# Patient Record
Sex: Male | Born: 1940 | Race: White | Hispanic: No | Marital: Married | Smoking: Former smoker
Health system: Southern US, Community
[De-identification: ages and names within clinical notes are randomized; demographics above are authoritative.]

## PROBLEM LIST (undated history)

## (undated) DIAGNOSIS — R569 Unspecified convulsions: Secondary | ICD-10-CM

## (undated) DIAGNOSIS — M549 Dorsalgia, unspecified: Secondary | ICD-10-CM

## (undated) DIAGNOSIS — I2510 Atherosclerotic heart disease of native coronary artery without angina pectoris: Secondary | ICD-10-CM

## (undated) DIAGNOSIS — I6529 Occlusion and stenosis of unspecified carotid artery: Secondary | ICD-10-CM

## (undated) DIAGNOSIS — G4700 Insomnia, unspecified: Secondary | ICD-10-CM

## (undated) DIAGNOSIS — N529 Male erectile dysfunction, unspecified: Secondary | ICD-10-CM

## (undated) DIAGNOSIS — F418 Other specified anxiety disorders: Secondary | ICD-10-CM

## (undated) DIAGNOSIS — N400 Enlarged prostate without lower urinary tract symptoms: Secondary | ICD-10-CM

## (undated) DIAGNOSIS — Z87891 Personal history of nicotine dependence: Secondary | ICD-10-CM

## (undated) DIAGNOSIS — G8929 Other chronic pain: Secondary | ICD-10-CM

## (undated) DIAGNOSIS — R918 Other nonspecific abnormal finding of lung field: Secondary | ICD-10-CM

## (undated) DIAGNOSIS — E785 Hyperlipidemia, unspecified: Secondary | ICD-10-CM

## (undated) DIAGNOSIS — R413 Other amnesia: Principal | ICD-10-CM

## (undated) DIAGNOSIS — R233 Spontaneous ecchymoses: Secondary | ICD-10-CM

## (undated) DIAGNOSIS — R238 Other skin changes: Secondary | ICD-10-CM

## (undated) DIAGNOSIS — M2550 Pain in unspecified joint: Secondary | ICD-10-CM

## (undated) HISTORY — DX: Male erectile dysfunction, unspecified: N52.9

## (undated) HISTORY — DX: Other nonspecific abnormal finding of lung field: R91.8

## (undated) HISTORY — PX: ADENOIDECTOMY: SUR15

## (undated) HISTORY — DX: Other amnesia: R41.3

## (undated) HISTORY — PX: COLONOSCOPY: SHX174

## (undated) HISTORY — PX: CARDIAC CATHETERIZATION: SHX172

## (undated) HISTORY — PX: OTHER SURGICAL HISTORY: SHX169

## (undated) HISTORY — DX: Occlusion and stenosis of unspecified carotid artery: I65.29

## (undated) HISTORY — PX: CERVICAL DISC SURGERY: SHX588

## (undated) HISTORY — PX: ANKLE FRACTURE SURGERY: SHX122

---

## 1999-06-13 ENCOUNTER — Encounter: Admission: RE | Admit: 1999-06-13 | Discharge: 1999-06-13 | Payer: Self-pay | Admitting: Family Medicine

## 1999-06-13 ENCOUNTER — Encounter: Payer: Self-pay | Admitting: Family Medicine

## 1999-07-29 ENCOUNTER — Emergency Department (HOSPITAL_COMMUNITY): Admission: EM | Admit: 1999-07-29 | Discharge: 1999-07-29 | Payer: Self-pay | Admitting: *Deleted

## 1999-08-10 ENCOUNTER — Encounter: Payer: Self-pay | Admitting: Neurology

## 1999-08-10 ENCOUNTER — Ambulatory Visit (HOSPITAL_COMMUNITY): Admission: RE | Admit: 1999-08-10 | Discharge: 1999-08-10 | Payer: Self-pay | Admitting: Neurology

## 2001-04-09 ENCOUNTER — Encounter: Payer: Self-pay | Admitting: Family Medicine

## 2001-04-09 LAB — CONVERTED CEMR LAB

## 2005-01-02 ENCOUNTER — Ambulatory Visit: Payer: Self-pay | Admitting: Family Medicine

## 2005-01-02 ENCOUNTER — Ambulatory Visit (HOSPITAL_COMMUNITY): Admission: RE | Admit: 2005-01-02 | Discharge: 2005-01-02 | Payer: Self-pay | Admitting: Family Medicine

## 2005-05-31 ENCOUNTER — Ambulatory Visit: Payer: Self-pay | Admitting: Family Medicine

## 2007-01-17 ENCOUNTER — Encounter: Payer: Self-pay | Admitting: Family Medicine

## 2007-01-17 DIAGNOSIS — F329 Major depressive disorder, single episode, unspecified: Secondary | ICD-10-CM

## 2007-08-20 ENCOUNTER — Ambulatory Visit (HOSPITAL_COMMUNITY): Admission: RE | Admit: 2007-08-20 | Discharge: 2007-08-20 | Payer: Self-pay | Admitting: Neurological Surgery

## 2007-08-20 HISTORY — PX: OTHER SURGICAL HISTORY: SHX169

## 2007-09-23 ENCOUNTER — Encounter: Admission: RE | Admit: 2007-09-23 | Discharge: 2007-09-23 | Payer: Self-pay | Admitting: Neurological Surgery

## 2007-11-21 ENCOUNTER — Encounter: Admission: RE | Admit: 2007-11-21 | Discharge: 2007-11-21 | Payer: Self-pay | Admitting: Neurological Surgery

## 2007-12-26 ENCOUNTER — Ambulatory Visit: Payer: Self-pay | Admitting: Family Medicine

## 2007-12-26 LAB — CONVERTED CEMR LAB
ALT: 18 units/L (ref 0–53)
AST: 27 units/L (ref 0–37)
Albumin: 3.8 g/dL (ref 3.5–5.2)
Alkaline Phosphatase: 61 units/L (ref 39–117)
BUN: 10 mg/dL (ref 6–23)
Basophils Relative: 1.1 % (ref 0.0–3.0)
Chloride: 113 meq/L — ABNORMAL HIGH (ref 96–112)
Creatinine, Ser: 0.9 mg/dL (ref 0.4–1.5)
Eosinophils Relative: 5.6 % — ABNORMAL HIGH (ref 0.0–5.0)
Glucose, Bld: 74 mg/dL (ref 70–99)
HCT: 45 % (ref 39.0–52.0)
HDL: 32.8 mg/dL — ABNORMAL LOW (ref >39.0)
Monocytes Relative: 10 % (ref 3.0–12.0)
Neutrophils Relative %: 49.6 % (ref 43.0–77.0)
Nitrite: NEGATIVE
Platelets: 206 10*3/uL (ref 150–400)
Potassium: 3.3 meq/L — ABNORMAL LOW (ref 3.5–5.1)
RBC: 4.45 M/uL (ref 4.22–5.81)
Specific Gravity, Urine: 1.025
Total CHOL/HDL Ratio: 6.2
Total Protein: 7.2 g/dL (ref 6.0–8.3)
Triglycerides: 79 mg/dL (ref 0–149)
Urobilinogen, UA: 1
WBC Urine, dipstick: NEGATIVE
WBC: 6.6 10*3/uL (ref 4.5–10.5)

## 2008-01-05 ENCOUNTER — Ambulatory Visit: Payer: Self-pay | Admitting: Family Medicine

## 2008-01-05 DIAGNOSIS — F528 Other sexual dysfunction not due to a substance or known physiological condition: Secondary | ICD-10-CM

## 2008-01-06 ENCOUNTER — Telehealth: Payer: Self-pay | Admitting: *Deleted

## 2008-01-14 ENCOUNTER — Telehealth: Payer: Self-pay | Admitting: *Deleted

## 2008-01-20 ENCOUNTER — Ambulatory Visit: Payer: Self-pay | Admitting: Family Medicine

## 2008-01-30 ENCOUNTER — Telehealth (INDEPENDENT_AMBULATORY_CARE_PROVIDER_SITE_OTHER): Payer: Self-pay | Admitting: *Deleted

## 2008-02-10 ENCOUNTER — Ambulatory Visit: Payer: Self-pay | Admitting: Internal Medicine

## 2008-02-23 ENCOUNTER — Encounter: Admission: RE | Admit: 2008-02-23 | Discharge: 2008-02-23 | Payer: Self-pay | Admitting: Neurological Surgery

## 2008-02-26 ENCOUNTER — Telehealth: Payer: Self-pay | Admitting: Internal Medicine

## 2008-02-27 ENCOUNTER — Ambulatory Visit: Payer: Self-pay | Admitting: Internal Medicine

## 2008-04-15 ENCOUNTER — Telehealth: Payer: Self-pay | Admitting: *Deleted

## 2008-11-02 ENCOUNTER — Ambulatory Visit: Payer: Self-pay | Admitting: Family Medicine

## 2008-11-02 DIAGNOSIS — R5381 Other malaise: Secondary | ICD-10-CM

## 2008-11-02 DIAGNOSIS — R5383 Other fatigue: Secondary | ICD-10-CM

## 2008-11-02 DIAGNOSIS — K4090 Unilateral inguinal hernia, without obstruction or gangrene, not specified as recurrent: Secondary | ICD-10-CM | POA: Insufficient documentation

## 2008-11-03 LAB — CONVERTED CEMR LAB
Albumin: 4.2 g/dL (ref 3.5–5.2)
Alkaline Phosphatase: 63 units/L (ref 39–117)
BUN: 9 mg/dL (ref 6–23)
Basophils Absolute: 0 10*3/uL (ref 0.0–0.1)
CO2: 27 meq/L (ref 19–32)
Calcium: 9.3 mg/dL (ref 8.4–10.5)
Creatinine, Ser: 0.9 mg/dL (ref 0.4–1.5)
Eosinophils Absolute: 0.3 10*3/uL (ref 0.0–0.7)
Glucose, Bld: 83 mg/dL (ref 70–99)
HDL: 29.2 mg/dL — ABNORMAL LOW (ref >39.00)
Hemoglobin: 17 g/dL (ref 13.0–17.0)
Lymphocytes Relative: 47.3 % — ABNORMAL HIGH (ref 12.0–46.0)
MCHC: 34.9 g/dL (ref 30.0–36.0)
Neutro Abs: 2 10*3/uL (ref 1.4–7.7)
Neutrophils Relative %: 36.3 % — ABNORMAL LOW (ref 43.0–77.0)
Platelets: 183 10*3/uL (ref 150.0–400.0)
RDW: 12.5 % (ref 11.5–14.6)
Saturation Ratios: 50.6 % — ABNORMAL HIGH (ref 20.0–50.0)
T3, Free: 2.9 pg/mL (ref 2.3–4.2)
TSH: 4.26 microintl units/mL (ref 0.35–5.50)
Total CHOL/HDL Ratio: 6
Transferrin: 220.3 mg/dL (ref 212.0–360.0)
Triglycerides: 153 mg/dL — ABNORMAL HIGH (ref 0.0–149.0)

## 2008-12-24 ENCOUNTER — Ambulatory Visit (HOSPITAL_COMMUNITY): Admission: RE | Admit: 2008-12-24 | Discharge: 2008-12-24 | Payer: Self-pay | Admitting: General Surgery

## 2008-12-24 HISTORY — PX: OTHER SURGICAL HISTORY: SHX169

## 2009-04-13 ENCOUNTER — Ambulatory Visit: Payer: Self-pay | Admitting: Family Medicine

## 2009-04-13 LAB — CONVERTED CEMR LAB
Glucose, Urine, Semiquant: NEGATIVE
Specific Gravity, Urine: 1.025
WBC Urine, dipstick: NEGATIVE
pH: 5.5

## 2009-04-14 DIAGNOSIS — R4182 Altered mental status, unspecified: Secondary | ICD-10-CM | POA: Insufficient documentation

## 2009-04-14 LAB — CONVERTED CEMR LAB
ALT: 24 units/L (ref 0–53)
Alkaline Phosphatase: 59 units/L (ref 39–117)
Basophils Relative: 0.4 % (ref 0.0–3.0)
Bilirubin, Direct: 0.1 mg/dL (ref 0.0–0.3)
Calcium: 9.8 mg/dL (ref 8.4–10.5)
Creatinine, Ser: 1.1 mg/dL (ref 0.4–1.5)
Eosinophils Absolute: 0.1 10*3/uL (ref 0.0–0.7)
Eosinophils Relative: 2.4 % (ref 0.0–5.0)
Folate: 19.3 ng/mL
Lymphocytes Relative: 33.7 % (ref 12.0–46.0)
Neutrophils Relative %: 52.9 % (ref 43.0–77.0)
PSA: 0.63 ng/mL (ref 0.10–4.00)
RBC: 4.99 M/uL (ref 4.22–5.81)
Saturation Ratios: 51.9 % — ABNORMAL HIGH (ref 20.0–50.0)
T3, Free: 2.8 pg/mL (ref 2.3–4.2)
Total Protein: 8.6 g/dL — ABNORMAL HIGH (ref 6.0–8.3)
WBC: 5.3 10*3/uL (ref 4.5–10.5)

## 2009-04-15 ENCOUNTER — Ambulatory Visit: Payer: Self-pay | Admitting: Family Medicine

## 2009-04-15 DIAGNOSIS — E538 Deficiency of other specified B group vitamins: Secondary | ICD-10-CM | POA: Insufficient documentation

## 2009-04-22 ENCOUNTER — Encounter: Admission: RE | Admit: 2009-04-22 | Discharge: 2009-04-22 | Payer: Self-pay | Admitting: Family Medicine

## 2009-05-05 ENCOUNTER — Encounter: Payer: Self-pay | Admitting: Family Medicine

## 2009-05-19 ENCOUNTER — Ambulatory Visit: Payer: Self-pay | Admitting: Family Medicine

## 2009-05-23 ENCOUNTER — Ambulatory Visit: Payer: Self-pay | Admitting: Psychology

## 2009-05-24 ENCOUNTER — Ambulatory Visit: Payer: Self-pay | Admitting: Family Medicine

## 2009-10-18 ENCOUNTER — Encounter: Admission: RE | Admit: 2009-10-18 | Discharge: 2009-10-18 | Payer: Self-pay | Admitting: Neurological Surgery

## 2009-10-18 ENCOUNTER — Encounter: Payer: Self-pay | Admitting: Family Medicine

## 2010-03-15 ENCOUNTER — Encounter
Admission: RE | Admit: 2010-03-15 | Discharge: 2010-03-15 | Payer: Self-pay | Source: Home / Self Care | Admitting: Physical Medicine and Rehabilitation

## 2010-03-24 ENCOUNTER — Telehealth: Payer: Self-pay | Admitting: Family Medicine

## 2010-03-27 ENCOUNTER — Ambulatory Visit (HOSPITAL_COMMUNITY)
Admission: RE | Admit: 2010-03-27 | Discharge: 2010-03-27 | Payer: Self-pay | Source: Home / Self Care | Attending: Physical Medicine and Rehabilitation | Admitting: Physical Medicine and Rehabilitation

## 2010-03-27 ENCOUNTER — Ambulatory Visit: Payer: Self-pay | Admitting: Family Medicine

## 2010-03-27 ENCOUNTER — Encounter: Payer: Self-pay | Admitting: Family Medicine

## 2010-04-04 ENCOUNTER — Encounter
Admission: RE | Admit: 2010-04-04 | Discharge: 2010-04-04 | Payer: Self-pay | Source: Home / Self Care | Attending: Physical Medicine and Rehabilitation | Admitting: Physical Medicine and Rehabilitation

## 2010-04-14 ENCOUNTER — Telehealth: Payer: Self-pay | Admitting: Family Medicine

## 2010-04-19 ENCOUNTER — Ambulatory Visit: Payer: Self-pay | Admitting: Oncology

## 2010-05-02 ENCOUNTER — Ambulatory Visit: Admit: 2010-05-02 | Payer: Self-pay | Admitting: Family Medicine

## 2010-05-02 ENCOUNTER — Inpatient Hospital Stay (HOSPITAL_COMMUNITY)
Admission: EM | Admit: 2010-05-02 | Discharge: 2010-05-08 | Payer: Self-pay | Source: Home / Self Care | Attending: Internal Medicine | Admitting: Internal Medicine

## 2010-05-03 LAB — CBC
Hemoglobin: 13.6 g/dL (ref 13.0–17.0)
MCH: 32.8 pg (ref 26.0–34.0)
MCH: 32.7 pg (ref 26.0–34.0)
MCHC: 35 g/dL (ref 30.0–36.0)
MCV: 93.2 fL (ref 78.0–100.0)
MCV: 93.5 fL (ref 78.0–100.0)
Platelets: 226 10*3/uL (ref 150–400)
RBC: 4.55 MIL/uL (ref 4.22–5.81)
RBC: 4.27 MIL/uL (ref 4.22–5.81)
RBC: 4.16 MIL/uL — ABNORMAL LOW (ref 4.22–5.81)
RDW: 13.5 % (ref 11.5–15.5)
WBC: 13.5 10*3/uL — ABNORMAL HIGH (ref 4.0–10.5)
WBC: 14.1 10*3/uL — ABNORMAL HIGH (ref 4.0–10.5)

## 2010-05-03 LAB — COMPREHENSIVE METABOLIC PANEL
ALT: 15 U/L (ref 0–53)
AST: 22 U/L (ref 0–37)
AST: 26 U/L (ref 0–37)
Albumin: 3 g/dL — ABNORMAL LOW (ref 3.5–5.2)
BUN: 9 mg/dL (ref 6–23)
Calcium: 9.4 mg/dL (ref 8.4–10.5)
Calcium: 9 mg/dL (ref 8.4–10.5)
Chloride: 102 mEq/L (ref 96–112)
Creatinine, Ser: 1.09 mg/dL (ref 0.4–1.5)
Creatinine, Ser: 0.98 mg/dL (ref 0.4–1.5)
GFR calc Af Amer: >60 mL/min (ref >60)
GFR calc non Af Amer: >60 mL/min (ref >60)
Glucose, Bld: 111 mg/dL — ABNORMAL HIGH (ref 70–99)
Potassium: 3.7 mEq/L (ref 3.5–5.1)
Sodium: 135 mEq/L (ref 135–145)
Total Bilirubin: 1.7 mg/dL — ABNORMAL HIGH (ref 0.3–1.2)
Total Protein: 7.5 g/dL (ref 6.0–8.3)

## 2010-05-03 LAB — BLOOD GAS, VENOUS
Acid-base deficit: 1.9 mmol/L (ref 0.0–2.0)
Bicarbonate: 20.2 mEq/L (ref 20.0–24.0)
FIO2: 0.21 %
O2 Saturation: 90 %
Patient temperature: 98.6
TCO2: 17.6 mmol/L (ref 0–100)
pO2, Ven: 55 mmHg — ABNORMAL HIGH (ref 30.0–45.0)

## 2010-05-03 LAB — DIFFERENTIAL
Basophils Absolute: 0 10*3/uL (ref 0.0–0.1)
Basophils Absolute: 0 10*3/uL (ref 0.0–0.1)
Basophils Relative: 0 % (ref 0–1)
Eosinophils Absolute: 0 10*3/uL (ref 0.0–0.7)
Eosinophils Relative: 0 % (ref 0–5)
Monocytes Absolute: 1.1 10*3/uL — ABNORMAL HIGH (ref 0.1–1.0)
Monocytes Absolute: 1.1 10*3/uL — ABNORMAL HIGH (ref 0.1–1.0)
Monocytes Relative: 8 % (ref 3–12)
Monocytes Relative: 8 % (ref 3–12)
Neutro Abs: 11.8 10*3/uL — ABNORMAL HIGH (ref 1.7–7.7)
Neutrophils Relative %: 83 % — ABNORMAL HIGH (ref 43–77)
Neutrophils Relative %: 84 % — ABNORMAL HIGH (ref 43–77)

## 2010-05-03 LAB — URINALYSIS, ROUTINE W REFLEX MICROSCOPIC
Bilirubin Urine: NEGATIVE
Hgb urine dipstick: NEGATIVE
Ketones, ur: >80 mg/dL — AB
Specific Gravity, Urine: 1.019 (ref 1.005–1.030)
Urine Glucose, Fasting: NEGATIVE mg/dL
pH: 6 (ref 5.0–8.0)

## 2010-05-03 LAB — POCT I-STAT, CHEM 8
BUN: 7 mg/dL (ref 6–23)
Chloride: 108 mEq/L (ref 96–112)
Glucose, Bld: 116 mg/dL — ABNORMAL HIGH (ref 70–99)
HCT: 42 % (ref 39.0–52.0)
Hemoglobin: 14.3 g/dL (ref 13.0–17.0)

## 2010-05-04 HISTORY — PX: OTHER SURGICAL HISTORY: SHX169

## 2010-05-04 LAB — MAGNESIUM: Magnesium: 2 mg/dL (ref 1.5–2.5)

## 2010-05-04 LAB — COMPREHENSIVE METABOLIC PANEL
Alkaline Phosphatase: 65 U/L (ref 39–117)
BUN: 4 mg/dL — ABNORMAL LOW (ref 6–23)
CO2: 27 mEq/L (ref 19–32)
Calcium: 8.8 mg/dL (ref 8.4–10.5)
GFR calc non Af Amer: >60 mL/min (ref >60)
Glucose, Bld: 97 mg/dL (ref 70–99)
Total Protein: 6.9 g/dL (ref 6.0–8.3)

## 2010-05-04 LAB — DIFFERENTIAL
Basophils Absolute: 0 10*3/uL (ref 0.0–0.1)
Basophils Relative: 0 % (ref 0–1)
Eosinophils Relative: 1 % (ref 0–5)
Lymphocytes Relative: 15 % (ref 12–46)
Lymphs Abs: 1.9 10*3/uL (ref 0.7–4.0)
Monocytes Relative: 6 % (ref 3–12)
Neutro Abs: 9.4 10*3/uL — ABNORMAL HIGH (ref 1.7–7.7)
Neutrophils Relative %: 77 % (ref 43–77)

## 2010-05-04 LAB — CBC
HCT: 38.3 % — ABNORMAL LOW (ref 39.0–52.0)
Hemoglobin: 13.3 g/dL (ref 13.0–17.0)
MCHC: 34.7 g/dL (ref 30.0–36.0)
MCV: 93.2 fL (ref 78.0–100.0)
RDW: 13.8 % (ref 11.5–15.5)

## 2010-05-04 LAB — PHOSPHORUS: Phosphorus: 2.9 mg/dL (ref 2.3–4.6)

## 2010-05-05 LAB — BASIC METABOLIC PANEL
CO2: 27 mEq/L (ref 19–32)
Calcium: 8.6 mg/dL (ref 8.4–10.5)
Creatinine, Ser: 0.86 mg/dL (ref 0.4–1.5)
GFR calc Af Amer: >60 mL/min (ref >60)
GFR calc non Af Amer: >60 mL/min (ref >60)
Sodium: 143 mEq/L (ref 135–145)

## 2010-05-05 LAB — DIFFERENTIAL
Basophils Absolute: 0 10*3/uL (ref 0.0–0.1)
Basophils Relative: 0 % (ref 0–1)
Eosinophils Absolute: 0.4 10*3/uL (ref 0.0–0.7)
Monocytes Absolute: 0.6 10*3/uL (ref 0.1–1.0)
Monocytes Relative: 8 % (ref 3–12)
Neutro Abs: 5.8 10*3/uL (ref 1.7–7.7)

## 2010-05-05 LAB — CBC
Hemoglobin: 12.6 g/dL — ABNORMAL LOW (ref 13.0–17.0)
MCH: 32.5 pg (ref 26.0–34.0)
MCHC: 34.8 g/dL (ref 30.0–36.0)
Platelets: 216 10*3/uL (ref 150–400)
RDW: 14 % (ref 11.5–15.5)

## 2010-05-05 LAB — WOUND CULTURE

## 2010-05-05 LAB — VANCOMYCIN, TROUGH: Vancomycin Tr: 13.2 ug/mL (ref 10.0–20.0)

## 2010-05-05 LAB — MAGNESIUM: Magnesium: 2 mg/dL (ref 1.5–2.5)

## 2010-05-05 LAB — PHOSPHORUS: Phosphorus: 2.7 mg/dL (ref 2.3–4.6)

## 2010-05-06 LAB — CBC
Hemoglobin: 12.9 g/dL — ABNORMAL LOW (ref 13.0–17.0)
Platelets: 250 10*3/uL (ref 150–400)
RBC: 4.02 MIL/uL — ABNORMAL LOW (ref 4.22–5.81)
WBC: 6 10*3/uL (ref 4.0–10.5)

## 2010-05-06 LAB — DIFFERENTIAL
Basophils Absolute: 0 10*3/uL (ref 0.0–0.1)
Basophils Relative: 1 % (ref 0–1)
Eosinophils Absolute: 0.4 10*3/uL (ref 0.0–0.7)
Neutro Abs: 2.8 10*3/uL (ref 1.7–7.7)
Neutrophils Relative %: 47 % (ref 43–77)

## 2010-05-06 LAB — BASIC METABOLIC PANEL
CO2: 28 mEq/L (ref 19–32)
Chloride: 109 mEq/L (ref 96–112)
GFR calc Af Amer: >60 mL/min (ref >60)
Sodium: 144 mEq/L (ref 135–145)

## 2010-05-06 LAB — PHOSPHORUS: Phosphorus: 2.8 mg/dL (ref 2.3–4.6)

## 2010-05-06 LAB — MAGNESIUM: Magnesium: 2.1 mg/dL (ref 1.5–2.5)

## 2010-05-07 LAB — CBC
MCV: 91.8 fL (ref 78.0–100.0)
Platelets: 271 10*3/uL (ref 150–400)
RBC: 4.04 MIL/uL — ABNORMAL LOW (ref 4.22–5.81)
RDW: 13.8 % (ref 11.5–15.5)
WBC: 6.3 10*3/uL (ref 4.0–10.5)

## 2010-05-07 LAB — CONVERTED CEMR LAB
Beta Globulin: 5.7 % (ref 4.7–7.2)
Bilirubin, Direct: 0.1 mg/dL (ref 0.0–0.3)
Folate: 13.3 ng/mL
Gamma Globulin: 22.5 % — ABNORMAL HIGH (ref 11.1–18.8)
Iron: 73 ug/dL (ref 42–165)
Total Protein, Serum Electrophoresis: 8.1 g/dL (ref 6.0–8.3)
Total Protein: 7.8 g/dL (ref 6.0–8.3)
Vitamin B-12: 669 pg/mL (ref 211–911)

## 2010-05-07 LAB — DIFFERENTIAL
Basophils Absolute: 0.1 10*3/uL (ref 0.0–0.1)
Eosinophils Absolute: 0.3 10*3/uL (ref 0.0–0.7)
Lymphocytes Relative: 34 % (ref 12–46)
Monocytes Absolute: 0.9 10*3/uL (ref 0.1–1.0)
Neutrophils Relative %: 46 % (ref 43–77)

## 2010-05-07 LAB — WOUND CULTURE: Culture: NO GROWTH

## 2010-05-07 LAB — BASIC METABOLIC PANEL
BUN: 1 mg/dL — ABNORMAL LOW (ref 6–23)
Chloride: 106 mEq/L (ref 96–112)
GFR calc Af Amer: >60 mL/min (ref >60)
GFR calc non Af Amer: >60 mL/min (ref >60)
Potassium: 2.8 mEq/L — ABNORMAL LOW (ref 3.5–5.1)

## 2010-05-07 LAB — MAGNESIUM: Magnesium: 2 mg/dL (ref 1.5–2.5)

## 2010-05-07 LAB — PHOSPHORUS: Phosphorus: 3.1 mg/dL (ref 2.3–4.6)

## 2010-05-08 LAB — BASIC METABOLIC PANEL
BUN: 2 mg/dL — ABNORMAL LOW (ref 6–23)
Calcium: 8.9 mg/dL (ref 8.4–10.5)
GFR calc non Af Amer: >60 mL/min (ref >60)
Glucose, Bld: 103 mg/dL — ABNORMAL HIGH (ref 70–99)
Potassium: 3.4 mEq/L — ABNORMAL LOW (ref 3.5–5.1)
Sodium: 141 mEq/L (ref 135–145)

## 2010-05-08 NOTE — Discharge Summary (Signed)
NAME:  Tyler Mayo, Tyler Mayo NO.:  [id number]  MEDICAL RECORD NO.:  [id number]          PATIENT TYPE:  INP  LOCATION:  1412                         FACILITY:  [facility] ([place])  PHYSICIAN:  Rock Nephew, MD       DATE OF BIRTH:  1940-12-05  DATE OF ADMISSION:  05/02/2010 DATE OF DISCHARGE:  05/08/2010                        DISCHARGE SUMMARY - REFERRING   PRIMARY CARE PHYSICIAN:  Tinnie Gens A. Tawanna Cooler, MD.  DISCHARGE DIAGNOSES:  Right thigh abscess status post incision and drainage, resolved systemic inflammatory response syndrome with secondary infection, Vertebral fracture, anxiety and depression, delirium with metabolic encephalopathy resolved secondary to SIRS, history of TIA discharged on a baby aspirin.  DISCHARGE MEDICATIONS:  For the patient are as follows: 1. Aspirin 81 mg p.o. q. daily. 2. Bactrim double-strength 1 tablet p.o. twice daily for 7 days. 3. Vicodin 5/500 mg 1 tablet by mouth every 8 hours as needed pain. 4. Klonopin 0.5 mg 1 tablet by mouth 3 times a day. 5. Multivitamins 1 tablet p.o. q. daily. 6. Nuedexta 1 tablet by mouth daily. 7. Sleep aid. 8. Diphenhydramine 50 mg 1 tablet by mouth daily at bedtime. 9. Visine lubricant eye drop, 1 drop in both eyes as needed.  DISPOSITION:  The patient is discharged home.  DIET:  Patient's diet is regular.  FOLLOWUP:  The patient should follow up with Dr. Kelle Darting within 1-2 weeks.  The patient should also follow up with Dr. Emelia Loron within 1 week.  The patient has home health nurse set up for dressing changes.  CONSULTATIONS:  Consultations on this case were Dr. Emelia Loron of [facility].  PROCEDURES PERFORMED:  He had an incision and drainage of the right thigh abscess performed by Dr. Kelle Darting on May 04, 2010. Patient has also had a CT scan of the lower extremity which showed edematous changes of subcutaneous fat in the anterior medial right upper thigh, a few air  bubbles in the region, no apparent drainable fluid collection or abscess; the appearance is more that of phlegmonous inflammation.  Patient also did CT scan of the pelvis which showed no acute finding in the pelvis.  INITIAL HISTORY AND PHYSICAL:  Chief complaint of right thigh swelling, pain, and drainage.  This is a 70 year old male brought in by his wife on May 02, 2010, with a temperature of 102.3 and his draining abscess in the right upper thigh.  The patient seemed confused, unable to give adequate history as to when it started, but the wife states that she noted it this morning.  He apparently tried to do an I and D for himself at home and he has a big wound draining foul-smelling purulent material.  HOSPITAL COURSE: 1. Right thigh abscess status post I and D.  The patient was placed     initially on vancomycin, Zosyn.  The patient had consultation by     [facility].  Dr. Dwain Sarna took the patient to the     operating room on May 04, 2010.  He performed incision and     drainage.  There was also some cystic material in there.  The cyst     was apparently sent to Pathology, also, per Dr. Doreen Salvage     dictation.  Later, the vancomycin was tapered off, as all the     cultures came back negative.  The patient was on IV Zosyn during     the hospitalization.  The patient was on Bactrim for an additional     7 days. 2. SIRS.  The patient initially came in, was meeting criteria for     systemic inflammatory response syndrome.  This was most likely     secondary to the thigh infection that the patient has been having.     The SIRS has since resolved and the patient has been off IV fluids     since May 06, 2010. 3. History of vertebral fracture.  Chronic old infiltration. 4. Anxiety, depression.  Patient is experiencing anxiety, depression.     He takes Nuedexta post Klonopin at home. 5. Delirium, metabolic encephalopathy.  Patient was a little bit      confused initially and this was most likely related to the SIRS and     the infection, causing a metabolic encephalopathy for the patient.     The delirium and the metabolic encephalopathy have since resolved. 6. History of TIA.  The patient has a remote history of having a TIA.     Patient should be placed on a baby aspirin daily. 7. DVT prophylaxis.  The patient received heparin drip for DVT     prophylaxis.  Please note that this is not an official document until electronically signed.     Rock Nephew, MD     NH/MEDQ  D:  05/08/2010  T:  05/08/2010  Job:  [id number]  cc:   Tinnie Gens A. Tawanna Cooler, MD [address] [place] [address]  Juanetta Gosling, MD [address] [place] [address]  Electronically Signed by Rock Nephew MD on 05/08/2010 04:54:59 PM

## 2010-05-08 NOTE — Consult Note (Signed)
NAME:  Tyler Mayo, Tyler Mayo NO.:  [id number]  MEDICAL RECORD NO.:  [id number]          PATIENT TYPE:  INP  LOCATION:  1412                         FACILITY:  [facility]  PHYSICIAN:  Juanetta Gosling, MDDATE OF BIRTH:  21-May-1940  DATE OF CONSULTATION:  05/03/2010 DATE OF DISCHARGE:                                CONSULTATION   HISTORY OF PRESENT ILLNESS:  This is a 69-year male who is well known to me for right inguinal hernia in the past.  It is difficult to obtain history from right now as he has had some depression that has occurred and he does seem confused. We obtained some of the history from his family who was in the room as well as from his chart.  Apparently, he has had a couple of days of right lower extremity pain and swelling in his thigh that he apparently tried to drain himself at home.  He had a temperature of 102.3 and had pain in this area and was brought to the Emergency Room.  This area had been draining on its own, he was admitted by the Hospitalist Service.  They did not see anything minimal to drainage at this point and wrote that there was no need for a surgical consult at this point since it was already drained.  He did undergo a CT scan of the Emergency Room of his pelvis that was negative as well as his right hip and thigh which showed mild edema to the subcutaneous fat and anteromedial right thigh with some air gas bubbles as well as some adjacent reactive nodes that were present, but there is no apparent drainable collection that is there.  We were asked to see him today. Apparently, it was thought that there might be a need to be drained and the cellulitis seems to be potentially extending little bit.  He was mildly hypertensive and tachycardic according to hospitalist in the emergency room but this is now improved.  He has no prior history of these.  PAST MEDICAL HISTORY: 1. Significant for TIA. 2. Anxiety/depression.  PAST SURGICAL  HISTORY:  Right inguinal hernia repair by me as well as an ACDF by the neurosurgeons.  ALLERGIES:  None known.  MEDICATIONS: 1. Klonopin. 2. Nuedexta. 3. Ultram. 4. Benadryl.  SOCIAL HISTORY:  He is married, lives in [place].  No history of tobacco or alcohol.  His review of systems otherwise negative.  PHYSICAL EXAMINATION:  VITAL SIGNS:  Today, his vital signs are T-max today is 100.0, it is 99.8 now, pulses 101, blood pressure 111/70, respirations 18, sats are 97 on room air. GENERAL:  He is a mildly confused well-appearing male in no distress. EXTREMITIES:  His right upper thigh shows a fairly large area of cellulitis.  There is no fluctuant area that I can identify.  He has got about 2 x 2 cm hole in this that has some necrotic tissue present, but nothing really is draining from this currently.  This does not extend into his perineum or involve his penis or his scrotum.  This is clearly just involving his upper thigh and his leg is neurovascularly intact.  His other laboratory evaluation today, his wound culture shows gram- negative rods, rare gram-positive cocci.  His CMP today is normal except for a glucose of 111 and bilirubin of 1.7 and albumin of 3.  His CBC shows a white count of 16.3, hematocrit 38.9, platelets of 225.  ASSESSMENT:  Right lower extremity cellulitis with possible abscess.  PLAN:  He just ate prior to coming here today.  I do not really identify possible an area to drain on this right now.  This correlates with the scan.  I told him that I would give him additional 12 hours overnight. I will make him n.p.o. after midnight.  If this area is not improved by tomorrow or worsens, then he is going to need to the operating room in an attempt to open this area wider, although I do not debride, but I think it is reasonable to give him overnight as he has not gotten better since he has been here.  I discussed this with he and his family.     Juanetta Gosling, MD     MCW/MEDQ  D:  05/03/2010  T:  05/03/2010  Job:  [id number]  cc:   Tinnie Gens A. Tawanna Cooler, MD [address] [place] [address]  Electronically Signed by Emelia Loron MD on 05/08/2010 11:31:10 AM

## 2010-05-08 NOTE — Op Note (Signed)
  NAME:  Tyler Mayo, Tyler Mayo NO.:  [id number]  MEDICAL RECORD NO.:  [id number]          PATIENT TYPE:  INP  LOCATION:  1412                         FACILITY:  [facility]  PHYSICIAN:  Juanetta Gosling, MDDATE OF BIRTH:  22-Apr-1940  DATE OF PROCEDURE:  05/04/2010 DATE OF DISCHARGE:                              OPERATIVE REPORT   PREOPERATIVE DIAGNOSIS:  Right thigh abscess.  POSTOPERATIVE DIAGNOSIS:  Right thigh abscess.  PROCEDURE:  Right thigh abscess incision and drainage.  SURGEON:  Emelia Loron, MD  ASSISTANT:  None.  ANESTHESIA:  General.  ANESTHESIOLOGIST:  Jenelle Mages. Fortune, M.D.  SPECIMENS:  Right thigh likely cyst wall.  ESTIMATED BLOOD LOSS:  Minimal.  COMPLICATIONS:  None.  DRAINS:  None.  DISPOSITION:  To recovery room in a stable condition.  INDICATIONS FOR PROCEDURE:  [name] is a 70 year old male known to me from an inguinal hernia repair who presented and was admitted by the hospitalist on May 02, 2010.  They called [place] the following evening after an area of cellulitis and a possible abscess did not improve.  I discussed with him incising and draining this area in the operating room.  He had just eaten so we waited until the following day to do this.  DESCRIPTION OF PROCEDURE:  After informed consent was obtained, the patient was taken to the operating room.  He was being administered vancomycin and Zosyn on the floor.  He had surgical compression devices placed on his lower extremity prior to the induction of anesthesia.  He was then placed under general endotracheal anesthesia without complication.  His groin was then prepped and draped in the standard sterile surgical fashion.  A surgical timeout was then performed.  I then opened this incision further in an elliptical fashion and then transversely.  There was still a fair amount of purulence laterally which I did cultures of and sent those off.  I opened this up.  I  felt that there was a little bit of a cyst wall present medially, and I did remove as much of this as I could locate and sent this to pathology. Irrigation was performed.  This was all clean.  I then packed this with wet normal saline soaked gauze.  A sterile dressing was placed over this.  He tolerated this well and was extubated in the operating room and transferred to the recovery room in a stable condition.     Juanetta Gosling, MD     MCW/MEDQ  D:  05/04/2010  T:  05/04/2010  Job:  [id number]  cc:   Tinnie Gens A. Tawanna Cooler, MD [address] [place] [address]  Electronically Signed by Emelia Loron MD on 05/08/2010 11:31:16 AM

## 2010-05-09 LAB — CULTURE, BLOOD (ROUTINE X 2)
Culture  Setup Time: 201201250014
Culture: NO GROWTH

## 2010-05-09 LAB — ANAEROBIC CULTURE

## 2010-05-11 NOTE — Letter (Signed)
Summary: [facility]   Imported By: Maryln Gottron 11/10/2009 12:39:14  _____________________________________________________________________  External Attachment:    Type:   Image     Comment:   External Document

## 2010-05-11 NOTE — Progress Notes (Signed)
Summary: bone density  Phone Note Call from Patient Call back at Home Phone [phone]   Caller: Patient--live call Reason for Call: Referral Summary of Call: was told to call for [facility] about getting a bone density test. Initial call taken by: Warnell Forester,  March 24, 2010 8:28 AM  Follow-up for Phone Call        scheduled Follow-up by: Kern Reap CMA Duncan Dull),  March 24, 2010 11:32 AM

## 2010-05-11 NOTE — Miscellaneous (Signed)
Summary: BONE DENSITY  Clinical Lists Changes  Orders: Added new Test order of T-Bone Densitometry (77080) - Signed Added new Test order of T-Lumbar Vertebral Assessment (77082) - Signed 

## 2010-05-11 NOTE — Assessment & Plan Note (Signed)
Summary: emp/pt coming in fasting/cjr   Vital Signs:  Patient profile:   70 year old male Height:      69.5 inches Weight:      212 pounds Temp:     97.3 degrees F oral BP sitting:   110 / 80  (left arm) Cuff size:   regular  Vitals Entered By: Kern Reap CMA Duncan Dull) (May 19, 2009 10:43 AM)  Reason for Visit cpx  History of Present Illness: Tyler Mayo is a 70 year old, married male, nonsmoker, who comes in today for a general physical examination.  We found 4 weeks ago that he had a B12 deficiency.  At that time his level was 197.  We started him on injectable B12 1 cc weekly.  His serum protein was slightly elevated 8.6.  Otherwise, labs were normal.  At that time.  He was having issues with mental confusion.  Hiis psyc.  stopped all his medication and today he says he feels so much better.  The neurologist was not able to make a diagnosis.  The neurologist referred him for psychiatric testing through a psychologist.  He's going to do this next week.  He gets routine eye care.  Dental care.  Colonoscopy normal and GI tetanus 2009 Pneumovax 2009 zoster vaccine 2009, forgot, seasonal flu   Allergies: No Known Drug Allergies  Past History:  Past medical, surgical, family and social histories (including risk factors) reviewed, and no changes noted (except as noted below).  Past Medical History: Reviewed history from 01/05/2008 and no changes required. Depression ETOH free X 15 yrs cervical disk surgery 3 level fusion with cadaver Maurine Minister June 2009 successful.  No complications erectile dysfunction  Past Surgical History: Reviewed history from 01/17/2007 and no changes required. Tonsillectomy, adenoidectomy ankle fx  Family History: Reviewed history from 01/05/2008 and no changes required. father died at 36, lung cancer smoker,alcoholic mother died 106, heart disease.  No brothers no sisters  Social History: Reviewed history from 01/05/2008 and no changes  required. Occupation: Married Former Smoker Alcohol use-no Drug use-no Regular exercise-yes  Review of Systems      See HPI  Physical Exam  General:  Well-developed,well-nourished,in no acute distress; alert,appropriate and cooperative throughout examination Head:  Normocephalic and atraumatic without obvious abnormalities. No apparent alopecia or balding. Eyes:  No corneal or conjunctival inflammation noted. EOMI. Perrla. Funduscopic exam benign, without hemorrhages, exudates or papilledema. Vision grossly normal. Ears:  External ear exam shows no significant lesions or deformities.  Otoscopic examination reveals clear canals, tympanic membranes are intact bilaterally without bulging, retraction, inflammation or discharge. Hearing is grossly normal bilaterally. Nose:  External nasal examination shows no deformity or inflammation. Nasal mucosa are pink and moist without lesions or exudates. Mouth:  Oral mucosa and oropharynx without lesions or exudates.  Teeth in good repair. Neck:  No deformities, masses, or tenderness noted. Chest Wall:  No deformities, masses, tenderness or gynecomastia noted. Breasts:  No masses or gynecomastia noted Lungs:  Normal respiratory effort, chest expands symmetrically. Lungs are clear to auscultation, no crackles or wheezes. Heart:  Normal rate and regular rhythm. S1 and S2 normal without gallop, murmur, click, rub or other extra sounds. Abdomen:  Bowel sounds positive,abdomen soft and non-tender without masses, organomegaly or hernias noted. Rectal:  No external abnormalities noted. Normal sphincter tone. No rectal masses or tenderness. Genitalia:  Testes bilaterally descended without nodularity, tenderness or masses. No scrotal masses or lesions. No penis lesions or urethral discharge. Prostate:  Prostate gland firm and smooth, no  enlargement, nodularity, tenderness, mass, asymmetry or induration. Msk:  No deformity or scoliosis noted of thoracic or  lumbar spine.   Pulses:  R and L carotid,radial,femoral,dorsalis pedis and posterior tibial pulses are full and equal bilaterally Extremities:  No clubbing, cyanosis, edema, or deformity noted with normal full range of motion of all joints.   Neurologic:  No cranial nerve deficits noted. Station and gait are normal. Plantar reflexes are down-going bilaterally. DTRs are symmetrical throughout. Sensory, motor and coordinative functions appear intact. Skin:  Intact without suspicious lesions or rashes Cervical Nodes:  No lymphadenopathy noted Axillary Nodes:  No palpable lymphadenopathy Inguinal Nodes:  No significant adenopathy Psych:  Cognition and judgment appear intact. Alert and cooperative with normal attention span and concentration. No apparent delusions, illusions, hallucinations   Impression & Recommendations:  Problem # 1:  B12 DEFICIENCY (ICD-266.2) Assessment Improved  Orders: Venipuncture (52841) TLB-Hepatic/Liver Function Pnl (80076-HEPATIC) TLB-B12 + Folate Pnl (32440_10272-Z36/UYQ) TLB-IBC Pnl (Iron/FE;Transferrin) (83550-IBC) T-Serum Protein Electrophoresis (03474-25956) Prescription Created Electronically [phone]) EKG w/ Interpretation (93000)  Problem # 2:  DEPRESSION (ICD-311) Assessment: Improved  The following medications were removed from the medication list:    Pristiq 100 Mg Xr24h-tab (Desvenlafaxine succinate) .Marland Kitchen... Take one tab once daily    Bupropion Hcl 300 Mg Xr24h-tab (Bupropion hcl) .Marland Kitchen... Take one tab in the morning His updated medication list for this problem includes:    Klonopin 0.5 Mg Tabs (Clonazepam) .Marland Kitchen... Take three tabs by mouth once daily as needed  Problem # 3:  ALTERED MENTAL STATUS (ICD-780.97) Assessment: Improved  Orders: T-2 View CXR (71020TC) Venipuncture (43329) TLB-Hepatic/Liver Function Pnl (80076-HEPATIC) TLB-B12 + Folate Pnl (51884_16606-T01/SWF) TLB-IBC Pnl (Iron/FE;Transferrin) (83550-IBC) T-Serum Protein Electrophoresis  (09323-55732) Prescription Created Electronically ([phone])  Problem # 4:  ERECTILE DYSFUNCTION (ICD-302.72) Assessment: Improved  His updated medication list for this problem includes:    Cialis 20 Mg Tabs (Tadalafil) ..... Uad  Orders: Prescription Created Electronically ([phone])  Complete Medication List: 1)  Aspirin Adult Low Strength 81 Mg Tbec (Aspirin) .... Once daily 2)  Cialis 20 Mg Tabs (Tadalafil) .... Uad 3)  Cyanocobalamin 1000 Mcg/ml Soln (Cyanocobalamin) .... Inject 1 cc once a week for 2 months and then 1cc once a month thereafter 4)  Bd Luer-lok Syringe 25g X 1" 3 Ml Misc (Syringe/needle (disp)) .... Use for b12 injections 5)  Klonopin 0.5 Mg Tabs (Clonazepam) .... Take three tabs by mouth once daily as needed  Patient Instructions: 1)  continued your current medications. 2)  I will call you when I get your lab work back.

## 2010-05-11 NOTE — Progress Notes (Signed)
Summary: bone density results  Phone Note Call from Patient Call back at Home Phone [phone]   Caller: Spouse Call For: Roderick Pee MD Summary of Call: Please call bone density results. Initial call taken by: Lynann Beaver CMA AAMA,  April 14, 2010 10:05 AM  Follow-up for Phone Call        Phone Call Completed Follow-up by: Kern Reap CMA Duncan Dull),  April 14, 2010 1:39 PM

## 2010-05-11 NOTE — Assessment & Plan Note (Signed)
Summary: B12 INJ // RS  Nurse Visit   Allergies: No Known Drug Allergies  Medication Administration  Injection # 1:    Medication: Vit B12 1000 mcg    Diagnosis: B12 DEFICIENCY (ICD-266.2)    Route: IM    Site: L deltoid    Exp Date: 11/08/2010    Lot #: [id number]    Mfr: [employer]    Patient tolerated injection without complications    Given by: Kern Reap CMA (AAMA) (April 15, 2009 3:24 PM)  Orders Added: 1)  Vit B12 1000 mcg [J3420] 2)  Admin of Therapeutic Inj  intramuscular or subcutaneous [96372] Prescriptions: BD LUER-LOK SYRINGE 25G X 1" 3 ML MISC (SYRINGE/NEEDLE (DISP)) use for b12 injections  #100 x 3   Entered by:   Kern Reap CMA (AAMA)   Authorized by:   Roderick Pee MD   Signed by:   Kern Reap CMA (AAMA) on 04/15/2009   Method used:   Electronically to        [facility] [address] (retail)       [address]       [place], [place]  [address]       Ph: [phone] or [phone]       Fax: [phone]   RxID:   [id number] CYANOCOBALAMIN 1000 MCG/ML SOLN (CYANOCOBALAMIN) inject 1 cc once a week for 2 months and then 1cc once a month thereafter  #57ml x 2   Entered by:   Kern Reap CMA (AAMA)   Authorized by:   Roderick Pee MD   Signed by:   Kern Reap CMA (AAMA) on 04/15/2009   Method used:   Electronically to        [facility] [address] (retail)       [address]       [place], [place]  [address]       Ph: [phone] or [phone]       Fax: [phone]   RxID:   [id number]

## 2010-05-11 NOTE — Assessment & Plan Note (Signed)
Summary: MULTIPLE SYMPTOMS // RS   Vital Signs:  Patient profile:   70 year old male Weight:      200 pounds Temp:     98.1 degrees F oral Pulse rate:   88 / minute BP sitting:   140 / 98  (left arm) Cuff size:   regular  Vitals Entered By: Kern Reap CMA Duncan Dull) (April 13, 2009 10:39 AM)  Reason for Visit depression  History of Present Illness: Tyler Mayo  is a 70 year old malecomes in today accompanied by his wife for evaluation of multiple symptoms.  Over the last year, plus  He seen Dr. Rolly Pancake, [place] and   Is on many medications for depression.  However, his wife tells me he's not been out of bed for the past 6 to 8 months.  His multiple somatic complaints, which he says have been only been going on for about 10 days.  However, his wife says they been going on for a long period of time.  He complains of feeling hot and cold shaky nervous voice loss, tremulous, no appetite 10-pound weight loss in the past 10 days with no from nausea, vomiting, diarrhea, et Karie Soda.  His wife states they do not have any weapons in the home and Tyler Mayo  has not talked about suicide  I also called his psychiatrist, Dr. Nolen Mu to discuss the clinical situation.  I'm wondering if what we are seeing is a medication side effect, or he's developing underlying organic dementia.  She has also discussed the situation with the family.  They are going to discontinue his medications.  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  Past Medical History: Reviewed history from 01/05/2008 and no changes required. Depression ETOH free X 15 yrs cervical disk surgery 3 level fusion with cadaver Maurine Minister June 2009 successful.  No  complications erectile dysfunction  Past Surgical History: Reviewed history from 01/17/2007 and no changes required. Tonsillectomy, adenoidectomy ankle fx  Family History: Reviewed history from 01/05/2008 and no changes required. father died at 2, lung cancer smoker,alcoholic mother died 59, heart disease.  No brothers no sisters  Social History: Reviewed history from 01/05/2008 and no changes required. Occupation: Married Former Smoker Alcohol use-no Drug use-no Regular exercise-yes  Review of Systems      See HPI  Physical Exam  General:  Well-developed,well-nourished,in no acute distress; alert,appropriate and cooperative throughout examination Head:  Normocephalic and atraumatic without obvious abnormalities. No apparent alopecia or balding. Eyes:  No corneal or conjunctival inflammation noted. EOMI. Perrla. Funduscopic exam benign, without hemorrhages, exudates or papilledema. Vision grossly normal. Ears:  External ear exam shows no significant lesions or deformities.  Otoscopic examination reveals clear canals, tympanic membranes are intact bilaterally without bulging, retraction, inflammation or discharge. Hearing is grossly normal bilaterally. Nose:  External nasal examination shows no deformity or inflammation. Nasal mucosa are pink and moist without lesions or exudates. Mouth:  Oral mucosa and oropharynx without lesions or exudates.  Teeth in good repair. Neck:  No deformities, masses, or tenderness noted. Chest Wall:  No deformities, masses, tenderness or gynecomastia noted. Lungs:  Normal respiratory effort, chest expands symmetrically. Lungs are clear to auscultation, no crackles or wheezes. Heart:  Normal rate and regular rhythm. S1 and  S2 normal without gallop, murmur, click, rub or other extra sounds. Abdomen:  Bowel sounds positive,abdomen soft and non-tender without masses, organomegaly or hernias noted. Psych:  Oriented X3, depressed affect, flat affect,  subdued, withdrawn, and poor eye contact.     Impression & Recommendations:  Problem # 1:  DEPRESSION (ICD-311) Assessment Deteriorated  The following medications were removed from the medication list:    Klonopin 0.5 Mg Tabs (Clonazepam) .Marland Kitchen... Take 1 tablet by mouth once a day His updated medication list for this problem includes:    Pristiq 100 Mg Xr24h-tab (Desvenlafaxine succinate) .Marland Kitchen... Take one tab once daily    Bupropion Hcl 300 Mg Xr24h-tab (Bupropion hcl) .Marland Kitchen... Take one tab in the morning  Orders: Prescription Created Electronically [id number]) Venipuncture (60454) TLB-BMP (Basic Metabolic Panel-BMET) (80048-METABOL) TLB-CBC Platelet - w/Differential (85025-CBCD) TLB-Hepatic/Liver Function Pnl (80076-HEPATIC) TLB-IBC Pnl (Iron/FE;Transferrin) (83550-IBC) TLB-B12 + Folate Pnl (09811_91478-G95/AOZ) TLB-PSA (Prostate Specific Antigen) (84153-PSA) TLB-T3, Free (Triiodothyronine) (84481-T3FREE) TLB-T4 (Thyrox), Free [phone]) UA Dipstick w/o Micro (automated)  (81003)  Complete Medication List: 1)  Aspirin Adult Low Strength 81 Mg Tbec (Aspirin) .... Once daily 2)  Pristiq 100 Mg Xr24h-tab (Desvenlafaxine succinate) .... Take one tab once daily 3)  Cialis 20 Mg Tabs (Tadalafil) .... Uad 4)  Bupropion Hcl 300 Mg Xr24h-tab (Bupropion hcl) .... Take one tab in the morning 5)  Seroquel 25 Mg Tabs (Quetiapine fumarate) .... Take one tab at bedtime  Patient Instructions: 1)  called Dr. Riley Kill today and make an appointment for him to see her ASAP.  Another option would be to have Dr. Nolen Mu enroll  Tyler Mayo in the outpatient therapy program at behavioral health. 2)  I will call you and I get her lab work.    Laboratory Results   Urine Tests    Routine Urinalysis   Color: yellow Appearance: Clear Glucose: negative   (Normal Range: Negative) Bilirubin: 1+   (Normal Range: Negative) Ketone: trace (5)   (Normal Range: Negative) Spec. Gravity: 1.025   (Normal Range:  1.003-1.035) Blood: negative   (Normal Range: Negative) pH: 5.5   (Normal Range: 5.0-8.0) Protein: 2+   (Normal Range: Negative) Urobilinogen: 1.0   (Normal Range: 0-1) Nitrite: negative   (Normal Range: Negative) Leukocyte Esterace: negative   (Normal Range: Negative)    Comments: Rita Ohara  April 13, 2009 1:21 PM

## 2010-05-12 NOTE — Consult Note (Signed)
Summary: [facility]  [facility]   Imported By: Maryln Gottron 05/10/2009 12:36:23  _____________________________________________________________________  External Attachment:    Type:   Image     Comment:   External Document

## 2010-05-26 ENCOUNTER — Telehealth: Payer: Self-pay | Admitting: Family Medicine

## 2010-05-26 NOTE — Telephone Encounter (Signed)
Triage vm------has been seeing  Dr Murray Hodgkins for pain management, regarding lower vertebrae fracture. He has been on Tramadol for pain. Saw psyche Dr yesterday and was rx'd  Vybrid , which this cannot be used with tramadol. She called Dr Chari Manning office and they told her to call Dr Nelida Meuse office to get a new pain med. For his back. Please advise.

## 2010-05-26 NOTE — Telephone Encounter (Signed)
Call on Monday......... I do not treat chronic pain, and need to go to the pain center

## 2010-06-02 ENCOUNTER — Other Ambulatory Visit: Payer: Self-pay | Admitting: Oncology

## 2010-06-02 ENCOUNTER — Encounter (HOSPITAL_BASED_OUTPATIENT_CLINIC_OR_DEPARTMENT_OTHER): Payer: MEDICARE | Admitting: Oncology

## 2010-06-02 DIAGNOSIS — D472 Monoclonal gammopathy: Secondary | ICD-10-CM

## 2010-06-06 LAB — SPEP & IFE WITH QIG
Alpha-1-Globulin: 3.4 % (ref 2.9–4.9)
Alpha-2-Globulin: 10.5 % (ref 7.1–11.8)
Beta Globulin: 5.6 % (ref 4.7–7.2)
Total Protein, Serum Electrophoresis: 8.2 g/dL (ref 6.0–8.3)

## 2010-06-06 LAB — KAPPA/LAMBDA LIGHT CHAINS
Kappa free light chain: 3.09 mg/dL — ABNORMAL HIGH (ref 0.33–1.94)
Kappa:Lambda Ratio: 0.76 (ref 0.26–1.65)

## 2010-06-15 NOTE — H&P (Signed)
NAME:  Tyler Mayo, Tyler Mayo NO.:  [id number]  MEDICAL RECORD NO.:  [id number]          PATIENT TYPE:  INP  LOCATION:  0101                         FACILITY:  [facility]  PHYSICIAN:  Lonia Blood, M.D.      DATE OF BIRTH:  [birth year]  DATE OF ADMISSION:  05/02/2010 DATE OF DISCHARGE:                             HISTORY & PHYSICAL   PRIMARY CARE PHYSICIAN:  Tinnie Gens A. Tawanna Cooler, MD.  He is unassigned to [place].  PRESENTING COMPLAINT:  Right thigh swelling, pain, and drainage.  HISTORY OF PRESENT ILLNESS:  The patient is a 70 year old male brought in by his wife today with a temperature of 102.3 orally and a draining abscess on his right upper thigh.  The patient seems confused, unable to give adequate history as to when it started, but the wife says she noted it this morning.  He apparently tried to do I and D for himself at home and he has big wound in the area, draining foul-smelling prudent material.  He has also complained of mild pain.  The patient was seen in the ED with slightly low blood pressure and tachycardia, but that has stabilized at this point.  He denied any chills.  The patient is confused as to how long it has been there, he claims 6 months which is less likely.  No prior history of abscess or infection.  PAST MEDICAL HISTORY:  Significant for TIA in 2001.  He has anxiety and recent L1 vertebral fracture secondary to falls.  He has previous surgery with titanium placed in his neck and also recent contact dermatitis.  ALLERGIES:  No known drug allergies.  CURRENT MEDICATIONS: 1. Klonopin 0.5 mg t.i.d. p.r.n. 2. Nuedexta 10 mg twice a day. 3. Ultram 50 mg p.r.n. 4. Benadryl 50 mg p.r.n.  SOCIAL HISTORY:  The patient is married, lives in [place] with his wife.  His wife is here with him today.  No history of tobacco, alcohol, or IV drug use.  He works from home and is normally able to do all his ADLs without a problem.  FAMILY HISTORY:  Significant for  cancer of the lung and thyroid problems in his father.  REVIEW OF SYSTEMS:  All systems reviewed are currently negative except per HPI.  The patient is actually confused.  PHYSICAL EXAMINATION:  VITAL SIGNS:  Temperature 102.3 orally, initial blood pressure 147/80 with a pulse of 131, respiratory rate is 12, saturations 96% on room air.  Currently blood pressure 115/77 with a pulse of 98, respiratory rate of 20, and saturations 100% on room air. GENERAL:  He is awake and alert, but disoriented.  He is in no acute distress.  He is slightly confused and have poor recall. HEENT:  PERRLA.  EOMI.  No pallor.  No jaundice.  No rhinorrhea. NECK:  Supple.  No JVD.  No lymphadenopathy. RESPIRATORY:  He has good air entry bilaterally.  No wheezes.  No rales. No crackles. CARDIOVASCULAR SYSTEM:  He has S1 and S2.  No audible murmur.  No rub. ABDOMEN:  Obese, soft, and nontender with positive bowel sounds. EXTREMITIES:  No edema, cyanosis, or clubbing.  The right upper thigh medial around his groin area is red, swollen with 1 x 1 cm opening, draining purulent material.  No other signs of inflammation is noted on the skin. MUSCULOSKELETAL:  No joint swelling, tenderness, or deformity observed. PSYCHIATRIC:  The patient is confused, otherwise seems pleasant. Responding to commands. NEUROLOGIC:  Nonfocal.  LABORATORY DATA:  White count is 14,100 with left shift, ANC of 11.8, hemoglobin 14.0, and platelet of 219.  Sodium is 135, potassium 3.7, chloride 102, CO2 of 19, glucose 111, BUN 9, creatinine 1.09.  Total bilirubin 1.3, total protein 8.5, albumin 3.7, calcium 9.4, the rest of the LFTs within normal.  Procalcitonin is less than 0.1.  His lactic acid is elevated at 3.1.  CT pelvis with contrast showed no acute findings.  CT lower extremity showed edematous change of the subcutaneous fat in the anterior medial right upper thigh.  There are a few air bubbles in that region, no apparent drainable  fluid collection or abscess, more phlegmon in the area.  Recent MRI of C-spine apparently showed L1 with hairline fracture.  PLAN: 1. Right thigh abscess.  This seems to have been drained due to the     patient's self I and D.  At this point, it is more cellulitis than     anything, but is still draining purulent material.  We will send     the material for culture and sensitivities.  In the meantime, I     will cover him with broad-spectrum antibiotics.  I will start with     vancomycin, and Zosyn.  Once we get Gram stain back or the full     culture and sensitivity, we will narrow down his antibiotic choice.     There is no need for surgical consult at this point since there is     no drainable abscess, it is already drained. 2. Vertebral fracture at L1.  Due to his age, the patient has been     scheduled to have a DEXA scan as an outpatient, but he is here now.     He will need to continue with that after discharge. 3. Anxiety disorder.  We will continue the Klonopin per home dose. 4. Altered mental status, more than likely the confusion is from his     infection and possible sepsis. 5. History of TIA.  This happened in 2001.  He will need to be on     aspirin at least low dose as necessary.  Other than that treatment     will depend on the patient's response to our initial measures.     Lonia Blood, M.D.     Verlin Grills  D:  05/02/2010  T:  05/02/2010  Job:  [id number]  Electronically Signed by Lonia Blood M.D. on 06/14/2010 04:12:31 PM

## 2010-07-10 ENCOUNTER — Ambulatory Visit
Admission: RE | Admit: 2010-07-10 | Discharge: 2010-07-10 | Disposition: A | Discharge status: Home / Self Care | Payer: MEDICARE | Source: Ambulatory Visit | Attending: Neurological Surgery | Admitting: Neurological Surgery

## 2010-07-10 ENCOUNTER — Other Ambulatory Visit: Payer: Self-pay | Admitting: Neurological Surgery

## 2010-07-10 DIAGNOSIS — M545 Low back pain: Secondary | ICD-10-CM

## 2010-07-14 LAB — DIFFERENTIAL
Basophils Relative: 0 % (ref 0–1)
Eosinophils Absolute: 0.3 10*3/uL (ref 0.0–0.7)
Eosinophils Relative: 5 % (ref 0–5)
Monocytes Absolute: 0.7 10*3/uL (ref 0.1–1.0)
Monocytes Relative: 13 % — ABNORMAL HIGH (ref 3–12)
Neutro Abs: 3 10*3/uL (ref 1.7–7.7)

## 2010-07-14 LAB — BASIC METABOLIC PANEL
CO2: 25 mEq/L (ref 19–32)
Chloride: 104 mEq/L (ref 96–112)
GFR calc Af Amer: >60 mL/min (ref >60)
Glucose, Bld: 92 mg/dL (ref 70–99)
Potassium: 4.3 mEq/L (ref 3.5–5.1)
Sodium: 137 mEq/L (ref 135–145)

## 2010-07-14 LAB — CBC
HCT: 45.6 % (ref 39.0–52.0)
Hemoglobin: 15.9 g/dL (ref 13.0–17.0)
MCHC: 34.9 g/dL (ref 30.0–36.0)
MCV: 98.3 fL (ref 78.0–100.0)
RBC: 4.64 MIL/uL (ref 4.22–5.81)
RDW: 13.5 % (ref 11.5–15.5)

## 2010-08-10 ENCOUNTER — Encounter (HOSPITAL_COMMUNITY)
Admission: RE | Admit: 2010-08-10 | Discharge: 2010-08-10 | Disposition: A | Discharge status: Home / Self Care | Payer: Medicare Other | Source: Ambulatory Visit | Attending: Neurological Surgery | Admitting: Neurological Surgery

## 2010-08-10 ENCOUNTER — Other Ambulatory Visit (HOSPITAL_COMMUNITY): Payer: Self-pay | Admitting: Neurological Surgery

## 2010-08-10 DIAGNOSIS — S32009A Unspecified fracture of unspecified lumbar vertebra, initial encounter for closed fracture: Secondary | ICD-10-CM

## 2010-08-10 LAB — CBC
HCT: 45.7 % (ref 39.0–52.0)
MCHC: 34.6 g/dL (ref 30.0–36.0)
Platelets: 263 10*3/uL (ref 150–400)
RDW: 14.1 % (ref 11.5–15.5)

## 2010-08-10 LAB — DIFFERENTIAL
Basophils Absolute: 0 10*3/uL (ref 0.0–0.1)
Eosinophils Absolute: 0.3 10*3/uL (ref 0.0–0.7)
Eosinophils Relative: 3 % (ref 0–5)
Lymphocytes Relative: 35 % (ref 12–46)
Monocytes Absolute: 0.7 10*3/uL (ref 0.1–1.0)

## 2010-08-10 LAB — BASIC METABOLIC PANEL
BUN: 11 mg/dL (ref 6–23)
Calcium: 10.2 mg/dL (ref 8.4–10.5)
GFR calc non Af Amer: >60 mL/min (ref >60)
Glucose, Bld: 96 mg/dL (ref 70–99)
Sodium: 137 mEq/L (ref 135–145)

## 2010-08-11 ENCOUNTER — Other Ambulatory Visit: Payer: Self-pay | Admitting: Neurological Surgery

## 2010-08-11 ENCOUNTER — Ambulatory Visit
Admission: RE | Admit: 2010-08-11 | Discharge: 2010-08-11 | Disposition: A | Discharge status: Home / Self Care | Payer: Medicare Other | Source: Ambulatory Visit | Attending: Neurological Surgery | Admitting: Neurological Surgery

## 2010-08-11 DIAGNOSIS — R911 Solitary pulmonary nodule: Secondary | ICD-10-CM

## 2010-08-14 ENCOUNTER — Other Ambulatory Visit: Payer: Medicare Other

## 2010-08-17 ENCOUNTER — Ambulatory Visit (HOSPITAL_COMMUNITY): Payer: Medicare Other

## 2010-08-17 ENCOUNTER — Observation Stay (HOSPITAL_COMMUNITY)
Admission: RE | Admit: 2010-08-17 | Discharge: 2010-08-17 | Disposition: A | Discharge status: Home / Self Care | Payer: Medicare Other | Source: Ambulatory Visit | Attending: Neurological Surgery | Admitting: Neurological Surgery

## 2010-08-17 ENCOUNTER — Other Ambulatory Visit: Payer: Self-pay | Admitting: Neurological Surgery

## 2010-08-17 DIAGNOSIS — W19XXXA Unspecified fall, initial encounter: Secondary | ICD-10-CM | POA: Insufficient documentation

## 2010-08-17 DIAGNOSIS — S32009A Unspecified fracture of unspecified lumbar vertebra, initial encounter for closed fracture: Principal | ICD-10-CM | POA: Insufficient documentation

## 2010-08-17 DIAGNOSIS — Z87891 Personal history of nicotine dependence: Secondary | ICD-10-CM | POA: Insufficient documentation

## 2010-08-17 DIAGNOSIS — R42 Dizziness and giddiness: Secondary | ICD-10-CM | POA: Insufficient documentation

## 2010-08-17 LAB — PROTIME-INR: Prothrombin Time: 14.4 seconds (ref 11.6–15.2)

## 2010-08-18 HISTORY — PX: OTHER SURGICAL HISTORY: SHX169

## 2010-08-22 NOTE — Op Note (Signed)
NAME:  Tyler Mayo, Tyler Mayo NO.:  [id number]   MEDICAL RECORD NO.:  [id number]          PATIENT TYPE:  OIB   LOCATION:  3534                         FACILITY:  [facility]   PHYSICIAN:  Tia Alert, MD     DATE OF BIRTH:  04-Oct-1940   DATE OF PROCEDURE:  08/20/2007  DATE OF DISCHARGE:  08/20/2007                               OPERATIVE REPORT   PREOPERATIVE DIAGNOSES:  Cervical spondylosis with cervical spinal  stenosis at C5-C6, with cervical disk herniation at C6-C7 on the right,  with neck and right arm pain.   POSTOPERATIVE DIAGNOSES:  Cervical spondylosis with cervical spinal  stenosis at C5-C6, with cervical disk herniation at C6-C7 on the right,  with neck and right arm pain.   PROCEDURES:  1. Decompressive anterior cervical diskectomy, C5-C6 and C6-C7.  2. Anterior cervical arthrodesis, C5-C6, C6-C7 utilizing a 7-mm      corticocancellous allografts.  3. Anterior cervical plating, C5-C7 utilizing a Venture plate.   SURGEON:  Tia Alert, MD   ASSISTANT:  Donalee Citrin, MD   ANESTHESIA:  General endotracheal.   COMPLICATIONS:  None apparent.   INDICATIONS FOR PROCEDURE:  Tyler Mayo is a 70 year old gentleman who is  referred with neck and severe right arm pain.  He had an MRI, which  showed severe spondylosis at C5-C6 and C6-C7 with degenerative disease  at C5-C6, with collapsed disk space.  He had a broad-based osteophytic  ridging disk bulge causing bilateral foraminal stenosis with some canal  stenosis at C5-C6.  She had a foraminal disk herniation at C6-C7 on the  right with compression of the right C7 nerve root.  I recommended a 2-  level anterior cervical diskectomy, fusion, and plating at C5-C6 at C6-  C7.  He understood the risks, benefits, and expected outcome and wished  to proceed.   DESCRIPTION OF PROCEDURE:  The patient was taken to the operating room.  After induction of adequate generalized endotracheal anesthesia, he was  placed in  supine position on the operating room table.  His right  anterior cervical region was prepped with DuraPrep and then draped in  usual sterile fashion.  A 5 mL of local anesthesia was injected and a  transverse incision was made to the right of midline and carried down to  the platysma, which was elevated, opened, and undermined with Metzenbaum  scissors.  I then dissected a plane medial to the sternocleidomastoid  muscle and internal carotid artery and lateral to the trachea and  esophagus to expose C5-C6 and C6-C7.  Intraoperative fluoroscopy  confirmed the level and the longus colli muscles were taken down and the  Shadow-Line retractors were placed under this.  He had large anterior  osteophytes at each level, which were removed with a Leksell rongeur.  I  then used the Kerrison punch to bite off the anterior-inferior overhang  of the vertebral body above the disk space at each level and then used  the curettes to scrape out the endplates and remove this with pituitary  rongeur and then used a high-speed drill to drill the endplates at  C5-C6  and C6-C7.  I drilled in a rectangular fashion to widen the disk spaces  to 7 mm in height.  We drilled down to the level of the posterior  osteophytes and posterior longitudinal ligament.  We then brought in the  operating microscope and utilizing microscopic dissection, opened the  posterior longitudinal ligament at C5-C6 and then removed this,  undercutting the bodies of C5 and C6 utilizing 1 and 2 mm Kerrison punch  to angle and undercut the bodies of C5 and C6.  We marched along the  superior endplate of C6 to identify the pedicles bilaterally.  We  marched along the pedicles to identify the C6 nerve root and then  marched until we were distal to the pedicle level.  There were large  osteophytes, which were removed with a Kerrison punch.  We then palpated  in a circumferential fashion with a black nerve hook to assure adequate  decompression.   The dura was full all the way across and no longer  pushed away from [place].  We felt like the foramina were wide open.  We then  packed this with Gelfoam and went to the C6-C7 level where we performed  the exact same decompression opening the posterior longitudinal ligament  with a nerve hook and then undercutting the bodies of C6 and C7.  We  marched along the superior endplate of C7 to identify the pedicles  bilaterally and then marched along the C7 nerve roots, decompressing  these distally into the foramen.  There was a significant foraminal disk  herniation at C6-C7 on the right.  We marched along the nerve root until  we were distal to the pedicle level, removing disk as we went with a  combination of nerve hook and the Kerrison punch.  Once the  decompression was complete, the dura was once again full.  We palpated  with a nerve hook to assure adequate decompression circumferentially.  We then irrigated with saline solution and removed Gelfoam at the C5-C6  level, measured interspaces to be 7 mm and used two 7-mm  corticocancellous allograft and tapped these into position at C5-C6 and  C6-C7 and then used a sophomore [place], Venture plate and placed two 13-  mm variable angle screws in the bodies of C5, C6, and C7 and these  locked in the plate by locking mechanism within the plate.  We then  spent considerable time drying the surgical bed.  We irrigated with  saline solution.  Once meticulous hemostasis was achieved, we closed the  platysma with 3-0 Vicryl, closed the subcuticular tissue with 3-0  Vicryl, and closed the skin with benzoin and Steri-Strips.  The drapes  were removed.  A sterile dressing was applied.  The patient was awakened  from general anesthesia and transferred tot he recovery room in stable  condition.  At the end of the procedure, all sponge, needle, and  instrument counts were correct.      Tia Alert, MD  Electronically Signed     DSJ/MEDQ  D:   08/20/2007  T:  08/21/2007  Job:  [id number]

## 2010-08-25 NOTE — Consult Note (Signed)
[facility]  Patient:    Tyler Mayo, Tyler Mayo                       MRN: [id number] Attending:  D. Catalina Antigua, M.D.                          Consultation Report  CHIEF COMPLAINT:  Episode with slurring of speech.  HISTORY OF PRESENT ILLNESS:  Patient is a 70 year old male who states that he was going to play golf this morning, arrived at [facility] about 8 a.m. for his match, but was there about an hour early, and states that he just decided to take a nap in the car, but he actually ended up sleeping for three hours or more, and actually missed his golf game.  Several people apparently came by to ask him if he was okay and he indicated to them each time that he was, and states that he does remember doing so, but he states now that he felt very tired and disoriented from about he time that he arrived at [facility].  He does not know that he lost consciousness, thinks that he just took a nap and went to sleep.  Since then, he has been having some mild difficulty with his speech and has just a general lack of energy.  He was able to drive back home without any difficulty.  Other than the  drowsiness and hesitancy of speech, he has had no other symptoms with this. Patient had one prior episode, which his wife thinks was basically the same that occurred at the first of the month and he saw Dr. Sandria Manly for evaluation after that episode.   He had an MRI before that visit, which did not show any abnormalities and Dr. Sandria Manly did not find any abnormalities on his exam at that time, either. Patient states that this morning, he felt normal when he first got up at the house, did not have any other problems prior to arriving there.  REVIEW OF SYSTEMS:  On review of systems, he denies any chest pain or shortness of breath.  Denies any feeling of tachycardia, palpitations.  Denies any double vision or loss of vision.  Denies any difficulty with  swallowing or chewing.  He has not had any difficulty with comprehension, but feels like he has a harder time getting his words to come out.  Review of systems is otherwise negative.  PAST MEDICAL HISTORY: 1. Significant for the one prior spell similar to this. 2. History of alcohol abuse in the past, but none in over 20 years.  CURRENT MEDICATIONS: 1. Luvox 1/2 tablet daily. 2. Clonopin daily. 3. One aspirin daily.  States that is possible that he took a double dose of Clonopin instead of his Luvox this morning.  ALLERGIES:  No known drug allergies.  SOCIAL HISTORY:  He is married.  No tobacco or alcohol use.  He works from home.  FAMILY HISTORY:  Positive for lung cancer and thyroid problems.  PHYSICAL EXAMINATION:  Vital signs:  Blood pressure 100/60, afebrile, pulse 90, respiratory rate 20.  GENERAL:  Pleasant 70 year old male in no apparent distress.  HEENT:  Pupils are equal, round and reactive to light and accommodation, extraocular movements are intact; oropharynx is benign.  NECK:  Supple with full range of motion.  No bruits on auscultation of the carotids.  HEART:  Regular rate and rhythm without murmur.  EXTREMITIES:  No clubbing, cyanosis, or edema.  NEUROLOGIC:  Mental status:  He is alert and oriented x 4.  He is able to name objects and follow commands without difficulty.  He is able to spell WORLD forwards and backwards correctly.  Recall is 3 out of 3 objects immediately, 0 of 3 at 5  minutes on the first attempt, 2 out 3 at 5 minutes at a second attempt. Cranial nerves reveal facial ______ and sensation to be intact.  Tongue protrudes to the midline.  Palate is ______ symmetric.  Funduscopic exam: Disks are sharp. Visual fields are full to confrontational testing.  Motor testing shows full strength n extremities with normal bulk and tone. Sensory exam is intact to touch and pinprick in all four extremities.  Reflexes are 2+ and symmetric.   Toes are downgoing bilaterally.  Coordination intact on finger-to-nose testing.  He had a CT scan done in the emergency room, which was unremarkable.  CBC and chemistries were all within normal limits.  EKG showed normal sinus rhythm.  IMPRESSION:  [age]-year-old male with lethargy and slurring speech x 2, with an otherwise normal exam at this time.  It is unclear what the etiology of these episodes are.  I think partial seizures would be a possibility, TIA is a possibility, or encephalopathy, or medication effect are possibilities.  PLAN:  The patient is to go home.  He does not wish to be admitted for 24-hour observation.  He is to have an EEG done next week and follow up with Dr. Sandria Manly in the office. DD:  07/29/99 TD:  07/29/99 Job: [id number]

## 2010-08-28 ENCOUNTER — Other Ambulatory Visit: Payer: Self-pay | Admitting: Neurological Surgery

## 2010-08-28 ENCOUNTER — Ambulatory Visit
Admission: RE | Admit: 2010-08-28 | Discharge: 2010-08-28 | Disposition: A | Discharge status: Home / Self Care | Payer: Medicare Other | Source: Ambulatory Visit | Attending: Neurological Surgery | Admitting: Neurological Surgery

## 2010-08-28 DIAGNOSIS — M545 Low back pain: Secondary | ICD-10-CM

## 2010-09-05 ENCOUNTER — Other Ambulatory Visit: Payer: Self-pay | Admitting: Thoracic Surgery

## 2010-09-05 ENCOUNTER — Encounter (INDEPENDENT_AMBULATORY_CARE_PROVIDER_SITE_OTHER): Payer: Medicare Other | Admitting: Thoracic Surgery

## 2010-09-05 DIAGNOSIS — R918 Other nonspecific abnormal finding of lung field: Secondary | ICD-10-CM

## 2010-09-05 DIAGNOSIS — D381 Neoplasm of uncertain behavior of trachea, bronchus and lung: Secondary | ICD-10-CM

## 2010-09-06 NOTE — Letter (Signed)
Sep 05, 2010  Tia Alert, MD [address] [place], [place] [address]  Re:  Tyler, Mayo              DOB:  Aug 09, 1940  Dear Tyler Mayo:  I appreciate the opportunity of seeing the patient.  This 70 year old Caucasian male has had multiple problems with his back and has had a recent kyphoplasty.  He has had severe lower back pain and degenerative disk disease and had a L1 vertebroplasty.  At the time of his workup, he was found to have two nodules in his left lung in which he underwent a CT scan that showed a 17-mm right middle lobe ground-glass periphery that were not present in February 2011 x-rays, and then there was a 12- mm anterior ground glass opacity in the left upper lobe.  He has had no hemoptysis, fever, chills, or excessive sputum.  He quit smoking 20 years ago.  MEDICATIONS:  He is in clonazepam, tramadol, aspirin, and antiinflammatory medicine.  ALLERGIES:  He has got no allergies.  FAMILY HISTORY:  Noncontributory.  SOCIAL HISTORY:  He uses occasional cigars.  He is retired.  He is married.  He does not drink alcohol on a regular basis.  PAST SURGICAL HISTORY:  He has had LASIK surgery in 1996 and eyelid surgery in 2005.  REVIEW OF SYSTEMS:  GENERAL:  He is 205 pounds.  He is 5 feet 10 inches. His weight has been stable. CARDIAC:  No angina or atrial fibrillation. PULMONARY:  No hemoptysis, bronchitis, asthma, or wheezing. GI:  No nausea, vomiting, constipation, diarrhea. GU:  No kidney disease but frequent urination. VASCULAR:  No claudication, DVT, TIAs. NEUROLOGIC:  He has had some dizziness.  No headaches or seizures. MUSCULOSKELETAL:  No arthritis. PSYCHIATRIC:  Depression. EYE/ENT:  No changes in eyesight or hearing. HEMATOLOGIC:  No problems with bleeding, clotting disorders, or anemia.  PHYSICAL EXAMINATION:  Vital Signs:  His blood pressure is 116/76, pulse 100, respiration 20, sats were 97%.  Head, Eyes, Ears, Nose, and  Throat: Unremarkable.  Neck:  Supple without thyromegaly.  There is no supraclavicular or axillary adenopathy.  Chest:  Clear to auscultation and percussion.  Heart:  Regular sinus rhythm.  No murmurs.  Abdomen: Soft.  Bowel sounds are normal.  Extremities:  Pulses are 2+.  There is no clubbing or edema.  ASSESSMENT AND PLAN:  I think unfortunately he has two of these ground glass opacities, could be inflammatory or could be adenocarcinoma in situ.  I think the next step is to get a PET scan, and depending on the PET scan we will either recommend short-term followup or possible biopsy.  I explained this in great detail.  I appreciate the opportunity of seeing the patient.  Sincerely,  Tyler Mayo, M.D. Electronically Signed  DPB/MEDQ  D:  09/05/2010  T:  09/06/2010  Job:  [id number]  cc:   Tyler Gens A. Tawanna Cooler, MD Tyler Mayo, M.D.

## 2010-09-11 ENCOUNTER — Encounter (HOSPITAL_COMMUNITY)
Admission: RE | Admit: 2010-09-11 | Discharge: 2010-09-11 | Disposition: A | Discharge status: Home / Self Care | Payer: Medicare Other | Source: Ambulatory Visit | Attending: Thoracic Surgery | Admitting: Thoracic Surgery

## 2010-09-11 DIAGNOSIS — M479 Spondylosis, unspecified: Secondary | ICD-10-CM | POA: Insufficient documentation

## 2010-09-11 DIAGNOSIS — R222 Localized swelling, mass and lump, trunk: Secondary | ICD-10-CM | POA: Insufficient documentation

## 2010-09-11 DIAGNOSIS — R918 Other nonspecific abnormal finding of lung field: Secondary | ICD-10-CM

## 2010-09-11 MED ORDER — FLUDEOXYGLUCOSE F - 18 (FDG) INJECTION
17.7000 | Freq: Once | INTRAVENOUS | Status: AC | PRN
  Administered 2010-09-11: 17.7 via INTRAVENOUS

## 2010-09-12 ENCOUNTER — Ambulatory Visit (INDEPENDENT_AMBULATORY_CARE_PROVIDER_SITE_OTHER): Payer: Medicare Other | Admitting: Thoracic Surgery

## 2010-09-12 ENCOUNTER — Other Ambulatory Visit: Payer: Self-pay | Admitting: Thoracic Surgery

## 2010-09-12 DIAGNOSIS — R918 Other nonspecific abnormal finding of lung field: Secondary | ICD-10-CM

## 2010-09-12 DIAGNOSIS — D381 Neoplasm of uncertain behavior of trachea, bronchus and lung: Secondary | ICD-10-CM

## 2010-09-13 NOTE — Assessment & Plan Note (Signed)
OFFICE VISIT  MARQUIS, DOWN DOB:  Jun 12, 1940                                        September 12, 2010 CHART #:  [id number]  The patient came back today after his PET scan and the standard uptake value on the left upper lobe lesions is 1.5, which was thought to be probably benign, but needed to be watched, however, the one on the right upper lobe lesion was up to 3.2, which makes me worry that he does have very slow-growing neoplasm.  I will go ahead and schedule him for a fiberoptic bronchoscopy with electromagnetic navigation on September 22, 2010, at [facility].  We will get pulmonary function tests on him this week.  [name], M.D. Electronically Signed  DPB/MEDQ  D:  09/12/2010  T:  09/13/2010  Job:  [id number]  cc:   Dr. [name]

## 2010-09-18 ENCOUNTER — Other Ambulatory Visit (HOSPITAL_COMMUNITY): Payer: Medicare Other

## 2010-09-18 ENCOUNTER — Ambulatory Visit (HOSPITAL_COMMUNITY)
Admission: RE | Admit: 2010-09-18 | Discharge: 2010-09-18 | Disposition: A | Discharge status: Home / Self Care | Payer: Medicare Other | Source: Ambulatory Visit | Attending: Thoracic Surgery | Admitting: Thoracic Surgery

## 2010-09-18 DIAGNOSIS — R222 Localized swelling, mass and lump, trunk: Secondary | ICD-10-CM | POA: Insufficient documentation

## 2010-09-18 DIAGNOSIS — J988 Other specified respiratory disorders: Secondary | ICD-10-CM | POA: Insufficient documentation

## 2010-09-19 ENCOUNTER — Ambulatory Visit (HOSPITAL_COMMUNITY)
Admission: RE | Admit: 2010-09-19 | Discharge: 2010-09-19 | Disposition: A | Discharge status: Home / Self Care | Payer: Medicare Other | Source: Ambulatory Visit | Attending: Thoracic Surgery | Admitting: Thoracic Surgery

## 2010-09-19 ENCOUNTER — Encounter (HOSPITAL_COMMUNITY)
Admission: RE | Admit: 2010-09-19 | Discharge: 2010-09-19 | Disposition: A | Discharge status: Home / Self Care | Payer: Medicare Other | Source: Ambulatory Visit | Attending: Thoracic Surgery | Admitting: Thoracic Surgery

## 2010-09-19 ENCOUNTER — Other Ambulatory Visit: Payer: Self-pay | Admitting: Thoracic Surgery

## 2010-09-19 DIAGNOSIS — R911 Solitary pulmonary nodule: Secondary | ICD-10-CM

## 2010-09-19 DIAGNOSIS — Z01812 Encounter for preprocedural laboratory examination: Secondary | ICD-10-CM | POA: Insufficient documentation

## 2010-09-19 DIAGNOSIS — Z01818 Encounter for other preprocedural examination: Secondary | ICD-10-CM | POA: Insufficient documentation

## 2010-09-19 DIAGNOSIS — J984 Other disorders of lung: Secondary | ICD-10-CM | POA: Insufficient documentation

## 2010-09-19 DIAGNOSIS — Z01810 Encounter for preprocedural cardiovascular examination: Secondary | ICD-10-CM | POA: Insufficient documentation

## 2010-09-19 LAB — CBC
HCT: 45.7 % (ref 39.0–52.0)
Hemoglobin: 16.1 g/dL (ref 13.0–17.0)
MCH: 33.5 pg (ref 26.0–34.0)
MCHC: 35.2 g/dL (ref 30.0–36.0)
MCV: 95 fL (ref 78.0–100.0)

## 2010-09-19 LAB — COMPREHENSIVE METABOLIC PANEL
ALT: 23 U/L (ref 0–53)
Alkaline Phosphatase: 98 U/L (ref 39–117)
BUN: 12 mg/dL (ref 6–23)
Chloride: 102 mEq/L (ref 96–112)
GFR calc Af Amer: >60 mL/min (ref >60)
Glucose, Bld: 89 mg/dL (ref 70–99)
Potassium: 4.3 mEq/L (ref 3.5–5.1)
Total Bilirubin: 0.8 mg/dL (ref 0.3–1.2)

## 2010-09-19 LAB — SURGICAL PCR SCREEN: MRSA, PCR: NEGATIVE

## 2010-09-19 NOTE — Op Note (Signed)
NAME:  Tyler Mayo, Tyler Mayo NO.:  [id number]  MEDICAL RECORD NO.:  [id number]           PATIENT TYPE:  O  LOCATION:  3528                         FACILITY:  [facility]  PHYSICIAN:  Tia Alert, MD     DATE OF BIRTH:  06/04/1940  DATE OF PROCEDURE:  08/17/2010 DATE OF DISCHARGE:  08/17/2010                              OPERATIVE REPORT   PREOPERATIVE DIAGNOSES: 1. L1 fracture. 2. History of potential lung cancer.  POSTOPERATIVE DIAGNOSES: 1. L1 fracture. 2. History of potential lung cancer.  PROCEDURES:  L1 vertebroplasty and L1 core biopsy.  SURGEON:  Tia Alert, MD  ASSISTANT:  Reinaldo Meeker, MD  ANESTHESIA:  General endotracheal.  COMPLICATIONS:  None apparent.  INDICATIONS FOR PROCEDURE:  Mr. Pinney is a 70 year old gentleman who has a recent diagnosis of lung masses potentially consistent with lung cancer who has a L1 vertebral body fracture.  This was noted after a fall 4-5 months ago.  It had failed to heal on  serial MRIs.  He had continued back pain related to this.  We recommended a L1 vertebroplasty in hopes of improving his pain syndrome.  We wanted to get a core biopsy while we were dealing this to make sure this was not a pathologic fracture, though we thought that the chance of this was fairly low.  He understood the risks, benefits, and excepted outcome and wished to proceed.  DESCRIPTION OF PROCEDURE:  The patient was taken to the operating room. After induction of adequate generalized endotracheal anesthesia, he was rolled in prone position on the chest rolls and all pressure points were padded.  The lumbar region was prepped with DuraPrep and then draped in the usual sterile fashion.  A biplanar fluoroscopy was used.  We localized our pedicle screw entry zones utilizing AP and lateral fluoroscopy.  We injected the skin, made a small stab incision, and passed our Jamshidi needle to our pedicle screw entry zones utilizing AP and  lateral fluoroscopy.  I then used biplanar fluoroscopy as I passed my Jamshidi needle through the pedicle into the vertebral body.  We made sure that we were at the medial edge of the pedicle on the AP film that we were in the vertebral body on the lateral film.  We then did a core biopsy.  We then mixed our methylmethacrylate and injected this into the vertebral body from the left-hand side to start.  I did interdigitate through the superior part of the fracture and some of it entered the disk space.  We had no breakage into the epidural space or through the inferior part of the vertebral body.  Because it only filled the left- hand side, from the left-sided approach, we decided to do a bilateral vertebroplasty and therefore the opposite side of vertebral body was cannulated through the right pedicle utilizing the same landmarks in the same trajectories.  We injected the methylmethacrylate into the patient's right side also and got good filling of the vertebral body on the right.  Once this was done, we felt like we had good filling of the vertebral body of L1, so the  Jamshidi needle was removed.  The holes were then cleaned and closed with Dermabond.  The drapes were removed. The patient was awakened from anesthesia and transferred to the recovery room in stable condition.  At the end of the procedure, all sponge, needle, and instrument counts were correct.     Tia Alert, MD     DSJ/MEDQ  D:  08/17/2010  T:  08/18/2010  Job:  [id number]  Electronically Signed by [name] MD on 09/19/2010 09:45:55 AM

## 2010-09-21 ENCOUNTER — Other Ambulatory Visit (HOSPITAL_COMMUNITY): Payer: Medicare Other

## 2010-09-21 ENCOUNTER — Ambulatory Visit (HOSPITAL_COMMUNITY)
Admission: RE | Admit: 2010-09-21 | Discharge: 2010-09-21 | Disposition: A | Discharge status: Home / Self Care | Payer: Medicare Other | Source: Ambulatory Visit | Attending: Thoracic Surgery | Admitting: Thoracic Surgery

## 2010-09-21 ENCOUNTER — Encounter (HOSPITAL_COMMUNITY): Payer: Self-pay

## 2010-09-21 DIAGNOSIS — Z981 Arthrodesis status: Secondary | ICD-10-CM | POA: Insufficient documentation

## 2010-09-21 DIAGNOSIS — C3490 Malignant neoplasm of unspecified part of unspecified bronchus or lung: Secondary | ICD-10-CM | POA: Insufficient documentation

## 2010-09-21 DIAGNOSIS — N269 Renal sclerosis, unspecified: Secondary | ICD-10-CM | POA: Insufficient documentation

## 2010-09-21 DIAGNOSIS — R918 Other nonspecific abnormal finding of lung field: Secondary | ICD-10-CM

## 2010-09-21 DIAGNOSIS — I2510 Atherosclerotic heart disease of native coronary artery without angina pectoris: Secondary | ICD-10-CM | POA: Insufficient documentation

## 2010-09-21 DIAGNOSIS — J984 Other disorders of lung: Secondary | ICD-10-CM | POA: Insufficient documentation

## 2010-09-21 DIAGNOSIS — K7689 Other specified diseases of liver: Secondary | ICD-10-CM | POA: Insufficient documentation

## 2010-09-22 ENCOUNTER — Other Ambulatory Visit: Payer: Self-pay | Admitting: Thoracic Surgery

## 2010-09-22 ENCOUNTER — Ambulatory Visit (HOSPITAL_COMMUNITY): Payer: Medicare Other

## 2010-09-22 ENCOUNTER — Ambulatory Visit (HOSPITAL_COMMUNITY)
Admission: RE | Admit: 2010-09-22 | Discharge: 2010-09-22 | Disposition: A | Discharge status: Home / Self Care | Payer: Medicare Other | Source: Ambulatory Visit | Attending: Thoracic Surgery | Admitting: Thoracic Surgery

## 2010-09-22 DIAGNOSIS — Z01818 Encounter for other preprocedural examination: Secondary | ICD-10-CM | POA: Insufficient documentation

## 2010-09-22 DIAGNOSIS — Z01812 Encounter for preprocedural laboratory examination: Secondary | ICD-10-CM | POA: Insufficient documentation

## 2010-09-22 DIAGNOSIS — R911 Solitary pulmonary nodule: Secondary | ICD-10-CM | POA: Insufficient documentation

## 2010-09-22 DIAGNOSIS — Z01810 Encounter for preprocedural cardiovascular examination: Secondary | ICD-10-CM | POA: Insufficient documentation

## 2010-09-22 DIAGNOSIS — D381 Neoplasm of uncertain behavior of trachea, bronchus and lung: Secondary | ICD-10-CM

## 2010-09-23 HISTORY — PX: OTHER SURGICAL HISTORY: SHX169

## 2010-09-23 NOTE — Op Note (Signed)
  NAME:  Tyler Mayo, Tyler Mayo NO.:  [id number]  MEDICAL RECORD NO.:  [id number]  LOCATION:  [facility]SC                         FACILITY:  [facility]  PHYSICIAN:  [name], M.D. DATE OF BIRTH:  August 05, 1940  DATE OF PROCEDURE: DATE OF DISCHARGE:  09/22/2010                              OPERATIVE REPORT   PREOPERATIVE DIAGNOSIS:  Right middle lobe and left upper lobe nodule positive on PET.  POSTOPERATIVE DIAGNOSIS:  Right middle lobe and left upper lobe nodule positive on PET.  OPERATION PERFORMED:  Fiberoptic bronchoscopy with endobronchial ultrasound.  After percutaneous insertion of all monitoring lines, the patient underwent general anesthesia and video bronchoscope was passed through the endotracheal tube.  The carina was in the midline.  The left mainstem, left upper lobe and left lower lobe orifices were normal.  The right mainstem, right upper lobe, right middle lobe orifices were normal.  The video bronchoscope was removed and the working channel with locatable guide was inserted and the video bronchoscope was reinserted. Navigational marking was then done for automatic registration and after that had been done then we started the navigation doing the right middle lobe lesion and we passed the video bronchoscope down to the right middle lobe and using a 90 degree locatable we extended working channel out to within about 0.5 to 1 cm of the lesion of the target.  We then did brushes through the working channel after removing the locatable guide and then we checked the area and then we navigated to be about 1 cm from the lesion and then did biopsies and then finally more brushings from the area.  Initial report on the first slide showed some atypical cells.  We then did a BAL and then replaced the working channel with locatable guide and restarted navigation for the left upper lobe lesion which was in the anterior segment.  It was very difficult to get there and  after about 30 minutes of navigation, we got within 1.7 cm of the lesion and did 2 sets of brushes from those areas passing the needle brush through the scope.  After that had been done, we came back and re- navigated again and after few minutes of navigation, we were able to get closer with a better angle of about 1.1 cm to 1.5 cm from the lesion. We did another set of brushes and then biopsies from the lesion using the needle brush, then the biopsy forceps.  Finally we did a BAL and finished the procedure.  The patient turned to the recovery room in stable condition.     [name], M.D.     DPB/MEDQ  D:  09/22/2010  T:  09/23/2010  Job:  [id number]  Electronically Signed by Jovita Gamma M.D. on 09/23/2010 08:15:01 PM

## 2010-09-24 LAB — CULTURE, RESPIRATORY W GRAM STAIN: Culture: NO GROWTH

## 2010-09-26 ENCOUNTER — Ambulatory Visit (INDEPENDENT_AMBULATORY_CARE_PROVIDER_SITE_OTHER): Payer: Medicare Other | Admitting: Thoracic Surgery

## 2010-09-26 ENCOUNTER — Ambulatory Visit: Payer: Medicare Other | Admitting: Thoracic Surgery

## 2010-09-26 DIAGNOSIS — J8410 Pulmonary fibrosis, unspecified: Secondary | ICD-10-CM

## 2010-09-27 NOTE — Letter (Signed)
September 26, 2010  Tia Alert, MD [address] [place], [place] [address]  Re:  Tyler Mayo, Tyler Mayo              DOB:  1940-05-18  Dear Onalee Hua;  I saw the patient back today after he had undergone electromagnetic navigation bronchoscopy for both his right middle lobe and his left upper lobe lesion.  His standard uptake on the right was 3.2 and it was 1.5 on the left.  We did multiple biopsies and brushings on this area and got areas of chronic inflammations and some multi-nucleated giant cells as well as on both the right and the left side as well as histocytes in both right and left side.  This would go along with both of these being an inflammatory process.  Given the uptake value of being maximal of 3.2, I think it would be safe to follow this and hopefully these will resolve in the next 2-3 months.  I will see him back again at that time and do a repeat CT scan.  I discussed with the patient and his wife and they agree with this plan.  I appreciate the opportunity of seeing the patient.  [name], M.D. Electronically Signed  DPB/MEDQ  D:  09/26/2010  T:  09/27/2010  Job:  [id number]  cc:   Tinnie Gens A. Tawanna Cooler, MD

## 2010-10-19 LAB — FUNGUS CULTURE W SMEAR

## 2010-11-05 LAB — AFB CULTURE WITH SMEAR (NOT AT ARMC): Acid Fast Smear: NONE SEEN

## 2010-11-22 ENCOUNTER — Emergency Department (HOSPITAL_COMMUNITY)
Admission: EM | Admit: 2010-11-22 | Discharge: 2010-11-22 | Disposition: A | Discharge status: Home / Self Care | Payer: Medicare Other | Attending: Emergency Medicine | Admitting: Emergency Medicine

## 2010-11-22 ENCOUNTER — Emergency Department (HOSPITAL_COMMUNITY): Payer: Medicare Other

## 2010-11-22 DIAGNOSIS — Z8673 Personal history of transient ischemic attack (TIA), and cerebral infarction without residual deficits: Secondary | ICD-10-CM | POA: Insufficient documentation

## 2010-11-22 DIAGNOSIS — R0989 Other specified symptoms and signs involving the circulatory and respiratory systems: Secondary | ICD-10-CM | POA: Insufficient documentation

## 2010-11-22 DIAGNOSIS — Z79899 Other long term (current) drug therapy: Secondary | ICD-10-CM | POA: Insufficient documentation

## 2010-11-22 DIAGNOSIS — R0602 Shortness of breath: Secondary | ICD-10-CM | POA: Insufficient documentation

## 2010-11-22 DIAGNOSIS — R0609 Other forms of dyspnea: Secondary | ICD-10-CM | POA: Insufficient documentation

## 2010-11-22 DIAGNOSIS — R072 Precordial pain: Secondary | ICD-10-CM | POA: Insufficient documentation

## 2010-11-22 LAB — CBC
HCT: 41 % (ref 39.0–52.0)
Hemoglobin: 14 g/dL (ref 13.0–17.0)
MCH: 32.6 pg (ref 26.0–34.0)
MCV: 95.3 fL (ref 78.0–100.0)
RBC: 4.3 MIL/uL (ref 4.22–5.81)
WBC: 12.8 10*3/uL — ABNORMAL HIGH (ref 4.0–10.5)

## 2010-11-23 ENCOUNTER — Emergency Department (HOSPITAL_COMMUNITY)
Admission: EM | Admit: 2010-11-23 | Discharge: 2010-11-23 | Disposition: A | Discharge status: Home / Self Care | Payer: Medicare Other | Attending: Emergency Medicine | Admitting: Emergency Medicine

## 2010-11-23 ENCOUNTER — Emergency Department (HOSPITAL_COMMUNITY): Payer: Medicare Other

## 2010-11-23 DIAGNOSIS — Z8673 Personal history of transient ischemic attack (TIA), and cerebral infarction without residual deficits: Secondary | ICD-10-CM | POA: Insufficient documentation

## 2010-11-23 DIAGNOSIS — R0789 Other chest pain: Secondary | ICD-10-CM

## 2010-11-23 DIAGNOSIS — G8929 Other chronic pain: Secondary | ICD-10-CM | POA: Insufficient documentation

## 2010-11-23 DIAGNOSIS — M549 Dorsalgia, unspecified: Secondary | ICD-10-CM | POA: Insufficient documentation

## 2010-11-23 DIAGNOSIS — Z79899 Other long term (current) drug therapy: Secondary | ICD-10-CM | POA: Insufficient documentation

## 2010-11-23 LAB — POCT I-STAT, CHEM 8
BUN: 12 mg/dL (ref 6–23)
Chloride: 104 mEq/L (ref 96–112)
Creatinine, Ser: 1.1 mg/dL (ref 0.50–1.35)
Glucose, Bld: 94 mg/dL (ref 70–99)
Hemoglobin: 15 g/dL (ref 13.0–17.0)
Potassium: 3.9 mEq/L (ref 3.5–5.1)
Sodium: 139 mEq/L (ref 135–145)

## 2010-11-23 LAB — POCT I-STAT TROPONIN I

## 2010-11-23 MED ORDER — IOHEXOL 350 MG/ML SOLN: 80.0000 mL | Freq: Once | INTRAVENOUS | Status: DC | PRN

## 2010-11-24 ENCOUNTER — Encounter: Payer: Self-pay | Admitting: Cardiovascular Disease

## 2010-11-27 ENCOUNTER — Ambulatory Visit (HOSPITAL_COMMUNITY): Payer: Medicare Other | Attending: Family Medicine | Admitting: Radiology

## 2010-11-27 ENCOUNTER — Encounter: Payer: Self-pay | Admitting: Cardiovascular Disease

## 2010-11-27 ENCOUNTER — Ambulatory Visit (INDEPENDENT_AMBULATORY_CARE_PROVIDER_SITE_OTHER): Payer: Medicare Other | Admitting: Cardiovascular Disease

## 2010-11-27 VITALS — Ht 70.0 in | Wt 215.0 lb

## 2010-11-27 DIAGNOSIS — R0602 Shortness of breath: Secondary | ICD-10-CM

## 2010-11-27 DIAGNOSIS — F419 Anxiety disorder, unspecified: Secondary | ICD-10-CM | POA: Insufficient documentation

## 2010-11-27 DIAGNOSIS — R9430 Abnormal result of cardiovascular function study, unspecified: Secondary | ICD-10-CM

## 2010-11-27 DIAGNOSIS — I2510 Atherosclerotic heart disease of native coronary artery without angina pectoris: Secondary | ICD-10-CM | POA: Insufficient documentation

## 2010-11-27 DIAGNOSIS — F411 Generalized anxiety disorder: Secondary | ICD-10-CM

## 2010-11-27 DIAGNOSIS — R079 Chest pain, unspecified: Secondary | ICD-10-CM | POA: Insufficient documentation

## 2010-11-27 MED ORDER — REGADENOSON 0.4 MG/5ML IV SOLN
0.4000 mg | Freq: Once | INTRAVENOUS | Status: AC
  Administered 2010-11-27: 0.4 mg via INTRAVENOUS

## 2010-11-27 MED ORDER — TECHNETIUM TC 99M TETROFOSMIN IV KIT
33.0000 | PACK | Freq: Once | INTRAVENOUS | Status: AC | PRN
  Administered 2010-11-27: 33 via INTRAVENOUS

## 2010-11-27 MED ORDER — TECHNETIUM TC 99M TETROFOSMIN IV KIT
11.0000 | PACK | Freq: Once | INTRAVENOUS | Status: AC | PRN
  Administered 2010-11-27: 11 via INTRAVENOUS

## 2010-11-27 NOTE — Progress Notes (Signed)
[facility] SITE 3 NUCLEAR MED [address] [place] [address] [phone]  Cardiology Nuclear Med Study  VERNEL LANGENDERFER is a 70 y.o. male [id number] 1940/08/31   Nuclear Med Background Indication for Stress Test:  Evaluation for Ischemia and 11/22/10 Post Hospital with CP, MI ruled out History:  11/21/10 Cardiac CT:(R) coronary and proximal LAD calcification, Ca score 83.6 Cardiac Risk Factors: History of Smoking, Overweight and TIA  Symptoms:  Chest Pain with and without Exertion (last episode of chest discomfort:none since discharge) and SOB   Nuclear Pre-Procedure Caffeine/Decaff Intake:  None NPO After: 10:30pm   Lungs:  Clear.  O2 sat 94% on RA. IV 0.9% NS with Angio Cath:  20g  IV Site: R Hand  IV Started by:  Cathlyn Parsons, RN  Chest Size (in):  41 Cup Size: n/a  Height: 5\' 10"  (1.778 m)  Weight:  215 lb (97.523 kg)  BMI:  Body mass index is 30.85 kg/(m^2). Tech Comments:  NA    Nuclear Med Study 1 or 2 day study: 1 day  Stress Test Type:  Treadmill/Lexiscan  Reading MD: Kristeen Miss, MD  Order Authorizing Provider:  Charlton Haws, MD  Resting Radionuclide: Technetium 44m Tetrofosmin  Resting Radionuclide Dose: 11.0 mCi   Stress Radionuclide:  Technetium 30m Tetrofosmin  Stress Radionuclide Dose: 33.0 mCi           Stress Protocol Rest HR: 79 Stress HR: 120  Rest BP: 109/76 Stress BP: 122/63  Exercise Time (min): 2:00 METS: n/a   Predicted Max HR: 153 bpm % Max HR: 78.43 bpm Rate Pressure Product: 21308   Dose of Adenosine (mg):  n/a Dose of Lexiscan: 0.4 mg  Dose of Atropine (mg): n/a Dose of Dobutamine: n/a mcg/kg/min (at max HR)  Stress Test Technologist: Smiley Houseman, MD  Nuclear Technologist:  Doyne Keel, CNMT     Rest Procedure:  Myocardial perfusion imaging was performed at rest 45 minutes following the intravenous administration of Technetium 85m Tetrofosmin.  Rest ECG: No acute changes.  Stress Procedure:  The  patient received IV Lexiscan 0.4 mg over 15-seconds with concurrent low level exercise and then Technetium 11m Tetrofosmin was injected at 30-seconds while the patient continued walking one more minute.  There were no significant changes with Lexiscan.  Quantitative spect images were obtained after a 45-minute delay.  Stress ECG: No significant change from baseline ECG  QPS Raw Data Images:  Normal; no motion artifact; normal heart/lung ratio. Stress Images:  Normal homogeneous uptake in all areas of the myocardium. Rest Images:  Normal homogeneous uptake in all areas of the myocardium. Subtraction (SDS):  Normal Transient Ischemic Dilatation (Normal <1.22):  1.09 Lung/Heart Ratio (Normal <0.45):  0.27  Quantitative Gated Spect Images QGS EDV:  64 ml QGS ESV:  17 ml QGS cine images:  NL LV Function; NL Wall Motion QGS EF: 73%  Impression Exercise Capacity:  Lexiscan with low level exercise. BP Response:  Normal blood pressure response. Clinical Symptoms:  No chest pain. ECG Impression:  No significant ST segment change suggestive of ischemia. Comparison with Prior Nuclear Study: No previous nuclear study performed  Overall Impression:  Normal stress nuclear study.  No evidence of ischemia.  Normal LV function.    Alvia Grove., MD, [facility]    ss

## 2010-11-27 NOTE — Progress Notes (Signed)
[name] is a 70 year old gentleman with a history of TIA, chronic back pain, and inflammatory lung nodules with  no prior cardiac history who was seen in ER 8/16  with chest pain.  He has been  doing physical therapy over the last week without any exertional symptoms, whatsoever.  On Wednesday, while lying in bed around 1 o'clock   in the afternoon, he developed chest discomfort, which he described as a  chest cold/pressure sensation, slightly reminiscent of indigestion.  He  called his wife who became concerned who called the EMS.  He had no  associated symptoms and no radiation of pain.  He was given sublingual  nitroglycerin and several minutes later his chest discomfort moved from  the left side to the center of his chest.  He had constant chest pain  from approximately 1 p.m. until 12 hours later and has had none since.  It resolved spontaneously after going to bed.  Was to have cardiac CT but BP too low to get nitro and only had calcium score which was 83 primarily in LAD CT reviewed as well as ER notes  F/U myovue in our office today reviewed.  Lexiscan EF 74% normal study.  ROS: Denies fever, malais, weight loss, blurry vision, decreased visual acuity, cough, sputum, SOB, hemoptysis, pleuritic pain, palpitaitons, heartburn, abdominal pain, melena, lower extremity edema, claudication, or rash.  All other systems reviewed and negative   General: Affect appropriate Healthy:  appears stated age HEENT: normal Neck supple with no adenopathy JVP normal no bruits no thyromegaly Lungs clear with no wheezing and good diaphragmatic motion Heart:  S1/S2 no murmur,rub, gallop or click PMI normal Abdomen: benighn, BS positve, no tenderness, no AAA no bruit.  No HSM or HJR Distal pulses intact with no bruits No edema Neuro non-focal Skin warm and dry No muscular weakness  Medications Current Outpatient Prescriptions  Medication Sig Dispense Refill  . aspirin 81 MG tablet Take 81 mg by  mouth daily.        . clonazePAM (KLONOPIN) 0.5 MG tablet Take 0.5 mg by mouth 2 (two) times daily as needed.        Marland Kitchen HYDROcodone-acetaminophen (NORCO) 5-325 MG per tablet as needed.      Marland Kitchen LYRICA 25 MG capsule Take 2 tablets by mouth Daily.      Marland Kitchen NITROSTAT 0.3 MG SL tablet       . NUEDEXTA 20-10 MG CAPS Take 2 tablets by mouth Daily.      . tadalafil (CIALIS) 20 MG tablet Take 20 mg by mouth daily as needed.        Marland Kitchen VIIBRYD 10 MG TABS Take 1 tablet by mouth Daily.       No current facility-administered medications for this visit.   Facility-Administered Medications Ordered in Other Visits  Medication Dose Route Frequency Provider Last Rate Last Dose  . regadenoson (LEXISCAN) injection SOLN 0.4 mg  0.4 mg Intravenous Once Wendall Stade, MD   0.4 mg at 11/27/10 0935  . technetium tetrofosmin (TC-MYOVIEW) injection 11 milli Curie  11 milli Curie Intravenous Once PRN Elyn Aquas., MD   11 milli Curie at 11/27/10 [phone]  . technetium tetrofosmin (TC-MYOVIEW) injection 33 milli Curie  33 milli Curie Intravenous Once PRN Elyn Aquas., MD   33 milli Curie at 11/27/10 1191    Allergies Review of patient's allergies indicates no known allergies.  Family History: No family history on file.  Social History: History   Social History  .  Marital Status: Married    Spouse Name: N/A    Number of Children: N/A  . Years of Education: N/A   Occupational History  . Not on file.   Social History Main Topics  . Smoking status: Former Games developer  . Smokeless tobacco: Not on file  . Alcohol Use: No     ETOH free x 15+ yrs  . Drug Use: No  . Sexually Active: Not on file   Other Topics Concern  . Not on file   Social History Narrative   Regular exercise    Electrocardiogram:  From myovue  NSR 96 normal ECG  No change from 09/19/10 rate 80 then  Assessment and Plan

## 2010-11-27 NOTE — Assessment & Plan Note (Signed)
Non flow limiting.  Calcium score 83 Continue ASA. F/U with Dr Tawanna Cooler for cholesterol.  Last TC in chart 2009 202.  Consider low dose statin for LDL greater than 100

## 2010-11-27 NOTE — Assessment & Plan Note (Signed)
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 Normal exam and ECG.  Observe  Pain atypical

## 2010-11-27 NOTE — Assessment & Plan Note (Signed)
F/U Dr Tawanna Cooler  Chronic.  Consider Cymbalta

## 2010-11-28 ENCOUNTER — Other Ambulatory Visit: Payer: Self-pay | Admitting: Thoracic Surgery

## 2010-11-28 DIAGNOSIS — R911 Solitary pulmonary nodule: Secondary | ICD-10-CM
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
## 2010-12-26 ENCOUNTER — Encounter: Payer: Self-pay | Admitting: *Deleted

## 2010-12-26 DIAGNOSIS — D381 Neoplasm of uncertain behavior of trachea, bronchus and lung: Secondary | ICD-10-CM

## 2010-12-27 ENCOUNTER — Encounter: Payer: Self-pay | Admitting: Thoracic Surgery

## 2010-12-27 ENCOUNTER — Other Ambulatory Visit: Payer: Self-pay | Admitting: Thoracic Surgery

## 2010-12-27 ENCOUNTER — Ambulatory Visit
Admission: RE | Admit: 2010-12-27 | Discharge: 2010-12-27 | Disposition: A | Discharge status: Home / Self Care | Payer: Medicare Other | Source: Ambulatory Visit | Attending: Thoracic Surgery | Admitting: Thoracic Surgery

## 2010-12-27 ENCOUNTER — Ambulatory Visit (INDEPENDENT_AMBULATORY_CARE_PROVIDER_SITE_OTHER): Payer: Medicare Other | Admitting: Thoracic Surgery

## 2010-12-27 VITALS — BP 104/71 | HR 100 | Resp 16 | Ht 70.0 in | Wt 210.0 lb

## 2010-12-27 DIAGNOSIS — D381 Neoplasm of uncertain behavior of trachea, bronchus and lung: Secondary | ICD-10-CM

## 2010-12-27 DIAGNOSIS — R911 Solitary pulmonary nodule: Secondary | ICD-10-CM

## 2010-12-27 NOTE — Progress Notes (Signed)
HPI the patient returns for a followup CT scan. The lesion in the right middle lobe and the left upper lobe are unchanged. I still am concerned about that particularly about the right middle lobe possibly being cancer. Her last biopsy showed histiocytes and multinucleated giant cells. I will get a repeat biopsy of the right middle lobe. If it is negative I will continue to follow him with serial CT scans.   Current Outpatient Prescriptions  Medication Sig Dispense Refill  . aspirin 81 MG tablet Take 81 mg by mouth daily.        . clonazePAM (KLONOPIN) 0.5 MG tablet Take 0.5 mg by mouth 2 (two) times daily as needed.        Marland Kitchen HYDROcodone-acetaminophen (NORCO) 5-325 MG per tablet as needed.      Marland Kitchen LYRICA 25 MG capsule Take 2 tablets by mouth Daily.      Marland Kitchen NITROSTAT 0.3 MG SL tablet       . NUEDEXTA 20-10 MG CAPS Take 2 tablets by mouth Daily.      . tadalafil (CIALIS) 20 MG tablet Take 20 mg by mouth daily as needed.        Marland Kitchen VIIBRYD 10 MG TABS Take 1 tablet by mouth Daily.         Review of Systems: He had a history of chest pain that was negative on workup. Physical Exam  Cardiovascular: Normal rate, regular rhythm and normal heart sounds.   Pulmonary/Chest: Effort normal and breath sounds normal. No respiratory distress.     Diagnostic Tests CT scan showed right middle lobe and left upper lobe nodules were unchanged.:   Impression: Right middle lobe and left upper lobe nodules probably inflammatory  Plan: Repeat biopsy of right middle lobe nodule.

## 2011-01-03 LAB — CBC
HCT: 49.9
Hemoglobin: 17
RBC: 5.15
RDW: 13.3

## 2011-01-03 LAB — BASIC METABOLIC PANEL
CO2: 27
Chloride: 103
Creatinine, Ser: 1.05
GFR calc Af Amer: >60
Sodium: 137

## 2011-01-03 LAB — DIFFERENTIAL
Basophils Absolute: 0.1
Eosinophils Relative: 9 — ABNORMAL HIGH
Lymphocytes Relative: 41
Lymphs Abs: 3.3
Monocytes Absolute: 0.8
Monocytes Relative: 9

## 2011-01-04 ENCOUNTER — Encounter: Payer: Self-pay | Admitting: Thoracic Surgery

## 2011-01-05 ENCOUNTER — Other Ambulatory Visit: Payer: Self-pay | Admitting: Thoracic Surgery

## 2011-01-05 ENCOUNTER — Telehealth: Payer: Self-pay | Admitting: *Deleted

## 2011-01-05 ENCOUNTER — Ambulatory Visit (HOSPITAL_COMMUNITY)
Admission: RE | Admit: 2011-01-05 | Discharge: 2011-01-05 | Disposition: A | Discharge status: Home / Self Care | Payer: Medicare Other | Source: Ambulatory Visit | Attending: Thoracic Surgery | Admitting: Thoracic Surgery

## 2011-01-05 ENCOUNTER — Other Ambulatory Visit: Payer: Self-pay | Admitting: Interventional Radiology

## 2011-01-05 DIAGNOSIS — R911 Solitary pulmonary nodule: Secondary | ICD-10-CM

## 2011-01-05 DIAGNOSIS — D381 Neoplasm of uncertain behavior of trachea, bronchus and lung: Secondary | ICD-10-CM

## 2011-01-05 LAB — CBC
HCT: 43.7 % (ref 39.0–52.0)
Hemoglobin: 15.1 g/dL (ref 13.0–17.0)
MCV: 94.8 fL (ref 78.0–100.0)
RBC: 4.61 MIL/uL (ref 4.22–5.81)
WBC: 7.4 10*3/uL (ref 4.0–10.5)

## 2011-01-05 LAB — PROTIME-INR: INR: 1.03 (ref 0.00–1.49)

## 2011-01-05 LAB — PSA: PSA: 0.46 ng/mL (ref <=4.00)

## 2011-01-05 NOTE — Telephone Encounter (Signed)
Pt is at the hospital to get labs for a lung biopsy and is insisting he has a PSA.  The Pulmonology office will not ok this, and advised xray to call Dr. Tawanna Cooler.  Advised this is ok, but to let the pt know there may be insurance issues??

## 2011-01-09 ENCOUNTER — Encounter: Payer: Self-pay | Admitting: Thoracic Surgery

## 2011-01-09 ENCOUNTER — Ambulatory Visit (INDEPENDENT_AMBULATORY_CARE_PROVIDER_SITE_OTHER): Payer: Medicare Other | Admitting: Thoracic Surgery

## 2011-01-09 VITALS — BP 101/77 | HR 100 | Resp 18 | Ht 70.0 in | Wt 214.0 lb

## 2011-01-09 DIAGNOSIS — R918 Other nonspecific abnormal finding of lung field: Secondary | ICD-10-CM

## 2011-01-09 NOTE — Progress Notes (Signed)
HPI patient returns today after a needle biopsy of his right lung lesion. Preliminary report by the pathologist is that this is benign. I will call the patient with the final report. I plan to continue to follow these lesions. I will see him again in 6 months with another CT scan.   Current Outpatient Prescriptions  Medication Sig Dispense Refill  . aspirin 81 MG tablet Take 81 mg by mouth daily.        . clonazePAM (KLONOPIN) 0.5 MG tablet Take 0.5 mg by mouth 2 (two) times daily as needed.        Marland Kitchen HYDROcodone-acetaminophen (NORCO) 5-325 MG per tablet as needed.      Marland Kitchen LYRICA 25 MG capsule Take 2 tablets by mouth Daily.      Marland Kitchen NITROSTAT 0.3 MG SL tablet       . NUEDEXTA 20-10 MG CAPS Take 2 tablets by mouth Daily.      . tadalafil (CIALIS) 20 MG tablet Take 20 mg by mouth daily as needed.        Marland Kitchen VIIBRYD 10 MG TABS Take 1 tablet by mouth Daily.         Review of Systems: No change   Physical Exam  Cardiovascular: Normal rate and regular rhythm.   Pulmonary/Chest: Effort normal and breath sounds normal. No respiratory distress.     Diagnostic Tests: Needle biopsy of right lung lesion shows benign changes.   Impression: Bilateral pulmonary nodules benign on biopsy   Plan: Followup in 6 months with a CT scan

## 2011-05-12 ENCOUNTER — Inpatient Hospital Stay (HOSPITAL_COMMUNITY): Payer: Medicare Other

## 2011-05-12 ENCOUNTER — Inpatient Hospital Stay (HOSPITAL_COMMUNITY)
Admission: EM | Admit: 2011-05-12 | Discharge: 2011-05-14 | DRG: 247 | Disposition: A | Discharge status: Home / Self Care | Payer: Medicare Other | Attending: Cardiovascular Disease | Admitting: Cardiovascular Disease

## 2011-05-12 ENCOUNTER — Encounter (HOSPITAL_COMMUNITY)
Admission: EM | Disposition: A | Discharge status: Home / Self Care | Payer: Self-pay | Source: Home / Self Care | Attending: Cardiovascular Disease

## 2011-05-12 ENCOUNTER — Other Ambulatory Visit: Payer: Self-pay

## 2011-05-12 ENCOUNTER — Encounter (HOSPITAL_COMMUNITY): Payer: Self-pay | Admitting: *Deleted

## 2011-05-12 DIAGNOSIS — C3490 Malignant neoplasm of unspecified part of unspecified bronchus or lung: Secondary | ICD-10-CM | POA: Diagnosis present

## 2011-05-12 DIAGNOSIS — I2119 ST elevation (STEMI) myocardial infarction involving other coronary artery of inferior wall: Principal | ICD-10-CM

## 2011-05-12 DIAGNOSIS — Z8673 Personal history of transient ischemic attack (TIA), and cerebral infarction without residual deficits: Secondary | ICD-10-CM

## 2011-05-12 DIAGNOSIS — F1011 Alcohol abuse, in remission: Secondary | ICD-10-CM | POA: Diagnosis present

## 2011-05-12 DIAGNOSIS — I2510 Atherosclerotic heart disease of native coronary artery without angina pectoris: Secondary | ICD-10-CM | POA: Insufficient documentation

## 2011-05-12 DIAGNOSIS — M545 Low back pain, unspecified: Secondary | ICD-10-CM | POA: Diagnosis present

## 2011-05-12 DIAGNOSIS — I959 Hypotension, unspecified: Secondary | ICD-10-CM | POA: Diagnosis present

## 2011-05-12 DIAGNOSIS — F329 Major depressive disorder, single episode, unspecified: Secondary | ICD-10-CM | POA: Diagnosis present

## 2011-05-12 DIAGNOSIS — I214 Non-ST elevation (NSTEMI) myocardial infarction: Secondary | ICD-10-CM

## 2011-05-12 DIAGNOSIS — Z7982 Long term (current) use of aspirin: Secondary | ICD-10-CM

## 2011-05-12 DIAGNOSIS — Z87891 Personal history of nicotine dependence: Secondary | ICD-10-CM

## 2011-05-12 DIAGNOSIS — I213 ST elevation (STEMI) myocardial infarction of unspecified site: Secondary | ICD-10-CM

## 2011-05-12 DIAGNOSIS — Z981 Arthrodesis status: Secondary | ICD-10-CM

## 2011-05-12 DIAGNOSIS — E785 Hyperlipidemia, unspecified: Secondary | ICD-10-CM

## 2011-05-12 DIAGNOSIS — Z79899 Other long term (current) drug therapy: Secondary | ICD-10-CM

## 2011-05-12 DIAGNOSIS — G8929 Other chronic pain: Secondary | ICD-10-CM | POA: Diagnosis present

## 2011-05-12 DIAGNOSIS — I2582 Chronic total occlusion of coronary artery: Secondary | ICD-10-CM | POA: Diagnosis present

## 2011-05-12 DIAGNOSIS — F3289 Other specified depressive episodes: Secondary | ICD-10-CM | POA: Insufficient documentation

## 2011-05-12 HISTORY — DX: Hyperlipidemia, unspecified: E78.5

## 2011-05-12 HISTORY — DX: Atherosclerotic heart disease of native coronary artery without angina pectoris: I25.10

## 2011-05-12 HISTORY — PX: PERCUTANEOUS CORONARY STENT INTERVENTION (PCI-S): SHX5485

## 2011-05-12 HISTORY — DX: Personal history of nicotine dependence: Z87.891

## 2011-05-12 HISTORY — PX: LEFT HEART CATHETERIZATION WITH CORONARY ANGIOGRAM: SHX5451

## 2011-05-12 LAB — DIFFERENTIAL
Basophils Absolute: 0.1 10*3/uL (ref 0.0–0.1)
Lymphocytes Relative: 53 % — ABNORMAL HIGH (ref 12–46)
Monocytes Absolute: 0.8 10*3/uL (ref 0.1–1.0)
Monocytes Relative: 8 % (ref 3–12)
Neutro Abs: 3.7 10*3/uL (ref 1.7–7.7)
Neutrophils Relative %: 34 % — ABNORMAL LOW (ref 43–77)

## 2011-05-12 LAB — BASIC METABOLIC PANEL
CO2: 24 mEq/L (ref 19–32)
Calcium: 9.2 mg/dL (ref 8.4–10.5)
Chloride: 105 mEq/L (ref 96–112)
Creatinine, Ser: 0.98 mg/dL (ref 0.50–1.35)
GFR calc Af Amer: >90 mL/min (ref >90)
Sodium: 141 mEq/L (ref 135–145)

## 2011-05-12 LAB — CBC
HCT: 42.8 % (ref 39.0–52.0)
Hemoglobin: 14.7 g/dL (ref 13.0–17.0)
RBC: 4.54 MIL/uL (ref 4.22–5.81)
WBC: 10.7 10*3/uL — ABNORMAL HIGH (ref 4.0–10.5)

## 2011-05-12 LAB — CARDIAC PANEL(CRET KIN+CKTOT+MB+TROPI)
CK, MB: 1.3 ng/mL (ref 0.3–4.0)
CK, MB: 7.4 ng/mL (ref 0.3–4.0)
CK, MB: 46 ng/mL (ref 0.3–4.0)
Total CK: 102 U/L (ref 7–232)
Troponin I: 1.11 ng/mL (ref <0.30)

## 2011-05-12 LAB — PROTIME-INR: INR: 1.05 (ref 0.00–1.49)

## 2011-05-12 LAB — APTT: aPTT: 32 seconds (ref 24–37)

## 2011-05-12 SURGERY — PERCUTANEOUS CORONARY STENT INTERVENTION (PCI-S)
Laterality: Right

## 2011-05-12 MED ORDER — ROSUVASTATIN CALCIUM 40 MG PO TABS
40.0000 mg | ORAL_TABLET | Freq: Every day | ORAL | Status: DC
  Administered 2011-05-12 – 2011-05-13 (×2): 40 mg via ORAL
  Filled 2011-05-12 (×3): qty 1

## 2011-05-12 MED ORDER — HYDROCODONE-ACETAMINOPHEN 5-325 MG PO TABS
1.0000 | ORAL_TABLET | Freq: Four times a day (QID) | ORAL | Status: DC | PRN
  Administered 2011-05-12 – 2011-05-13 (×2): 1 via ORAL
  Administered 2011-05-13: 2 via ORAL
  Administered 2011-05-13 (×2): 1 via ORAL
  Administered 2011-05-14: 2 via ORAL
  Filled 2011-05-12: qty 1
  Filled 2011-05-12: qty 2
  Filled 2011-05-12 (×3): qty 1
  Filled 2011-05-12: qty 2

## 2011-05-12 MED ORDER — ASPIRIN 81 MG PO CHEW
324.0000 mg | CHEWABLE_TABLET | ORAL | Status: DC
  Filled 2011-05-12: qty 4

## 2011-05-12 MED ORDER — BIVALIRUDIN 250 MG IV SOLR
INTRAVENOUS | Status: AC
  Filled 2011-05-12: qty 250

## 2011-05-12 MED ORDER — CLONAZEPAM 0.5 MG PO TABS: 0.5000 mg | ORAL_TABLET | Freq: Two times a day (BID) | ORAL | Status: DC | PRN

## 2011-05-12 MED ORDER — PRASUGREL HCL 10 MG PO TABS
ORAL_TABLET | ORAL | Status: AC
  Filled 2011-05-12: qty 6

## 2011-05-12 MED ORDER — NITROGLYCERIN 0.4 MG SL SUBL: 0.4000 mg | SUBLINGUAL_TABLET | SUBLINGUAL | Status: DC | PRN

## 2011-05-12 MED ORDER — ASPIRIN 81 MG PO TABS: 81.0000 mg | ORAL_TABLET | Freq: Every day | ORAL | Status: DC

## 2011-05-12 MED ORDER — ACETAMINOPHEN 325 MG PO TABS: 650.0000 mg | ORAL_TABLET | ORAL | Status: DC | PRN

## 2011-05-12 MED ORDER — HEPARIN BOLUS VIA INFUSION: 60.0000 [IU]/kg | Freq: Once | INTRAVENOUS | Status: DC

## 2011-05-12 MED ORDER — DEXTROMETHORPHAN-QUINIDINE 20-10 MG PO CAPS: 2.0000 | ORAL_CAPSULE | Freq: Every day | ORAL | Status: DC

## 2011-05-12 MED ORDER — FENTANYL CITRATE 0.05 MG/ML IJ SOLN
INTRAMUSCULAR | Status: AC
  Filled 2011-05-12: qty 2

## 2011-05-12 MED ORDER — VILAZODONE HCL 10 MG PO TABS: 10.0000 mg | ORAL_TABLET | Freq: Every day | ORAL | Status: DC

## 2011-05-12 MED ORDER — HEPARIN (PORCINE) IN NACL 2-0.9 UNIT/ML-% IJ SOLN
INTRAMUSCULAR | Status: AC
  Filled 2011-05-12: qty 2000

## 2011-05-12 MED ORDER — ASPIRIN 300 MG RE SUPP
300.0000 mg | RECTAL | Status: DC
  Filled 2011-05-12: qty 1

## 2011-05-12 MED ORDER — HEPARIN SODIUM (PORCINE) 5000 UNIT/ML IJ SOLN
4000.0000 [IU] | Freq: Once | INTRAMUSCULAR | Status: AC
  Administered 2011-05-12: 4000 [IU] via INTRAVENOUS

## 2011-05-12 MED ORDER — NITROGLYCERIN 0.2 MG/ML ON CALL CATH LAB
INTRAVENOUS | Status: AC
  Filled 2011-05-12: qty 1

## 2011-05-12 MED ORDER — HEPARIN SODIUM (PORCINE) 5000 UNIT/ML IJ SOLN
INTRAMUSCULAR | Status: AC
  Administered 2011-05-12: 4000 [IU] via INTRAVENOUS
  Filled 2011-05-12: qty 1

## 2011-05-12 MED ORDER — SODIUM CHLORIDE 0.9 % IV SOLN
INTRAVENOUS | Status: DC
  Administered 2011-05-12: 09:00:00 via INTRAVENOUS

## 2011-05-12 MED ORDER — MIDAZOLAM HCL 2 MG/2ML IJ SOLN
INTRAMUSCULAR | Status: AC
  Filled 2011-05-12: qty 2

## 2011-05-12 MED ORDER — ALPRAZOLAM 0.25 MG PO TABS: 0.2500 mg | ORAL_TABLET | Freq: Two times a day (BID) | ORAL | Status: DC | PRN

## 2011-05-12 MED ORDER — ONDANSETRON HCL 4 MG/2ML IJ SOLN: 4.0000 mg | Freq: Four times a day (QID) | INTRAMUSCULAR | Status: DC | PRN

## 2011-05-12 MED ORDER — ONDANSETRON HCL 4 MG/2ML IJ SOLN
INTRAMUSCULAR | Status: AC
  Administered 2011-05-12: 4 mg
  Filled 2011-05-12: qty 2

## 2011-05-12 MED ORDER — SODIUM CHLORIDE 0.9 % IV BOLUS (SEPSIS)
1000.0000 mL | Freq: Once | INTRAVENOUS | Status: AC
  Administered 2011-05-12: 1000 mL via INTRAVENOUS

## 2011-05-12 MED ORDER — VILAZODONE HCL 20 MG PO TABS
10.0000 mg | ORAL_TABLET | Freq: Every day | ORAL | Status: DC
  Administered 2011-05-14: 10 mg via ORAL
  Filled 2011-05-12 (×4): qty 0.5

## 2011-05-12 MED ORDER — LIDOCAINE HCL (PF) 1 % IJ SOLN
INTRAMUSCULAR | Status: AC
  Filled 2011-05-12: qty 30

## 2011-05-12 MED ORDER — SODIUM CHLORIDE 0.9 % IV SOLN
0.2500 mg/kg/h | INTRAVENOUS | Status: DC
  Administered 2011-05-12: 0.25 mg/kg/h via INTRAVENOUS

## 2011-05-12 MED ORDER — PRASUGREL HCL 10 MG PO TABS
10.0000 mg | ORAL_TABLET | Freq: Every day | ORAL | Status: DC
Start: 2011-05-13 — End: 2011-05-14
  Administered 2011-05-13 – 2011-05-14 (×2): 10 mg via ORAL
  Filled 2011-05-12 (×2): qty 1

## 2011-05-12 MED ORDER — METOPROLOL TARTRATE 12.5 MG HALF TABLET
12.5000 mg | ORAL_TABLET | Freq: Two times a day (BID) | ORAL | Status: DC
  Administered 2011-05-12 – 2011-05-14 (×5): 12.5 mg via ORAL
  Filled 2011-05-12 (×6): qty 1

## 2011-05-12 MED ORDER — ASPIRIN EC 81 MG PO TBEC
81.0000 mg | DELAYED_RELEASE_TABLET | Freq: Every day | ORAL | Status: DC
  Administered 2011-05-13 – 2011-05-14 (×2): 81 mg via ORAL
  Filled 2011-05-12 (×2): qty 1

## 2011-05-12 MED ORDER — PREGABALIN 25 MG PO CAPS
25.0000 mg | ORAL_CAPSULE | Freq: Two times a day (BID) | ORAL | Status: DC
  Administered 2011-05-12 – 2011-05-14 (×4): 25 mg via ORAL
  Filled 2011-05-12 (×5): qty 1

## 2011-05-12 MED ORDER — ZOLPIDEM TARTRATE 5 MG PO TABS
5.0000 mg | ORAL_TABLET | Freq: Every evening | ORAL | Status: DC | PRN
  Administered 2011-05-12: 5 mg via ORAL
  Filled 2011-05-12: qty 1

## 2011-05-12 NOTE — ED Provider Notes (Signed)
History     CSN: [id number]  Arrival date & time 05/12/11  0620   First MD Initiated Contact with Patient upon arrival to the emergency department with EMS with cardiology present in the emergency department at the patient's bedside as well upon arrival with prehospital ECG reviewed upon arrival confirming STEMI     Chief Complaint  Patient presents with  . Chest Pain    (Consider location/radiation/quality/duration/timing/severity/associated sxs/prior treatment) HPI This 71 year old male has a history of chronic neck and back pain but denies any history of coronary artery disease. He denies any recent illnesses or other concerns until this morning. He woke up this morning with a mild chest discomfort. He had some nausea and diaphoresis as well. He felt lightheaded this morning but did not have syncope or trauma. He has no localizing lateralizing weakness or numbness. He is not short of breath. He has no cough or fever. He has not felt like this before. His symptoms have been present for less than one hour prior to arrival. His prehospital ECG showed inferior lead ST segment elevation and a code STEMI was called by EMS. He received 4 baby aspirin prior to arrival as well as morphine and nitroglycerin in his discomfort is improving but now resolved yet. His chest discomfort is mild. It is without radiation. Review of systems is otherwise unobtainable due to severe illness including hypotension upon arrival. Past Medical History  Diagnosis Date  . Lung cancer     new dx of lung ca  . Depression   . Erectile dysfunction    chronic neck and back pain  Past Surgical History  Procedure Date  . Tonsillectomy   . Adenoidectomy   . Ankle fracture surgery   . Cervical disc surgery     3 level fusion with Cadaver Maurine Minister June 2009 successful.  No complications  . Fiberoptic bronchoscopy with endobronchial 09/23/2010  . L1 vertebroplasty and l1 core biopsy. 08/18/2010  . Right thigh abscess  incision and drainage. 05/04/2010  . Right inguinal hernia repair with extended prolene hernia 12/24/2008  . Decompressive anterior cervical diskectomy, c5-c6 and c6-c7. 08/20/2007    History reviewed. No pertinent family history.  History  Substance Use Topics  . Smoking status: Former Games developer  . Smokeless tobacco: Never Used  . Alcohol Use: No     ETOH free x 15+ yrs      Review of Systems  Unable to perform ROS: Other    Allergies  Review of patient's allergies indicates no known allergies.  Home Medications   No current outpatient prescriptions on file.  BP 108/55  Pulse 88  Temp(Src) 97.3 F (36.3 C) (Oral)  Resp 16  Ht 5\' 10"  (1.778 m)  Wt 220 lb 7.4 oz (100 kg)  BMI 31.63 kg/m2  SpO2 97%  Physical Exam  Nursing note and vitals reviewed. Constitutional:       Awake, alert, nontoxic appearance.  HENT:  Head: Atraumatic.  Eyes: Right eye exhibits no discharge. Left eye exhibits no discharge.  Neck: Neck supple.  Cardiovascular: Normal rate and regular rhythm.   No murmur heard. Pulmonary/Chest: Effort normal and breath sounds normal. No respiratory distress. He has no wheezes. He has no rales. He exhibits no tenderness.  Abdominal: Soft. There is no tenderness. There is no rebound.  Musculoskeletal: He exhibits no edema and no tenderness.       Baseline ROM, no obvious new focal weakness. Pulses intact in all 4 extremities  Neurological:  Mental status and motor strength appears baseline for patient and situation.  Skin: No rash noted.  Psychiatric: He has a normal mood and affect.    ED Course  Procedures (including critical care time)  CRITICAL CARE Performed by: Hurman Horn   Total critical care time:  Critical care time was exclusive of separately billable procedures and treating other patients.  Critical care was necessary to treat or prevent imminent or life-threatening deterioration.  Critical care was time spent personally  by me on the following activities: development of treatment plan with patient and/or surrogate as well as nursing, discussions with consultants, evaluation of patient's response to treatment, examination of patient, obtaining history from patient or surrogate, ordering and performing treatments and interventions, ordering and review of laboratory studies, ordering and review of radiographic studies, pulse oximetry and re-evaluation of patient's condition. ECG: Sinus rhythm with ventricular rate 66, normal axis, normal intervals, ST segment elevation present in inferior leads with ST depression present laterally to as well as V1 and V2 consistent with acute inferior myocardial infarction new compared with August 2012  Labs Reviewed  CBC - Abnormal; Notable for the following:    WBC 10.7 (*)    All other components within normal limits  DIFFERENTIAL - Abnormal; Notable for the following:    Neutrophils Relative 34 (*)    Lymphocytes Relative 53 (*)    Lymphs Abs 5.7 (*)    All other components within normal limits  BASIC METABOLIC PANEL - Abnormal; Notable for the following:    Potassium 3.1 (*)    Glucose, Bld 120 (*)    GFR calc non Af Amer 83 (*)    All other components within normal limits  CARDIAC PANEL(CRET KIN+CKTOT+MB+TROPI) - Abnormal; Notable for the following:    CK, MB 7.4 (*)    Troponin I 1.11 (*)    Relative Index 6.6 (*)    All other components within normal limits  CARDIAC PANEL(CRET KIN+CKTOT+MB+TROPI) - Abnormal; Notable for the following:    Total CK 532 (*)    CK, MB 46.0 (*) CRITICAL VALUE NOTED.  VALUE IS CONSISTENT WITH PREVIOUSLY REPORTED AND CALLED VALUE.   Troponin I 8.18 (*)    Relative Index 8.6 (*)    All other components within normal limits  CARDIAC PANEL(CRET KIN+CKTOT+MB+TROPI) - Abnormal; Notable for the following:    Total CK 531 (*)    CK, MB 39.7 (*) CRITICAL VALUE NOTED.  VALUE IS CONSISTENT WITH PREVIOUSLY REPORTED AND CALLED VALUE.   Troponin I  8.92 (*)    Relative Index 7.5 (*)    All other components within normal limits  PROTIME-INR  APTT  CARDIAC PANEL(CRET KIN+CKTOT+MB+TROPI)  MRSA PCR SCREENING  HEMOGLOBIN A1C  CARDIAC PANEL(CRET KIN+CKTOT+MB+TROPI)  LIPID PANEL  CARDIAC PANEL(CRET KIN+CKTOT+MB+TROPI)   Dg Chest Port 1 View  05/12/2011  *RADIOLOGY REPORT*  Clinical Data: Myocardial infarction, coronary stenting  PORTABLE CHEST - 1 VIEW  Comparison: 01/05/2011  Findings: Stable cardiac enlargement without CHF, pneumonia, collapse, consolidation, large effusion or pneumothorax.  Minimal right base atelectasis / scarring.  Lower cervical fusion hardware noted.  IMPRESSION: Negative for CHF or pneumonia.  Stable cardiomegaly.  Original Report Authenticated By: Judie Petit. Ruel Favors, M.D.     1. STEMI (ST elevation myocardial infarction)       MDM  As soon as Cath Lab ready Pt transported.        Hurman Horn, MD 05/13/11 [phone]

## 2011-05-12 NOTE — Progress Notes (Signed)
CRITICAL VALUE ALERT  Critical value received:  Ckmb 7.4  Trop 1.11  Date of notification:  05/12/2011  Time of notification:  1030  Critical value read back:yes  Nurse who received alert:  Alta Corning  MD notified (1st page):  turner  Time of first page:  1035  MD notified (2nd page):  Time of second page:  Responding MD:  Mayford Knife  Time MD responded:  54  Turner Md stated she did not need to be called with other ckmb and trop values.

## 2011-05-12 NOTE — H&P (Addendum)
Physician History and Physical    Tyler Mayo MRN: [id number] DOB/AGE: May 11, 1940 71 y.o. Admit date: 05/12/2011  Primary Care Physician:  Dr. Alonza Smoker Primary Cardiologist:  Charlton Haws, MD  CC:  Chest pain/STEMI  HPI:  Pt is a 71 yo man with prior hx TIA, alcoholism, chronic low back pain who presents with substernal chest pressure that awoke him from sleep this am at 6am.  It was associated with sweatiness.  He had one episode of chest pain about 6 months ago with negative cardiac enzymes.  He has been active recently without chest pain. He exercises on a daily basis.  He received aspirin, nitro and morphine by EMS, he continues to have chest pain.    Review of systems: A review of 10 organ systems was done and is negative except as stated above in HPI  Past Medical History  Diagnosis Date  . Lung cancer     new dx of lung ca  . Depression   . Erectile dysfunction    Past Surgical History  Procedure Date  . Tonsillectomy   . Adenoidectomy   . Ankle fracture surgery   . Cervical disc surgery     3 level fusion with Cadaver Maurine Minister June 2009 successful.  No complications  . Fiberoptic bronchoscopy with endobronchial 09/23/2010  . L1 vertebroplasty and l1 core biopsy. 08/18/2010  . Right thigh abscess incision and drainage. 05/04/2010  . Right inguinal hernia repair with extended prolene hernia 12/24/2008  . Decompressive anterior cervical diskectomy, c5-c6 and c6-c7. 08/20/2007   History   Social History  . Marital Status: Married    Spouse Name: N/A    Number of Children: N/A  . Years of Education: N/A   Occupational History  . Not on file.   Social History Main Topics  . Smoking status: Former Games developer  . Smokeless tobacco: Never Used  . Alcohol Use: No     ETOH free x 15+ yrs  . Drug Use: No  . Sexually Active: Not on file   Other Topics Concern  . Not on file   Social History Narrative   Regular exercise    Fam hx: no fam hx early CAD  No  Known Allergies   (Not in a hospital admission)  No current facility-administered medications for this encounter. Current outpatient prescriptions:aspirin 81 MG tablet, Take 81 mg by mouth daily.  , Disp: , Rfl: ;  clonazePAM (KLONOPIN) 0.5 MG tablet, Take 0.5 mg by mouth 2 (two) times daily as needed.  , Disp: , Rfl: ;  HYDROcodone-acetaminophen (NORCO) 5-325 MG per tablet, as needed., Disp: , Rfl: ;  LYRICA 25 MG capsule, Take 2 tablets by mouth Daily., Disp: , Rfl: ;  NITROSTAT 0.3 MG SL tablet, , Disp: , Rfl:  NUEDEXTA 20-10 MG CAPS, Take 2 tablets by mouth Daily., Disp: , Rfl: ;  tadalafil (CIALIS) 20 MG tablet, Take 20 mg by mouth daily as needed.  , Disp: , Rfl: ;  VIIBRYD 10 MG TABS, Take 1 tablet by mouth Daily., Disp: , Rfl:   Physical Exam: There were no vitals taken for this visit.; There is no height or weight on file to calculate BMI.    No intake or output data in the 24 hours ending 05/12/11 0629 General: in mild distress, pale and ashen Heent: dry MM Neck: No JVD  CV: Nondisplaced PMI.  RRR, nl S1/S2, no S3/S4, no murmur Lungs: Clear to auscultation bilaterally with normal  respiratory effort Abdomen: Soft, nontender, nondistended Extremities: No clubbing or cyanosis.  Normal pedal pulses. No pedal edema Skin: Intact without lesions or rashes  Neurologic: Alert and oriented x 3, grossly nonfocal  Psych: anxious    Labs: No results found for this basename: CKTOTAL:4,CKMB:4,TROPONINI:4 in the last 72 hours Lab Results  Component Value Date   WBC 7.4 01/05/2011   HGB 15.1 01/05/2011   HCT 43.7 01/05/2011   MCV 94.8 01/05/2011   PLT 219 01/05/2011   No results found for this basename: NA,K,CL,CO2,BUN,CREATININE,CALCIUM,LABALBU,PROT,BILITOT,ALKPHOS,ALT,AST,GLUCOSE in the last 168 hours Lab Results  Component Value Date   CHOL 179 11/02/2008   HDL 29.20* 11/02/2008   LDLCALC 119* 11/02/2008   TRIG 153.0* 11/02/2008       EKG: sinus, infpost stemi  Radiology:  No  results found.  ASSESSMENT:  Pt is a 71 yo man with prior hx TIA, alcoholism, chronic low back pain who presents with substernal chest pressure - EKG shows Inferoposterior STEMI  PLAN:  - labs pending - Pt to be taken to cath lab emergently for revascularization   Signed: Hilary Hertz, MD Cardiology Fellow 05/12/2011, 6:29 AM

## 2011-05-12 NOTE — Progress Notes (Signed)
Patient advised that home meds will have to be sent home and cannot remain with him on the hopital. Patient takes an off formulary medication and does not wish to have pharmacy to dispense his meds while he is in hospital. Patient stated that he will "be ok" and will take medication once returning home. Pharmacy advised of patients decision. Will continue to monitor

## 2011-05-12 NOTE — Progress Notes (Signed)
Pharmacist - Physician Communication  Concerning - Availability of a Routine Non-Formulary Medication  Description: The patient has an order for Nuedexta, which is Non-Formulary  To prevent interruption of for a routine medication:  The patient has been asked to bring the medication into the hospital.  The patient refused to allow the hospital to obtain & dispense his home supply while he is in hospital.  He states he does not want this medication while he is in the hospital.   Pharmacy can order this medication to arrive within 72 hours per our non-formulary policy.  This medication has NOT been ordered since patient adamant he does not want to take while in hospital and will resume after discharged.  Recommendation: Please feel free to contact the pharmacy at [phone] for assistance or if you anticipate a length of stay that requires this medication to be ordered for the patient.   Junita Push, PharmD, BCPS

## 2011-05-12 NOTE — ED Notes (Addendum)
EMS was called to house for syncopal episode.  Pt awoke at 0400 "feeling funny".  PT slid down the chair, but did not hit head or actually pass out.  When EMS arrived, pt was cool, diaphoretic, c/o light chest pressure, sternal.  1 81 MG ASA prior to ems and then 3 81 mg tabs w/ ems.  Pt given 4 morphine, 1 nitro prior to ems arrival and 2 0.4 mg nitro.

## 2011-05-12 NOTE — Brief Op Note (Signed)
05/12/2011  7:34 AM  PATIENT:  Tyler Mayo  71 y.o. male  PRE-OPERATIVE DIAGNOSIS:  STEMI  POST-OPERATIVE DIAGNOSIS:  * No post-op diagnosis entered *  PROCEDURE:  Procedure(s): LEFT HEART CATHETERIZATION WITH CORONARY ANGIOGRAM  SURGEON:  Surgeon(s): Lesleigh Noe, MD  PHYSICIAN ASSISTANT:   ASSISTANTSJudeth Cornfield   ANESTHESIA:   local with moderate sedation.

## 2011-05-12 NOTE — Op Note (Addendum)
Diagnostic Cardiac Catheterization and Emergency PCI Report  Tyler Mayo  71 y.o.  male Nov 17, 1940  Procedure Date: 05/12/2011 Referring Physician: Dr. Kelle Darting Primary Cardiologist:: Gwynneth Albright, M.D.   PROCEDURE:  Left heart catheterization with selective coronary angiography, left ventriculogram, and emergency DES RCA.  INDICATIONS:  Acute inferior ST elevation MI  The risks, benefits, and details of the procedure were explained to the patient.  The patient verbalized understanding and wanted to proceed.  Informed written consent was obtained under emergency circumstances.  PROCEDURE TECHNIQUE:  After Xylocaine anesthesia a 6 French sheath was placed in the right femoral artery with a single anterior needle wall stick.   Coronary angiography was done using a 6 [place] A2 MP catheter.  Left ventriculography was done using a 6 [place] JR 4 guide catheter by hand injection. The LV gram was done after coronary intervention.  PCI was performed using the 6 [place] JR 4 guide catheter. Anticoagulation was achieved with bivalirudin bolus and infusion with documented ACT greater than 600.  A 2.5 mm x 15 mm long balloon balloon was used to establish flow. The vessel was noted to have the thrombus burden and a thrombectomy run was performed using an [place] catheter. We then positioned and deployed a 22 mm long by 3.5 mm diameter resolute drug-eluting stent to 14 atmospheres. Postdilatation was performed with a 4.0 x 15 mm long [place]. TIMI grade 3 flow was noted post stenting. The patient developed moderate hypotension and recover with IV fluid administration.  Angio-Seal was used for hemostasis with out complications.    CONTRAST:  Total of 150 cc.  COMPLICATIONS:  None.    HEMODYNAMICS:  Aortic pressure was 80/60 mm mercury; LV pressure was 80/18 mmHg; LVEDP 24 mm mercury.  There was no gradient between the left ventricle and aorta.    ANGIOGRAPHIC DATA:   The  left main coronary artery is widely patent and normal.  The left anterior descending artery is 80% at the bifurcation with a large first septal perforator and the continuation of the proximal LAD. Moderate plaquing is noted in the mid vessel beyond that spot. The LAD reaches the left ventricular apex.  The left circumflex artery gives origin to 2 obtuse marginal branches with second being the largest..  The right coronary artery is tortuous and totally occluded in the midsegment. The distal right coronary fills by collaterals from the left coronary system via the circumflex via the left atrial recurrent branch.  LEFT VENTRICULOGRAM:  Left ventricular angiogram was done in the 30 RAO projection and revealed normal left ventricular wall motion and systolic function with an estimated ejection fraction of 55 % with inferobasal mild to moderate hypokinesia  PCI RESULTS:.  The totally occluded right coronary artery was reduced to 0% with TIMI grade 3 flow after angioplasty and thrombectomy followed by drug-eluting stent implantation post dilated to 4.0 mm in diameter.  IMPRESSIONS:  1. Acute inferior myocardial infarction with ST elevation treated with drug-eluting stent from 100% to 0% with TIMI grade 3 flow.  2. Residual severe LAD stenosis in the proximal/mid segment at the bifurcation of the LAD into a large subtle perforator and the continuation of the LAD.  3. Widely patent circumflex  4. Relatively well-preserved LV systolic contractility with mild inferior hypokinesis, an EF greater than 55%.  5. Hypotension secondary to inferior infarction with ?RV involvement.   RECOMMENDATION:  1. Bivalirudin x60 minutes at the reduced 0.25 mcg rate  2. EFFIENT and aspirin therapy.  3. Consideration of LAD intervention at some later date.

## 2011-05-12 NOTE — ED Notes (Signed)
Dr Katrinka Blazing from [facility] at bedside.

## 2011-05-13 LAB — CARDIAC PANEL(CRET KIN+CKTOT+MB+TROPI)
CK, MB: 39.7 ng/mL (ref 0.3–4.0)
Troponin I: 8.92 ng/mL (ref <0.30)

## 2011-05-13 LAB — HEMOGLOBIN A1C
Hgb A1c MFr Bld: 5.6 % (ref <5.7)
Mean Plasma Glucose: 114 mg/dL (ref <117)

## 2011-05-13 LAB — LIPID PANEL: HDL: 40 mg/dL (ref >39)

## 2011-05-13 NOTE — Progress Notes (Signed)
Alert and oriented but non compliant pt refuses vital signs every hour. I have explained why vitals signs  needs to be taken that often but pt is adamant. Will monitor pt closely

## 2011-05-13 NOTE — Progress Notes (Signed)
SUBJECTIVE:  Up moving around   OBJECTIVE:   Vitals:   Filed Vitals:   05/13/11 0038 05/13/11 0328 05/13/11 0429 05/13/11 0508  BP: 108/55 99/62 113/65   Pulse:      Temp: 97.3 F (36.3 C) 97.2 F (36.2 C)    TempSrc: Oral Oral    Resp:  16 16   Height:      Weight:    102.6 kg (226 lb 3.1 oz)  SpO2: 97% 97% 96%    I&O's:   Intake/Output Summary (Last 24 hours) at 05/13/11 0827 Last data filed at 05/12/11 1505  Gross per 24 hour  Intake 556.25 ml  Output   1400 ml  Net -843.75 ml   TELEMETRY: Reviewed telemetry pt in NSR     PHYSICAL EXAM General: Well developed, well nourished, in no acute distress Head: Eyes PERRLA, No xanthomas.   Normal cephalic and atramatic  Lungs:   Clear bilaterally to auscultation and percussion. Heart:   HRRR S1 S2 Pulses are 2+ & equal.            No carotid bruit. No JVD.  No abdominal bruits. No femoral bruits. Abdomen: Bowel sounds are positive, abdomen soft and non-tender without masses Extremities:   No clubbing, cyanosis or edema.  DP +1 Neuro: Alert and oriented X 3. Psych:  Good affect, responds appropriately   LABS: Basic Metabolic Panel:  Basename 05/12/11 0632  NA 141  K 3.1*  CL 105  CO2 24  GLUCOSE 120*  BUN 11  CREATININE 0.98  CALCIUM 9.2  MG --  PHOS --   CBC:  Basename 05/12/11 0632  WBC 10.7*  NEUTROABS 3.7  HGB 14.7  HCT 42.8  MCV 94.3  PLT 242   Cardiac Enzymes:  Basename 05/12/11 2356 05/12/11 1730 05/12/11 0854  CKTOTAL 531* 532* 112  CKMB 39.7* 46.0* 7.4*  CKMBINDEX -- -- --  TROPONINI 8.92* 8.18* 1.11*    Basename 05/13/11 0515  CHOL 190  HDL 40  LDLCALC 121*  TRIG 144  CHOLHDL 4.8  LDLDIRECT --   Coag Panel:   Lab Results  Component Value Date   INR 1.05 05/12/2011   INR 1.03 01/05/2011   INR 0.97 09/19/2010    RADIOLOGY: Dg Chest Port 1 View  05/12/2011  *RADIOLOGY REPORT*  Clinical Data: Myocardial infarction, coronary stenting  PORTABLE CHEST - 1 VIEW  Comparison: 01/05/2011   Findings: Stable cardiac enlargement without CHF, pneumonia, collapse, consolidation, large effusion or pneumothorax.  Minimal right base atelectasis / scarring.  Lower cervical fusion hardware noted.  IMPRESSION: Negative for CHF or pneumonia.  Stable cardiomegaly.  Original Report Authenticated By: Judie Petit. Ruel Favors, M.D.      ASSESSMENT:  1.  Inferior STEMI s/p emergent PCI with DES of RCA now on ASA/Effient 2.  CAD with occluded RCA s/p PCI with residual severe LAD disese.  3.  Preserved LVF with EF 55% and mild inferior HK 4.  History of lung CA  PLAN:   1.  Transfer to tele bed today 2.  Cardiac rehab 3.  Timing of LAD intervention to be determined by  Cards  Quintella Reichert, MD  05/13/2011  8:27 AM

## 2011-05-14 ENCOUNTER — Encounter (HOSPITAL_COMMUNITY): Payer: Self-pay | Admitting: Nurse Practitioner

## 2011-05-14 ENCOUNTER — Telehealth: Payer: Self-pay | Admitting: Cardiovascular Disease

## 2011-05-14 DIAGNOSIS — I2119 ST elevation (STEMI) myocardial infarction involving other coronary artery of inferior wall: Secondary | ICD-10-CM

## 2011-05-14 DIAGNOSIS — E785 Hyperlipidemia, unspecified: Secondary | ICD-10-CM

## 2011-05-14 LAB — POCT ACTIVATED CLOTTING TIME: Activated Clotting Time: 821 seconds

## 2011-05-14 MED ORDER — NITROGLYCERIN 0.4 MG SL SUBL: 0.4000 mg | SUBLINGUAL_TABLET | SUBLINGUAL | Status: DC | PRN

## 2011-05-14 MED ORDER — METOPROLOL TARTRATE 25 MG PO TABS: 12.5000 mg | ORAL_TABLET | Freq: Two times a day (BID) | ORAL | Status: DC

## 2011-05-14 MED ORDER — PRASUGREL HCL 10 MG PO TABS: 10.0000 mg | ORAL_TABLET | Freq: Every day | ORAL | Status: DC

## 2011-05-14 MED ORDER — ATORVASTATIN CALCIUM 80 MG PO TABS: 80.0000 mg | ORAL_TABLET | Freq: Every day | ORAL | Status: DC

## 2011-05-14 NOTE — Progress Notes (Signed)
UR Completed. Simmons, Devantae Babe F [phone]  

## 2011-05-14 NOTE — Progress Notes (Signed)
Cardiac Rehab [id number] Education completed with pt and wife. Permission given to refer to [facility] Phase 2. Pt understands Phase 2 will not start until staged PCI.   Ophia Shamoon DunlapRN

## 2011-05-14 NOTE — Telephone Encounter (Signed)
Here you go

## 2011-05-14 NOTE — Plan of Care (Signed)
Problem: Phase II Progression Outcomes Goal: Vascular site scale level 0 - I Vascular Site Scale Level 0: No bruising/bleeding/hematoma Level I (Mild): Bruising/Ecchymosis, minimal bleeding/ooozing, palpable hematoma < 3 cm Level II (Moderate): Bleeding not affecting hemodynamic parameters, pseudoaneurysm, palpable hematoma > 3 cm  Outcome: Completed/Met Date Met:  05/14/11 Level 1

## 2011-05-14 NOTE — Progress Notes (Signed)
Patient ID: Tyler Mayo, male   DOB: February 06, 1941, 71 y.o.   MRN: [id number] SUBJECTIVE:  Up moving around  Wants to go home No SSCP  OBJECTIVE:   Vitals:   Filed Vitals:   05/13/11 1018 05/13/11 1331 05/13/11 2100 05/14/11 0600  BP: 107/68 101/65 102/54 113/71  Pulse: 78 83 73 78  Temp: 98.6 F (37 C) 98.9 F (37.2 C) 97.8 F (36.6 C) 98 F (36.7 C)  TempSrc: Oral Oral    Resp: 19 20 16 18   Height: 5\' 10"  (1.778 m)     Weight:      SpO2: 94% 97% 95% 96%   I&O's:    Intake/Output Summary (Last 24 hours) at 05/14/11 0914 Last data filed at 05/13/11 1333  Gross per 24 hour  Intake    240 ml  Output      0 ml  Net    240 ml   TELEMETRY: Reviewed telemetry pt in NSR     PHYSICAL EXAM General: Well developed, well nourished, in no acute distress Head: Eyes PERRLA, No xanthomas.   Normal cephalic and atramatic  Lungs:   Clear bilaterally to auscultation and percussion. Heart:   HRRR S1 S2 Pulses are 2+ & equal.            No carotid bruit. No JVD.  No abdominal bruits. No femoral bruits. Abdomen: Bowel sounds are positive, abdomen soft and non-tender without masses Extremities:   No clubbing, cyanosis or edema.  DP +1 Neuro: Alert and oriented X 3. Psych:  Good affect, responds appropriately Cath site small echymosis  LABS: Basic Metabolic Panel:  [facility] 05/12/11 0632  NA 141  K 3.1*  CL 105  CO2 24  GLUCOSE 120*  BUN 11  CREATININE 0.98  CALCIUM 9.2  MG --  PHOS --   CBC:  Basename 05/12/11 0632  WBC 10.7*  NEUTROABS 3.7  HGB 14.7  HCT 42.8  MCV 94.3  PLT 242   Cardiac Enzymes:  Basename 05/12/11 2356 05/12/11 1730 05/12/11 0854  CKTOTAL 531* 532* 112  CKMB 39.7* 46.0* 7.4*  CKMBINDEX -- -- --  TROPONINI 8.92* 8.18* 1.11*    Basename 05/13/11 0515  CHOL 190  HDL 40  LDLCALC 121*  TRIG 144  CHOLHDL 4.8  LDLDIRECT --   Coag Panel:   Lab Results  Component Value Date   INR 1.05 05/12/2011   INR 1.03 01/05/2011   INR 0.97 09/19/2010      RADIOLOGY: Dg Chest Port 1 View  05/12/2011  *RADIOLOGY REPORT*  Clinical Data: Myocardial infarction, coronary stenting  PORTABLE CHEST - 1 VIEW  Comparison: 01/05/2011  Findings: Stable cardiac enlargement without CHF, pneumonia, collapse, consolidation, large effusion or pneumothorax.  Minimal right base atelectasis / scarring.  Lower cervical fusion hardware noted.  IMPRESSION: Negative for CHF or pneumonia.  Stable cardiomegaly.  Original Report Authenticated By: Judie Petit. Ruel Favors, M.D.      ASSESSMENT:  1.  Inferior STEMI s/p emergent PCI with DES of RCA now on ASA/Effient 2.  CAD with occluded RCA s/p PCI with residual severe LAD disese.  3.  Preserved LVF with EF 55% and mild inferior HK 4.  History of lung CA  PLAN:   Reviewed films with Dr Excell Seltzer.  Ok to D/C Bring back electively for PCI LAD in 3 weeks with Dr Excell Seltzer.  ASA and Effient Ok to start low level cardiac rehab.  Continue statin check lipids in 3 months.   F/U Tereso Newcomer  in 2 weeks and then schedule PCI with Excell Seltzer in 3 weeks.  Charlton Haws, MD  05/14/2011  9:14 AM

## 2011-05-14 NOTE — Discharge Summary (Signed)
Patient ID: Tyler Mayo,  MRN: [id number], DOB/AGE: 1940-08-14 71 y.o.  Admit date: 05/12/2011 Discharge date: 05/14/2011  Primary Care Provider: Tinnie Gens AllenTodd Primary Cardiologist: Charlton Haws  Discharge Diagnoses Principal Problem:  *ST elevation myocardial infarction (STEMI) of inferior wall, initial episode of care Active Problems:  CAD (coronary artery disease)  DEPRESSION  Hyperlipidemia   Allergies No Known Allergies  Procedures  05/12/2011 - Left Heart Cardiac Catheterization and Percutaneous Coronary Intervention HEMODYNAMICS:  Aortic pressure was 80/60 mm mercury; LV pressure was 80/18 mmHg; LVEDP 24 mm mercury.  There was no gradient between the left ventricle and aorta.    ANGIOGRAPHIC DATA:   The left main coronary artery is widely patent and normal.  The left anterior descending artery is 80% at the bifurcation with a large first septal perforator and the continuation of the proximal LAD. Moderate plaquing is noted in the mid vessel beyond that spot. The LAD reaches the left ventricular apex.  The left circumflex artery gives origin to 2 obtuse marginal branches with second being the largest..  The right coronary artery is tortuous and totally occluded in the midsegment. The distal right coronary fills by collaterals from the left coronary system via the circumflex via the left atrial recurrent branch.  LEFT VENTRICULOGRAM:  Left ventricular angiogram was done in the 30 RAO projection and revealed normal left ventricular wall motion and systolic function with an estimated ejection fraction of 55 % with inferobasal mild to moderate hypokinesia    **The RCA was treated with atherectomy and placement of a 3.5 x 22mm Resolute     Drug-Eluting Stent**  _____________________________________________________________________________________________  History of Present Illness  71 year old male with a prior history of chest pain and normal stress test in the summer of  2012 who was in his usual state of health until the morning of February 2 when he woke up with substernal chest pressure and diaphoresis. Patient called EMS and was treated with aspirin and nitroglycerin. His ECG showed inferior ST segment elevation and he was taken emergently to the [facility] this was confirmed. Patient was treated with aspirin, nitroglycerin, and morphine, and taken to the cath lab for emergent catheterization.  Hospital Course   Patient underwent emergent diagnostic cardiac catheterization revealing a total occlusion of the right coronary artery. This was felt to be the infarct vessel. Patient also had 80% stenosis within the LAD. Attention was turned to the right coronary artery which was successfully treated with aspiration thrombectomy followed by placement of a 3.5 x 22 mm Resolute drug eluting stent. Patient tolerated this procedure well and post procedure is monitored in the coronary intensive care unit where he eventually peaked his CK at 532, MB of 46, and troponin I at 8.92. He was placed on aspirin, FE and, beta blocker, and high-dose statin therapy. He had no further chest discomfort and was transferred out to the floor on February 3. His been doing without difficulty.  As noted above, she has residual disease in the LAD. Films were reviewed and it was felt that patient would require percutaneous intervention. This is scheduled for February 22. Patient will be discharged home today in good condition.  Discharge Vitals:  Blood pressure 113/71, pulse 78, temperature 98 F (36.7 C), temperature source Oral, resp. rate 18, height 5\' 10"  (1.778 m), weight 226 lb 3.1 oz (102.6 kg), SpO2 96.00%.    Labs: CBC:  Basename 05/12/11 0632  WBC 10.7*  NEUTROABS 3.7  HGB 14.7  HCT 42.8  MCV  94.3  PLT 242   Basic Metabolic Panel:  Basename 05/12/11 0632  NA 141  K 3.1*  CL 105  CO2 24  GLUCOSE 120*  BUN 11  CREATININE 0.98  CALCIUM 9.2  MG --  PHOS --    Cardiac Enzymes:  Basename 05/12/11 2356 05/12/11 1730 05/12/11 0854  CKTOTAL 531* 532* 112  CKMB 39.7* 46.0* 7.4*  CKMBINDEX -- -- --  TROPONINI 8.92* 8.18* 1.11*   Hemoglobin A1C:  Basename 05/13/11 0515  HGBA1C 5.6   Fasting Lipid Panel:  Basename 05/13/11 0515  CHOL 190  HDL 40  LDLCALC 121*  TRIG 144  CHOLHDL 4.8  LDLDIRECT --   Disposition:  Follow-up Information    Follow up with TODD,JEFFREY ALLEN, MD. (As scheduled)       Follow up with Tereso Newcomer, PA on 05/28/2011. (11:30)    Contact information:   [address] [place] [address] [phone]       Follow up with [facility] on 06/01/2011. (7:00am - For scheduled cardiac intervention with Dr. Tonny Bollman (procedure scheduled for 9:00am))    Contact information:   [facility] [address] [place]         Discharge Medications:  Medication List  As of 05/14/2011 11:27 AM   STOP taking these medications         tadalafil 20 MG tablet         TAKE these medications         aspirin 81 MG tablet   Take 81 mg by mouth daily.      atorvastatin 80 MG tablet   Commonly known as: LIPITOR   Take 1 tablet (80 mg total) by mouth daily.      clonazePAM 0.5 MG tablet   Commonly known as: KLONOPIN   Take 0.5 mg by mouth 2 (two) times daily as needed. For anxiety      HYDROcodone-acetaminophen 5-325 MG per tablet   Commonly known as: NORCO   Take 1 tablet by mouth every 4 (four) hours as needed. For pain      metoprolol tartrate 25 MG tablet   Commonly known as: LOPRESSOR   Take 0.5 tablets (12.5 mg total) by mouth 2 (two) times daily.      nitroGLYCERIN 0.4 MG SL tablet   Commonly known as: NITROSTAT   Place 1 tablet (0.4 mg total) under the tongue every 5 (five) minutes as needed. For chest pain      NUEDEXTA 20-10 MG Caps   Generic drug: Dextromethorphan-Quinidine   Take 2 tablets by mouth Daily.      prasugrel 10 MG Tabs   Commonly  known as: EFFIENT   Take 1 tablet (10 mg total) by mouth daily.      pregabalin 25 MG capsule   Commonly known as: LYRICA   Take 25 mg by mouth 3 (three) times daily as needed. For pain      VIIBRYD 10 MG Tabs   Generic drug: Vilazodone HCl   Take 1 tablet by mouth Daily.            Outstanding Labs/Studies  Follow-up cbc, bmet, coags in preparation for repeat PCI when seen in office on 2/18. Follow-up lipids/lft's in 6-8 wks.  Duration of Discharge Encounter: Greater than 30 minutes including physician time.  Signed, Nicolasa Ducking NP 05/14/2011, 11:27 AM

## 2011-05-14 NOTE — Telephone Encounter (Signed)
Pt wants to thank for the way you scheduled the fu stent procedure on  06-01-11 and not immediately, FYI no need to call just wanted to say thanks

## 2011-05-17 MED FILL — Dextrose Inj 5%: INTRAVENOUS | Qty: 50 | Status: AC

## 2011-05-21 ENCOUNTER — Encounter: Payer: Self-pay | Admitting: *Deleted

## 2011-05-23 ENCOUNTER — Encounter (HOSPITAL_COMMUNITY): Payer: Self-pay | Admitting: Pharmacy Technician

## 2011-05-24 NOTE — Telephone Encounter (Signed)
Spoke with pt, he has a lot of questions about the cath he just had and the PCI he is scheduled for on 06-01-11.he is seeing scott we[facility] pac on Monday and dr cooper is also in the office and will answer all his questions at that time.

## 2011-05-24 NOTE — Telephone Encounter (Signed)
New Msg: Pt calling wanting to speak with Lorin Picket about what pt is going to discuss at pt Monday appt. Pt said he has a few questions that may require some research. Please return pt call to discuss further.

## 2011-05-28 ENCOUNTER — Encounter: Payer: Self-pay | Admitting: Physician Assistant

## 2011-05-28 ENCOUNTER — Encounter: Payer: Self-pay | Admitting: *Deleted

## 2011-05-28 ENCOUNTER — Ambulatory Visit (INDEPENDENT_AMBULATORY_CARE_PROVIDER_SITE_OTHER): Payer: Medicare Other | Admitting: Physician Assistant

## 2011-05-28 DIAGNOSIS — E785 Hyperlipidemia, unspecified: Secondary | ICD-10-CM

## 2011-05-28 DIAGNOSIS — F419 Anxiety disorder, unspecified: Secondary | ICD-10-CM

## 2011-05-28 DIAGNOSIS — I2510 Atherosclerotic heart disease of native coronary artery without angina pectoris: Secondary | ICD-10-CM

## 2011-05-28 DIAGNOSIS — F411 Generalized anxiety disorder: Secondary | ICD-10-CM

## 2011-05-28 LAB — BASIC METABOLIC PANEL
BUN: 13 mg/dL (ref 6–23)
CO2: 28 mEq/L (ref 19–32)
Calcium: 9.2 mg/dL (ref 8.4–10.5)
Creatinine, Ser: 1.2 mg/dL (ref 0.4–1.5)

## 2011-05-28 LAB — CBC WITH DIFFERENTIAL/PLATELET
Basophils Absolute: 0 10*3/uL (ref 0.0–0.1)
Basophils Relative: 0.5 % (ref 0.0–3.0)
Eosinophils Absolute: 0.2 10*3/uL (ref 0.0–0.7)
Lymphocytes Relative: 29.2 % (ref 12.0–46.0)
MCHC: 34.1 g/dL (ref 30.0–36.0)
Neutrophils Relative %: 59.8 % (ref 43.0–77.0)
Platelets: 259 10*3/uL (ref 150.0–400.0)
RBC: 4.69 Mil/uL (ref 4.22–5.81)

## 2011-05-28 LAB — PROTIME-INR
INR: 1.1 ratio — ABNORMAL HIGH (ref 0.8–1.0)
Prothrombin Time: 11.6 s (ref 10.2–12.4)

## 2011-05-28 NOTE — H&P (Signed)
History and Physical  Date: 05/28/2011   Name: Tyler Mayo  DOB: July 15, 1940  MRN: [id number]   PCP: Dr. Tawanna Cooler  Primary Cardiologist: Dr. Charlton Haws  Primary Electrophysiologist: None  History of Present Illness:  Tyler Mayo is a 71 y.o. male who presents for post hospital follow up. He has a history of chronic back pain, lung nodules, status post biopsy 9/12 with benign findings, TIAs. Myoview 8/12: Negative for ischemia, EF 74%.   He was admitted 2/2-2/4 with an inferior STEMI. He presented with chest pain and diaphoresis that awoke him from sleep. Emergent LHC 05/12/11: LAD 80% proximal at the bifurcation of a large first septal perforator, RCA occluded mid, EF 55% with infero-basal mild to moderate hypokinesis. PCI: Atherectomy and a resolute DES to the RCA. Post PCI course was uneventful. Plan is for staged intervention of the LAD 06/01/11 with Dr. Excell Seltzer. Labs: Hemoglobin 14.7, potassium 3.1, creatinine 0.98, ALT 23, A1c 5.6, TC 190, HDL 40, LDL 121, TG 144. Chest x-ray negative for CHF or pneumonia, stable cardiomegaly.   Since d/c, he is doing well. The patient denies chest pain, shortness of breath, syncope, orthopnea, PND or significant pedal edema. He is quite anxious about his dx. He denies insomnia. Does have a h/o depression and anxiety and sees a psychiatrist.    Past Medical History   Diagnosis  Date   .  Lung nodules      s/p bx with Dr. [name] 9/12   .  Depression    .  Erectile dysfunction    .  CAD (coronary artery disease)      a. 8.2012 neg MV; b. 1.2013 Inf STEMI - 100% RCA - 3.5x22 Resolute DES, Residual 80% LAD dzs   .  Hyperlipidemia    .  History of tobacco abuse      Current Outpatient Prescriptions   Medication  Sig  Dispense  Refill   .  aspirin 81 MG tablet  Take 81 mg by mouth daily.     Marland Kitchen  atorvastatin (LIPITOR) 80 MG tablet  Take 80 mg by mouth daily.     .  clonazePAM (KLONOPIN) 0.5 MG tablet  Take 0.5 mg by mouth 2 (two) times daily as  needed. For anxiety     .  HYDROcodone-acetaminophen (NORCO) 5-325 MG per tablet  Take 1 tablet by mouth every 4 (four) hours as needed. For pain     .  metoprolol tartrate (LOPRESSOR) 25 MG tablet  Take 12.5 mg by mouth 2 (two) times daily.     .  nitroGLYCERIN (NITROSTAT) 0.4 MG SL tablet  Place 0.4 mg under the tongue every 5 (five) minutes as needed. For chest pain     .  NUEDEXTA 20-10 MG CAPS  Take 2 tablets by mouth Daily.     .  prasugrel (EFFIENT) 10 MG TABS  Take 10 mg by mouth daily.     .  pregabalin (LYRICA) 25 MG capsule  Take 25 mg by mouth 3 (three) times daily as needed. For pain     .  VIIBRYD 10 MG TABS  Take 1 tablet by mouth Daily.       Allergies:  No Known Allergies    History   Substance Use Topics   .  Smoking status:  Former Smoker     Types:  Cigarettes     Quit date:  05/12/2011   .  Smokeless tobacco:  Never Used     Comment: a  couple per week    .  Alcohol Use:  No      ETOH free x 15+ yrs     Family History  Problem Relation Age of Onset  . Alcohol abuse Father   . Lung cancer Father   . Heart disease Mother      ROS: Please see the history of present illness. All other systems reviewed and negative.   PHYSICAL EXAM:  VS: BP 95/60  Pulse 69  Ht 5\' 10"  (1.778 m)  Wt 221 lb (100.245 kg)  BMI 31.71 kg/m2  Well nourished, well developed, in no acute distress  HEENT: normal  Neck: no JVD  Cardiac: normal S1, S2; RRR; no murmur  Lungs: clear to auscultation bilaterally, no wheezing, rhonchi or rales  Abd: soft, nontender, no hepatomegaly  Ext: no edema, right groin without hematoma or bruit  Skin: warm and dry  Neuro: CNs 2-12 intact, no focal abnormalities noted   EKG: NSR, HR 64, inf infarct, iRBBB   ASSESSMENT AND PLAN:  CAD (coronary artery disease) - Tereso Newcomer, PA 05/28/2011 12:28 PM Addendum  Doing well s/p inf MI tx with DES to the RCA. He has residual LAD disease and the plan is for staged PCI of his LAD later this week.  Continue ASA and Effient. We discussed the importance of dual antiplatelet therapy. He will need cardiac rehab arranged after his PCI. Risks and benefits of cardiac catheterization have been discussed with the patient. These include bleeding, infection, kidney damage, stroke, heart attack, death. The patient understands these risks and is willing to proceed. Dr. Tonny Bollman has reviewed his films and also met with him today.  Previous Version  Hyperlipidemia - Tereso Newcomer, [place] 05/28/2011 12:25 PM Signed  Arrange FLP and LFTs in 6 weeks.   Anxiety - Tereso Newcomer, [place] 05/28/2011 12:26 PM Signed  I have suggested he follow up with his psychiatrist if his symptoms of anxiety continue. We discussed how post MI depression and anxiety is common. However, with his hx, he may need earlier follow up for adjustments in his medications if necessary.   Signed,  Tereso Newcomer, PA-C  1:11 PM 05/28/2011

## 2011-05-28 NOTE — Assessment & Plan Note (Addendum)
Doing well s/p inf MI tx with DES to the RCA.  He has residual LAD disease and the plan is for staged PCI of his LAD later this week.  Continue ASA and Effient.  We discussed the importance of dual antiplatelet therapy.  He will need cardiac rehab arranged after his PCI.   Risks and benefits of cardiac catheterization have been discussed with the patient.  These include bleeding, infection, kidney damage, stroke, heart attack, death.  The patient understands these risks and is willing to proceed.  Dr. Tonny Bollman has reviewed his films and also met with him today.

## 2011-05-28 NOTE — Assessment & Plan Note (Signed)
I have suggested he follow up with his psychiatrist if his symptoms of anxiety continue.  We discussed how post MI depression and anxiety is common.  However, with his hx, he may need earlier follow up for adjustments in his medications if necessary.

## 2011-05-28 NOTE — Patient Instructions (Signed)
Your physician has requested that you have a cardiac catheterization 06/01/11 WITH DR. Excell Seltzer. Cardiac catheterization is used to diagnose and/or treat various heart conditions. Doctors may recommend this procedure for a number of different reasons. The most common reason is to evaluate chest pain. Chest pain can be a symptom of coronary artery disease (CAD), and cardiac catheterization can show whether plaque is narrowing or blocking your heart's arteries. This procedure is also used to evaluate the valves, as well as measure the blood flow and oxygen levels in different parts of your heart. For further information please visit [email or web address] Please follow instruction sheet, as given.  Your physician recommends that you return for lab work in: TODAY BMET, CBC W/DIFF, PT/INR FOR PRE CATH LABS 06/01/11  Your physician recommends that you return for lab work in: 6 WEEKS WITH FASTING LIVER/LIPID AROUND 08/07/11

## 2011-05-28 NOTE — Assessment & Plan Note (Signed)
Arrange FLP and LFTs in 6 weeks.  

## 2011-05-28 NOTE — Progress Notes (Signed)
[address] [place], [place]  [address] Phone: [phone] Fax:  [phone]  Date:  05/28/2011   Name:  Tyler Mayo       DOB:  December 21, 1940 MRN:  [id number]  PCP:  Dr. Tawanna Cooler Primary Cardiologist:  Dr. Charlton Haws  Primary Electrophysiologist:  None    History of Present Illness: Tyler Mayo is a 71 y.o. male who presents for post hospital follow up.  He has a history of chronic back pain, lung nodules, status post biopsy 9/12 with benign findings, TIAs.  Myoview 8/12: Negative for ischemia, EF 74%.  He was admitted 2/2-2/4 with an inferior STEMI.  He presented with chest pain and diaphoresis that awoke him from sleep.  Emergent LHC 05/12/11: LAD 80% proximal at the bifurcation of a large first septal perforator, RCA occluded mid, EF 55% with infero-basal mild to moderate hypokinesis.  PCI: Atherectomy and a resolute DES to the RCA.  Post PCI course was uneventful.  Plan is for staged intervention of the LAD 06/01/11 with Dr. Excell Seltzer.  Labs: Hemoglobin 14.7, potassium 3.1, creatinine 0.98, ALT 23, A1c 5.6, TC 190, HDL 40, LDL 121, TG 144.  Chest x-ray negative for CHF or pneumonia, stable cardiomegaly.  Since d/c, he is doing well.  The patient denies chest pain, shortness of breath, syncope, orthopnea, PND or significant pedal edema.  He is quite anxious about his dx.  He denies insomnia.  Does have a h/o depression and anxiety and sees a psychiatrist.    Past Medical History  Diagnosis Date  . Lung nodules     s/p bx with Dr. [name] 9/12  . Depression   . Erectile dysfunction   . CAD (coronary artery disease)     a.  8.2012 neg MV;  b.  1.2013 Inf STEMI - 100% RCA - 3.5x22 Resolute DES, Residual 80% LAD dzs  . Hyperlipidemia   . History of tobacco abuse     Current Outpatient Prescriptions  Medication Sig Dispense Refill  . aspirin 81 MG tablet Take 81 mg by mouth daily.      Marland Kitchen atorvastatin (LIPITOR) 80 MG tablet Take 80 mg by mouth daily.        . clonazePAM (KLONOPIN) 0.5 MG tablet Take 0.5 mg by mouth 2 (two) times daily as needed. For anxiety      . HYDROcodone-acetaminophen (NORCO) 5-325 MG per tablet Take 1 tablet by mouth every 4 (four) hours as needed. For pain      . metoprolol tartrate (LOPRESSOR) 25 MG tablet Take 12.5 mg by mouth 2 (two) times daily.      . nitroGLYCERIN (NITROSTAT) 0.4 MG SL tablet Place 0.4 mg under the tongue every 5 (five) minutes as needed. For chest pain      . NUEDEXTA 20-10 MG CAPS Take 2 tablets by mouth Daily.      . prasugrel (EFFIENT) 10 MG TABS Take 10 mg by mouth daily.      . pregabalin (LYRICA) 25 MG capsule Take 25 mg by mouth 3 (three) times daily as needed. For pain      . VIIBRYD 10 MG TABS Take 1 tablet by mouth Daily.        Allergies: No Known Allergies  History  Substance Use Topics  . Smoking status: Former Smoker    Types: Cigarettes    Quit date: 05/12/2011  . Smokeless tobacco: Never Used   Comment: a couple per week  . Alcohol Use: No  ETOH free x 15+ yrs     ROS:  Please see the history of present illness.   All other systems reviewed and negative.   PHYSICAL EXAM: VS:  BP 95/60  Pulse 69  Ht 5\' 10"  (1.778 m)  Wt 221 lb (100.245 kg)  BMI 31.71 kg/m2 Well nourished, well developed, in no acute distress HEENT: normal Neck: no JVD Cardiac:  normal S1, S2; RRR; no murmur Lungs:  clear to auscultation bilaterally, no wheezing, rhonchi or rales Abd: soft, nontender, no hepatomegaly Ext: no edema, right groin without hematoma or bruit  Skin: warm and dry Neuro:  CNs 2-12 intact, no focal abnormalities noted  EKG:  NSR, HR 64, inf infarct, iRBBB  ASSESSMENT AND PLAN:

## 2011-05-28 NOTE — H&P (Signed)
The patient was independently evaluated. I agree with the documentation as per Natale Lay note. I had a long discussion with the patient and his wife about plans for PCI of his LAD. I have evaluated his cardiac catheterization films with Dr. Eden Emms. Will plan on repeat angiography and probable pressure wire analysis of the LAD. I suspect this will be significant as angiographically the LAD lesion looks hemodynamically significant in the range of 80%. Risks, indication, and alternatives were reviewed with the patient and he agrees to proceed.

## 2011-06-01 ENCOUNTER — Ambulatory Visit (HOSPITAL_COMMUNITY)
Admission: RE | Admit: 2011-06-01 | Discharge: 2011-06-02 | Disposition: A | Discharge status: Home / Self Care | Payer: Medicare Other | Source: Ambulatory Visit | Attending: Cardiovascular Disease | Admitting: Cardiovascular Disease

## 2011-06-01 ENCOUNTER — Encounter (HOSPITAL_COMMUNITY)
Admission: RE | Disposition: A | Discharge status: Home / Self Care | Payer: Self-pay | Source: Ambulatory Visit | Attending: Cardiovascular Disease

## 2011-06-01 ENCOUNTER — Other Ambulatory Visit: Payer: Self-pay

## 2011-06-01 ENCOUNTER — Encounter (HOSPITAL_COMMUNITY): Payer: Self-pay | Admitting: General Practice

## 2011-06-01 DIAGNOSIS — I2119 ST elevation (STEMI) myocardial infarction involving other coronary artery of inferior wall: Secondary | ICD-10-CM | POA: Insufficient documentation

## 2011-06-01 DIAGNOSIS — I2510 Atherosclerotic heart disease of native coronary artery without angina pectoris: Secondary | ICD-10-CM | POA: Insufficient documentation

## 2011-06-01 DIAGNOSIS — F17200 Nicotine dependence, unspecified, uncomplicated: Secondary | ICD-10-CM | POA: Insufficient documentation

## 2011-06-01 DIAGNOSIS — F411 Generalized anxiety disorder: Secondary | ICD-10-CM | POA: Insufficient documentation

## 2011-06-01 DIAGNOSIS — E785 Hyperlipidemia, unspecified: Secondary | ICD-10-CM | POA: Insufficient documentation

## 2011-06-01 HISTORY — PX: PERCUTANEOUS CORONARY STENT INTERVENTION (PCI-S): SHX5485

## 2011-06-01 HISTORY — DX: Other specified anxiety disorders: F41.8

## 2011-06-01 LAB — POCT ACTIVATED CLOTTING TIME: Activated Clotting Time: 446 seconds

## 2011-06-01 SURGERY — PERCUTANEOUS CORONARY STENT INTERVENTION (PCI-S)
Anesthesia: LOCAL

## 2011-06-01 MED ORDER — SODIUM CHLORIDE 0.9 % IJ SOLN: 3.0000 mL | Freq: Two times a day (BID) | INTRAMUSCULAR | Status: DC

## 2011-06-01 MED ORDER — ADENOSINE 12 MG/4ML IV SOLN
16.0000 mL | Freq: Once | INTRAVENOUS | Status: DC
  Filled 2011-06-01: qty 16

## 2011-06-01 MED ORDER — PRASUGREL HCL 10 MG PO TABS
10.0000 mg | ORAL_TABLET | ORAL | Status: AC
  Filled 2011-06-01: qty 1

## 2011-06-01 MED ORDER — PREGABALIN 25 MG PO CAPS: 25.0000 mg | ORAL_CAPSULE | Freq: Three times a day (TID) | ORAL | Status: DC | PRN

## 2011-06-01 MED ORDER — BIVALIRUDIN 250 MG IV SOLR
INTRAVENOUS | Status: AC
  Filled 2011-06-01: qty 250

## 2011-06-01 MED ORDER — PRASUGREL HCL 10 MG PO TABS
10.0000 mg | ORAL_TABLET | Freq: Every day | ORAL | Status: DC
  Administered 2011-06-02: 10 mg via ORAL
  Filled 2011-06-01 (×2): qty 1

## 2011-06-01 MED ORDER — ASPIRIN 81 MG PO CHEW
324.0000 mg | CHEWABLE_TABLET | Freq: Once | ORAL | Status: AC
  Administered 2011-06-01: 324 mg via ORAL
  Filled 2011-06-01: qty 4

## 2011-06-01 MED ORDER — DEXTROMETHORPHAN-QUINIDINE 20-10 MG PO CAPS: 2.0000 | ORAL_CAPSULE | Freq: Every day | ORAL | Status: DC

## 2011-06-01 MED ORDER — SODIUM CHLORIDE 0.9 % IJ SOLN: 3.0000 mL | INTRAMUSCULAR | Status: DC | PRN

## 2011-06-01 MED ORDER — SODIUM CHLORIDE 0.9 % IV SOLN: 250.0000 mL | INTRAVENOUS | Status: DC | PRN

## 2011-06-01 MED ORDER — ATORVASTATIN CALCIUM 80 MG PO TABS
80.0000 mg | ORAL_TABLET | Freq: Every day | ORAL | Status: DC
  Administered 2011-06-01 – 2011-06-02 (×2): 80 mg via ORAL
  Filled 2011-06-01 (×2): qty 1

## 2011-06-01 MED ORDER — MIDAZOLAM HCL 2 MG/2ML IJ SOLN
INTRAMUSCULAR | Status: AC
  Filled 2011-06-01: qty 2

## 2011-06-01 MED ORDER — NITROGLYCERIN 0.2 MG/ML ON CALL CATH LAB
INTRAVENOUS | Status: AC
  Filled 2011-06-01: qty 1

## 2011-06-01 MED ORDER — FENTANYL CITRATE 0.05 MG/ML IJ SOLN
INTRAMUSCULAR | Status: AC
  Filled 2011-06-01: qty 2

## 2011-06-01 MED ORDER — DIAZEPAM 5 MG PO TABS
10.0000 mg | ORAL_TABLET | Freq: Once | ORAL | Status: AC
  Administered 2011-06-01: 10 mg via ORAL

## 2011-06-01 MED ORDER — ONDANSETRON HCL 4 MG/2ML IJ SOLN: 4.0000 mg | Freq: Four times a day (QID) | INTRAMUSCULAR | Status: DC | PRN

## 2011-06-01 MED ORDER — LIDOCAINE HCL (PF) 1 % IJ SOLN
INTRAMUSCULAR | Status: AC
  Filled 2011-06-01: qty 30

## 2011-06-01 MED ORDER — CLONAZEPAM 0.5 MG PO TABS: 0.5000 mg | ORAL_TABLET | Freq: Two times a day (BID) | ORAL | Status: DC | PRN

## 2011-06-01 MED ORDER — SODIUM CHLORIDE 0.9 % IV SOLN
INTRAVENOUS | Status: AC
  Administered 2011-06-01: 11:00:00 via INTRAVENOUS

## 2011-06-01 MED ORDER — ACETAMINOPHEN 325 MG PO TABS: 650.0000 mg | ORAL_TABLET | ORAL | Status: DC | PRN

## 2011-06-01 MED ORDER — ASPIRIN 81 MG PO TABS: 81.0000 mg | ORAL_TABLET | Freq: Every day | ORAL | Status: DC

## 2011-06-01 MED ORDER — HEPARIN (PORCINE) IN NACL 2-0.9 UNIT/ML-% IJ SOLN
INTRAMUSCULAR | Status: AC
  Filled 2011-06-01: qty 2000

## 2011-06-01 MED ORDER — ZOLPIDEM TARTRATE 5 MG PO TABS
5.0000 mg | ORAL_TABLET | Freq: Every evening | ORAL | Status: DC | PRN
  Administered 2011-06-01: 5 mg via ORAL
  Filled 2011-06-01: qty 1

## 2011-06-01 MED ORDER — SODIUM CHLORIDE 0.9 % IV SOLN
INTRAVENOUS | Status: DC
  Administered 2011-06-01: 08:00:00 via INTRAVENOUS

## 2011-06-01 MED ORDER — VILAZODONE HCL 10 MG PO TABS: 10.0000 mg | ORAL_TABLET | Freq: Every day | ORAL | Status: DC

## 2011-06-01 MED ORDER — HYDROCODONE-ACETAMINOPHEN 5-325 MG PO TABS
1.0000 | ORAL_TABLET | ORAL | Status: DC | PRN
Start: 2011-06-01 — End: 2011-06-02
  Administered 2011-06-01: 1 via ORAL
  Filled 2011-06-01: qty 1

## 2011-06-01 MED ORDER — SODIUM CHLORIDE 0.9 % IJ SOLN
3.0000 mL | Freq: Two times a day (BID) | INTRAMUSCULAR | Status: DC
  Administered 2011-06-01: 3 mL via INTRAVENOUS

## 2011-06-01 MED ORDER — METOPROLOL TARTRATE 12.5 MG HALF TABLET
12.5000 mg | ORAL_TABLET | Freq: Two times a day (BID) | ORAL | Status: DC
  Administered 2011-06-01 – 2011-06-02 (×3): 12.5 mg via ORAL
  Filled 2011-06-01 (×4): qty 1

## 2011-06-01 MED ORDER — NITROGLYCERIN 0.4 MG SL SUBL: 0.4000 mg | SUBLINGUAL_TABLET | SUBLINGUAL | Status: DC | PRN

## 2011-06-01 MED ORDER — DIAZEPAM 5 MG PO TABS
ORAL_TABLET | ORAL | Status: AC
  Filled 2011-06-01: qty 2

## 2011-06-01 NOTE — Progress Notes (Signed)
TR BAND REMOVAL  LOCATION:  right radial  DEFLATED PER PROTOCOL:  yes  TIME BAND OFF / DRESSING APPLIED:   1600   SITE UPON ARRIVAL:   Level 0  SITE AFTER BAND REMOVAL:  Level 0  REVERSE ALLEN'S TEST:    positive  CIRCULATION SENSATION AND MOVEMENT:  Within Normal Limits  yes  COMMENTS:    

## 2011-06-01 NOTE — Interval H&P Note (Signed)
History and Physical Interval Note:  06/01/2011 8:54 AM  Tyler Mayo  has presented today for surgery, with the diagnosis of blockage  The various methods of treatment have been discussed with the patient and family. After consideration of risks, benefits and other options for treatment, the patient has consented to  Procedure(s) (LRB): PERCUTANEOUS CORONARY STENT INTERVENTION (PCI-S) (N/A) as a surgical intervention .  The patients' history has been reviewed, patient examined, no change in status, stable for surgery.  I have reviewed the patients' chart and labs.  Questions were answered to the patient's satisfaction.    The patient is very anxious this am. I reviewed the procedural plans again in detail with him and his wife. Plan for FFR of the LAD and probably PCI based on FFR result. He understands and agrees to proceed. Will premedicate with 10 mg oral valium.  Tonny Bollman

## 2011-06-01 NOTE — Op Note (Signed)
   CARDIAC CATH NOTE  Name: Tyler Mayo MRN: [id number] DOB: 1940/10/16  Procedure: Pressure wire analysis of the LAD,  PTCA and stenting of the mid LAD  Indication: 71 year old gentleman with CAD who presented last month with an inferior MI. He was noted to have severe stenosis in his mid LAD and was brought in for staged intervention.  Procedural Details: The right wrist was prepped, draped, and anesthetized with 1% lidocaine. Using the modified Seldinger technique, a 6 Fr sheath was introduced into the radial artery. 3 mg verapamil was administered through the radial sheath. Weight-based bivalirudin was given for anticoagulation. Once a therapeutic ACT was achieved, a 6 [place] XB LAD 3.5 cm guide catheter was inserted.  A pressure wire was advanced across the lesion after it had been normalized at the end of the guide catheter. The baseline FFR was 0.89. Intracoronary adenosine was started in the FFR quickly dropped to 0.72. This was highly significant and I elected to stent the severe stenosis in the mid LAD. There was eccentric 80-90% stenosis present. The lesion was predilated with a 2.5 x 12 mm balloon.  The lesion was then stented with a 2.75 x 20 mm Promus element drug-eluting stent.  The stent was postdilated with a 3.0 x 12 mm noncompliant balloon.  Following PCI, there was 0% residual stenosis and TIMI-3 flow.  There was residual downstream stenosis noted. There was mild vasospasm that was relieved with intracoronary nitroglycerin. Final angiography confirmed an excellent result. The patient tolerated the procedure well. There were no immediate procedural complications. A TR band was used for radial hemostasis. The patient was transferred to the post catheterization recovery area for further monitoring.  Lesion Data: Vessel: Mid LAD Percent stenosis (pre): 90 TIMI-flow (pre):  3 Stent:  2.75 x 20 mm Promus element Percent stenosis (post): 0 TIMI-flow (post): 3  Conclusions:  Successful PCI of the mid LAD dilated by FFR  Recommendations: Continue aggressive risk reduction and dual antiplatelet therapy for a minimum of 12 months.  Tonny Bollman 06/01/2011, 10:51 AM

## 2011-06-02 ENCOUNTER — Other Ambulatory Visit: Payer: Self-pay

## 2011-06-02 ENCOUNTER — Encounter (HOSPITAL_COMMUNITY): Payer: Self-pay | Admitting: Physician Assistant

## 2011-06-02 DIAGNOSIS — I2510 Atherosclerotic heart disease of native coronary artery without angina pectoris: Secondary | ICD-10-CM

## 2011-06-02 LAB — CBC
HCT: 41.4 % (ref 39.0–52.0)
Platelets: 206 10*3/uL (ref 150–400)
RDW: 13.6 % (ref 11.5–15.5)
WBC: 8.8 10*3/uL (ref 4.0–10.5)

## 2011-06-02 LAB — BASIC METABOLIC PANEL
Chloride: 106 mEq/L (ref 96–112)
GFR calc Af Amer: 86 mL/min — ABNORMAL LOW (ref >90)
Potassium: 4 mEq/L (ref 3.5–5.1)
Sodium: 140 mEq/L (ref 135–145)

## 2011-06-02 NOTE — Progress Notes (Signed)
CARDIAC REHAB PHASE I   PRE:  Rate/Rhythm: 72 SR  BP:  Supine:   Sitting:   Standing: 110/68   SaO2: 95 RA  MODE:  Ambulation: 500 ft   POST:  Rate/Rhythem: 79 SR  BP:  Supine:   Sitting: 113/82  Standing:   SaO2: 97 RA  Tyler Mayo [employer] received and appreciated. Pt ambulated 500 ft assist x 1. Tolerated well, complained of mild back pain as related to spinal fusion. No other symptoms reported. Returned to bedside. VSS. Discharge education completed. Covered PCI, stent book/card, Effient, diet, home exercise, NTG use, and phase II cardiac rehab. Pt voiced understanding and expressed interest in phase II at [place]. All questions answered. Will send referral and follow up. Electronically signed by Harriett Sine MS on Saturday June 02 2011 at 0742 EST

## 2011-06-02 NOTE — Discharge Summary (Signed)
Discharge Summary   Patient ID: Tyler Mayo MRN: [id number], DOB/AGE: 69-20-1942 69 y.o. Admit date: 06/01/2011 D/C date:     06/02/2011   Primary Discharge Diagnoses:  1. CAD - s/p planned PCI/DES to LAD 06/01/11 - s/p Inferior STEMI 05/12/2011 s/p RCA/DES to RCA - EF 55% at time of 05/12/11 cath  Secondary Discharge Diagnoses:  1. Depression/anxiety - followed by psychiatry 2. Erectile dysfunction 3. Hyperlipidemia 4. History of tobacco abuse 5. Lung nodules s/p bx with Dr. Shari Heritage 12/2010  Hospital Course: 71 y/o M with hx of HL, CAD was recently admitted 2/2-2/4 with an inferior STEMI. Emergent LHC 05/12/11: LAD 80% proximal at the bifurcation of a large first septal perforator, RCA occluded mid, EF 55% with infero-basal mild to moderate hypokinesis. PCI: Atherectomy and a resolute DES to the RCA. Post PCI course was uneventful. The plan was for staged intervention of the LAD. Since discharge the patient has been doing well but has been quite anxious about his diagnosis. The patient denied chest pain, shortness of breath, syncope, orthopnea. He was brought in 06/01/11 for planned PCI and underwent pressure wire analysis of the LAD with subsequent PTCA and stenting of the mid LAD with a DES. The patient tolerated the procedure well without complaint and ambulated this morning without difficulty. The patient was seen and examined today and felt stable for discharge by Dr. Elease Hashimoto.  Discharge Vitals: Blood pressure 113/82, pulse 73, temperature 97.7 F (36.5 C), temperature source Oral, resp. rate 20, height 5\' 10"  (1.778 m), weight 219 lb 12.8 oz (99.7 kg), SpO2 95.00%.  Labs: Lab Results  Component Value Date   WBC 8.8 06/02/2011   HGB 14.0 06/02/2011   HCT 41.4 06/02/2011   MCV 94.1 06/02/2011   PLT 206 06/02/2011    Lab 06/02/11 0430  NA 140  K 4.0  CL 106  CO2 27  BUN 10  CREATININE 1.01  CALCIUM 9.6  PROT --  BILITOT --  ALKPHOS --  ALT --  AST --  GLUCOSE 89    Lab  Results  Component Value Date   CHOL 190 05/13/2011   HDL 40 05/13/2011   LDLCALC 409* 05/13/2011   TRIG 144 05/13/2011     Diagnostic Studies/Procedures   Cardiac catheterization this admission, please see full report and above for summary.  Discharge Medications   Medication List  As of 06/02/2011  9:23 AM   TAKE these medications         aspirin 81 MG tablet   Take 81 mg by mouth daily.      atorvastatin 80 MG tablet   Commonly known as: LIPITOR   Take 80 mg by mouth daily.      clonazePAM 0.5 MG tablet   Commonly known as: KLONOPIN   Take 0.5 mg by mouth 2 (two) times daily as needed. For anxiety      HYDROcodone-acetaminophen 5-325 MG per tablet   Commonly known as: NORCO   Take 1 tablet by mouth every 4 (four) hours as needed. For pain      metoprolol tartrate 25 MG tablet   Commonly known as: LOPRESSOR   Take 12.5 mg by mouth 2 (two) times daily.      nitroGLYCERIN 0.4 MG SL tablet   Commonly known as: NITROSTAT   Place 0.4 mg under the tongue every 5 (five) minutes as needed. For chest pain      NUEDEXTA 20-10 MG Caps   Generic drug: Dextromethorphan-Quinidine   Take  2 tablets by mouth Daily.      prasugrel 10 MG Tabs   Commonly known as: EFFIENT   Take 10 mg by mouth daily.      pregabalin 25 MG capsule   Commonly known as: LYRICA   Take 25 mg by mouth 3 (three) times daily as needed. For pain      VIIBRYD 10 MG Tabs   Generic drug: Vilazodone HCl   Take 1 tablet by mouth Daily.            Disposition   The patient will be discharged in stable condition to home. Discharge Orders    Future Appointments: Provider: Department: Dept Phone: Center:   08/07/2011 10:45 AM [facility] [phone] [email or web address]     Future Orders Please Complete By Expires   Diet - low sodium heart healthy      Increase activity slowly      Comments:   No driving for 2 days. No lifting over 5 lbs for 1 week. No sexual activity for 1 week. Keep  procedure site clean & dry. If you notice increased pain, swelling, bleeding or pus, call/return!  You may shower, but no soaking baths/hot tubs/pools for 1 week.         The patient does not currently work. After his MI he was instructed to abstain from driving for 2 weeks which has passed, but post cath we have asked him to abstain for 2 days.  Duration of Discharge Encounter: 35 minutes including physician and PA time.  Signed, Ronie Spies PA-C 06/02/2011, 9:23 AM  Attending Note:   The patient was seen and examined.  Agree with assessment and plan as noted above.  See my note from earlier today.  Vesta Mixer, Montez Hageman., MD, [facility] 06/02/2011, 10:05 AM

## 2011-06-02 NOTE — Progress Notes (Signed)
Subjective:   Pt i is doing very well. He's not having any further episodes of pain. He has a history of an inferior wall myocardial infarction on October 2. He was found to have a severe stenosis in his mid LAD. He returned yesterday for staged intervention of his mid LAD.  He had stenting of his mid LAD using a 2.75 x 20 mm Promus elements stent. It was post dilated using a 3.0 noncompliant balloon. He's done well and has not had any further episodes of chest pain. He has been up ambulating without difficulty.      Marland Kitchen atorvastatin  80 mg Oral Daily  . bivalirudin      . Dextromethorphan-Quinidine  2 tablet Oral Daily  . diazepam      . diazepam  10 mg Oral Once  . fentaNYL      . heparin      . lidocaine      . metoprolol tartrate  12.5 mg Oral BID  . midazolam      . midazolam      . nitroGLYCERIN      . prasugrel  10 mg Oral To 282  . prasugrel  10 mg Oral Daily  . sodium chloride  3 mL Intravenous Q12H  . Vilazodone HCl  10 mg Oral Daily  . DISCONTD: adenosine  16 mL Intravenous Once  . DISCONTD: aspirin  81 mg Oral Daily  . DISCONTD: sodium chloride  3 mL Intravenous Q12H      . sodium chloride 75 mL/hr at 06/01/11 1115  . DISCONTD: sodium chloride 75 mL/hr at 06/01/11 0758    Objective:  Vital Signs in the last 24 hours: Blood pressure 113/82, pulse 73, temperature 97.7 F (36.5 C), temperature source Oral, resp. rate 20, height 5\' 10"  (1.778 m), weight 219 lb 12.8 oz (99.7 kg), SpO2 95.00%. Temp:  [97.5 F (36.4 C)-98.4 F (36.9 C)] 97.7 F (36.5 C) (02/23 0724) Pulse Rate:  [59-73] 73  (02/23 0724) Resp:  [14-20] 20  (02/23 0724) BP: (87-123)/(49-82) 113/82 mmHg (02/23 0724) SpO2:  [95 %-98 %] 95 % (02/23 0724) Weight:  [219 lb 12.8 oz (99.7 kg)] 219 lb 12.8 oz (99.7 kg) (02/23 0724)  Intake/Output from previous day: 02/22 0701 - 02/23 0700 In: 1481.3 [P.O.:900; I.V.:581.3] Out: -  Intake/Output from this shift:    Physical Exam:  Physical  Exam: Blood pressure 113/82, pulse 73, temperature 97.7 F (36.5 C), temperature source Oral, resp. rate 20, height 5\' 10"  (1.778 m), weight 219 lb 12.8 oz (99.7 kg), SpO2 95.00%. General: Well developed, well nourished, in no acute distress. Head: Normocephalic, atraumatic, sclera non-icteric, mucus membranes are moist,  Neck: Supple. Negative for carotid bruits. JVD not elevated. Lungs: Clear bilaterally to auscultation without wheezes, rales, or rhonchi. Breathing is unlabored. Heart: RRR with S1 S2. No murmurs, rubs, or gallops  Abdomen: Soft, non-tender, non-distended with normoactive bowel sounds. No hepatomegaly. No rebound/guarding. No obvious abdominal masses. Msk:  Strength and tone appear normal for age. Extremities: His right radial cath site has oozed a little bit. His distal pulses are normal. Neuro: Alert and oriented X 3. Moves all extremities spontaneously. Psych:  Responds to questions appropriately with a normal affect.    Lab Results:   [facility] 06/02/11 0430  NA 140  K 4.0  CL 106  CO2 27  GLUCOSE 89  BUN 10  CREATININE 1.01  CALCIUM 9.6  MG --  PHOS --   No results found  for this basename: AST:2,ALT:2,ALKPHOS:2,BILITOT:2,PROT:2,ALBUMIN:2 in the last 72 hours No results found for this basename: LIPASE:2,AMYLASE:2 in the last 72 hours  Basename 06/02/11 0430  WBC 8.8  NEUTROABS --  HGB 14.0  HCT 41.4  MCV 94.1  PLT 206    Tele:  NSR  Assessment/Plan:   1. Coronary artery disease: He status post PTCA and stenting of his left anterior descending artery. He's not having any angina. We'll continue with the same medications.  He has been up ambulating without any significant problems. He'll followup with Dr. Excell Seltzer in the office.   Disposition: Discharged home later today.  Vesta Mixer, Montez Hageman., MD, [facility] 06/02/2011, 8:40 AM LOS: Day 1

## 2011-06-04 MED FILL — Dextrose Inj 5%: INTRAVENOUS | Qty: 50 | Status: AC

## 2011-06-13 ENCOUNTER — Other Ambulatory Visit: Payer: Self-pay | Admitting: Thoracic Surgery

## 2011-06-13 DIAGNOSIS — D381 Neoplasm of uncertain behavior of trachea, bronchus and lung: Secondary | ICD-10-CM

## 2011-06-18 ENCOUNTER — Ambulatory Visit (INDEPENDENT_AMBULATORY_CARE_PROVIDER_SITE_OTHER): Payer: Medicare Other | Admitting: Physician Assistant

## 2011-06-18 ENCOUNTER — Encounter: Payer: Self-pay | Admitting: Physician Assistant

## 2011-06-18 ENCOUNTER — Encounter: Payer: Medicare Other | Admitting: Physician Assistant

## 2011-06-18 DIAGNOSIS — I2510 Atherosclerotic heart disease of native coronary artery without angina pectoris: Secondary | ICD-10-CM

## 2011-06-18 DIAGNOSIS — E785 Hyperlipidemia, unspecified: Secondary | ICD-10-CM

## 2011-06-18 NOTE — Progress Notes (Signed)
[address] [place], [place]  [address] Phone: [phone] Fax:  [phone]  Date:  06/18/2011   Name:  Tyler Mayo       DOB:  1941/03/07 MRN:  [id number]  PCP:  Dr. Tawanna Cooler Primary Cardiologist:  Dr. Charlton Haws  Primary Electrophysiologist:  None    History of Present Illness: Tyler Mayo is a 71 y.o. male who presents for post hospital follow up.  He has a history of chronic back pain, lung nodules, status post biopsy 9/12 with benign findings, TIAs.  Myoview 8/12: Negative for ischemia, EF 74%.  Admitted 05/2011 with an inferior STEMI.  Emergent LHC 05/12/11: LAD 80% proximal at the bifurcation of a large first septal perforator, RCA occluded mid, EF 55% with infero-basal mild to moderate hypokinesis.  PCI: Atherectomy and a resolute DES to the RCA.  He was to be set up for staged intervention of the LAD.   I saw him with Dr. Tonny Bollman on 2/18 in the office and we arranged repeat heart cath.  This was done 06/01/11:  Promus DES to the mLAD using FFR analysis.  He was d/c to home the next day.  Labs: Hgb 14, K+ 4, creatinine 1.01.    Doing well.  The patient denies chest pain, shortness of breath, syncope, orthopnea, PND or significant pedal edema.   Past Medical History  Diagnosis Date  . Lung nodules     s/p bx with Dr. [name] 9/12  . Depression with anxiety   . Erectile dysfunction   . CAD (coronary artery disease)     a.  8.2012 neg MV;  b.  05/12/2011 Inf STEMI - 100% RCA - 3.5x22 Resolute DES, Residual 80% LAD dzs. c. s/p planned DES to LAD 06/01/11.  Marland Kitchen Hyperlipidemia   . History of tobacco abuse     Current Outpatient Prescriptions  Medication Sig Dispense Refill  . aspirin 81 MG tablet Take 81 mg by mouth daily.      Marland Kitchen atorvastatin (LIPITOR) 80 MG tablet Take 80 mg by mouth daily.      . clonazePAM (KLONOPIN) 0.5 MG tablet Take 0.5 mg by mouth 2 (two) times daily as needed. For anxiety      . HYDROcodone-acetaminophen (NORCO)  5-325 MG per tablet Take 1 tablet by mouth every 4 (four) hours as needed. For pain      . metoprolol tartrate (LOPRESSOR) 25 MG tablet Take 12.5 mg by mouth 2 (two) times daily.      . nitroGLYCERIN (NITROSTAT) 0.4 MG SL tablet Place 0.4 mg under the tongue every 5 (five) minutes as needed. For chest pain      . NUEDEXTA 20-10 MG CAPS Take 2 tablets by mouth Daily.      . prasugrel (EFFIENT) 10 MG TABS Take 10 mg by mouth daily.      . pregabalin (LYRICA) 25 MG capsule Take 25 mg by mouth 3 (three) times daily as needed. For pain      . VIIBRYD 10 MG TABS Take 1 tablet by mouth Daily.        Allergies: No Known Allergies  History  Substance Use Topics  . Smoking status: Former Smoker    Types: Cigars    Quit date: 05/12/2011  . Smokeless tobacco: Never Used   Comment: a couple per week  . Alcohol Use: No     ETOH free x 15+ yrs      PHYSICAL EXAM: VS:  BP 118/68  Pulse 69  Ht 5\' 10"  (1.778 m)  Wt 223 lb (101.152 kg)  BMI 32.00 kg/m2 Well nourished, well developed, in no acute distress HEENT: normal Neck: no JVD Cardiac:  normal S1, S2; RRR; no murmur Lungs:  clear to auscultation bilaterally, no wheezing, rhonchi or rales Abd: soft, nontender, no hepatomegaly Ext: no edema, right wristwithout hematoma or bruit  Skin: warm and dry Neuro:  CNs 2-12 intact, no focal abnormalities noted  EKG:  NSR, HR 69, inf infarct, iRBBB  ASSESSMENT AND PLAN:   1. CAD (coronary artery disease)  Doing well post PCI.  We discussed the importance of dual antiplatelet therapy.  Refer to cardiac rehab.  Follow up with Dr. Charlton Haws in 2 mos.   2. Hyperlipidemia  Continue Lipitor.      Signed, Tereso Newcomer, PA-C  2:13 PM 06/18/2011

## 2011-06-18 NOTE — Patient Instructions (Signed)
Your physician recommends that you schedule a follow-up appointment in: 2 weeks with Dr Haywood Filler have been referred to Cardiac Rehab

## 2011-06-27 ENCOUNTER — Telehealth: Payer: Self-pay | Admitting: Cardiovascular Disease

## 2011-06-27 NOTE — Telephone Encounter (Signed)
PT  AWARE   WILL FORWARD TO DR NISHAN FOR REVIEW ./CY 

## 2011-06-27 NOTE — Telephone Encounter (Signed)
New msg Pt takes a med. nudexta. He wants to know should he continue to take this med with his heart condition

## 2011-06-27 NOTE — Telephone Encounter (Signed)
Should not take this med with his heart condition.  ( quinidine is bad for him)

## 2011-06-28 NOTE — Telephone Encounter (Signed)
PT AWARE  TO STOP NUDEXTA./CY

## 2011-07-05 ENCOUNTER — Encounter (HOSPITAL_COMMUNITY)
Admission: RE | Admit: 2011-07-05 | Discharge: 2011-07-05 | Disposition: A | Discharge status: Home / Self Care | Payer: Medicare Other | Source: Ambulatory Visit | Attending: Cardiovascular Disease | Admitting: Cardiovascular Disease

## 2011-07-05 NOTE — Progress Notes (Signed)
Cardiac Rehab Medication Review by a Pharmacist  Does the patient  feel that his/her medications are working for him/her?  yes  Has the patient been experiencing any side effects to the medications prescribed?  no  Does the patient measure his/her own blood pressure or blood glucose at home?  yes   Does the patient have any problems obtaining medications due to transportation or finances?   no  Understanding of regimen: good Understanding of indications: good Potential of compliance: good    Pharmacist comments:     Ival Bible 07/05/2011 8:40 AM

## 2011-07-09 ENCOUNTER — Encounter (HOSPITAL_COMMUNITY): Admission: RE | Admit: 2011-07-09 | Payer: Medicare Other | Source: Ambulatory Visit

## 2011-07-09 ENCOUNTER — Telehealth: Payer: Self-pay | Admitting: Cardiovascular Disease

## 2011-07-09 NOTE — Telephone Encounter (Signed)
PT HAS STOPPED LIPITOR AND  PAIN HAS WENT AWAY.PT AWARE WILL FORWARD TO DR Eden Emms FOR REVIEW .Zack Seal

## 2011-07-09 NOTE — Telephone Encounter (Signed)
Please return call to patient [phone]  Patient is taking atorvastatin (LIPITOR) 80 MG tablet, and suspects he is having side affects from the medication with muscle and joint pain a couple days ago.  Patient given medication after stint placed.  Please return call to patient [phone].

## 2011-07-11 ENCOUNTER — Encounter (HOSPITAL_COMMUNITY): Payer: Medicare Other

## 2011-07-12 NOTE — Progress Notes (Signed)
Page Spiro 71 y.o. male       Nutrition Screen                                                                    YES  NO Do you live in a nursing home?  X   Do you eat out more than 3 times/week?   X  If yes, how many times per week do you eat out? 5  Do you have food allergies?   X If yes, what are you allergic to?  Have you gained or lost more than 10 lbs without trying?              X  If yes, how much weight have you lost and over what time period? 30 lbs gained or lost over 52 weeks  Do you want to lose weight?    X  If yes, what is a goal weight or amount of weight you would like to lose? 190 lb  Do you eat alone most of the time?   X   Do you eat less than 2 meals/day?  X If yes, how many meals do you eat?  Do you drink more than 3 alcohol drinks/day?  X If yes, how many drinks per day? Pt does not drink.  Are you having trouble with constipation? *  X If yes, what are you doing to help relieve constipation?  Do you have financial difficulties with buying food?*    X   Are you experiencing regular nausea/ vomiting?*     X   Do you have a poor appetite? *                                        X   Do you have trouble chewing/swallowing? *   X    Pt with diagnoses of:  x Stent/ PTCA x Dyslipidemia  / HDL< 40 / LDL>70 / High TG      x %  Body fat >goal / Body Mass Index >25 x MI       Pt Risk Score   3       Diagnosis Risk Score  20       Total Risk Score   23                         High Risk               x Low Risk    HT: 70" Ht Readings from Last 1 Encounters:  07/05/11 5\' 10"  (1.778 m)    WT:   223.3 lb (101.5 kg) Wt Readings from Last 3 Encounters:  07/05/11 223 lb 12.3 oz (101.5 kg)  06/18/11 223 lb (101.152 kg)  06/02/11 219 lb 12.8 oz (99.7 kg)     IBW 96.4 105%IBW BMI 32.1 31.2%body fat  Meds reviewed Past Medical History  Diagnosis Date  . Lung nodules     s/p bx with Dr. [name] 9/12  . Depression with anxiety   . Erectile dysfunction   . CAD  (coronary artery disease)  a.  8.2012 neg MV;  b.  05/12/2011 Inf STEMI - 100% RCA - 3.5x22 Resolute DES, Residual 80% LAD dzs. c. s/p planned DES to LAD 06/01/11.  Marland Kitchen Hyperlipidemia   . History of tobacco abuse   Activity level: Pt is active    Wt goal: 199-211 lb ( 90.5-95.9 kg) Current tobacco use? No    Pt quit tobacco use on 05/12/11 If pt using tobacco, is pt taking a vit C? No  Food/Drug Interaction? No Labs:  Lipid Panel     Component Value Date/Time   CHOL 190 05/13/2011 0515   TRIG 144 05/13/2011 0515   HDL 40 05/13/2011 0515   CHOLHDL 4.8 05/13/2011 0515   VLDL 29 05/13/2011 0515   LDLCALC 121* 05/13/2011 0515   Lab Results  Component Value Date   HGBA1C 5.6 05/13/2011  09/11/10 Glucose 107  LDL goal: < 100      MI and > 2:      > 71 yo male Estimated Daily Nutrition Needs for: ? wt loss  1700-2200 Kcal , Total Fat 45-60gm, Saturated Fat 12-17 gm, Trans Fat 1.6-2.2 gm,  Sodium less than 1500 mg

## 2011-07-13 ENCOUNTER — Other Ambulatory Visit (HOSPITAL_COMMUNITY): Payer: Self-pay | Admitting: Nurse Practitioner

## 2011-07-13 ENCOUNTER — Encounter (HOSPITAL_COMMUNITY): Payer: Medicare Other

## 2011-07-13 NOTE — Telephone Encounter (Signed)
Spoke with pt, he has never taken any other statin. He wants to try something different than lipitor. He also wants to make sure the order for cardiac rehab is sent to the hosp before 1pm Monday so he can get started. Pt aware dr Eden Emms has not been here and we will be in touch with him the first of the week. Will forward for dr Eden Emms review

## 2011-07-15 NOTE — Telephone Encounter (Signed)
Try crestor 10 mg sig 1 daily at night.  Number 30

## 2011-07-16 ENCOUNTER — Telehealth: Payer: Self-pay | Admitting: Cardiovascular Disease

## 2011-07-16 ENCOUNTER — Encounter (HOSPITAL_COMMUNITY): Payer: Medicare Other

## 2011-07-16 MED ORDER — ROSUVASTATIN CALCIUM 10 MG PO TABS: 10.0000 mg | ORAL_TABLET | Freq: Every day | ORAL | Status: DC

## 2011-07-16 NOTE — Telephone Encounter (Signed)
Tyler Mayo  REHAB FORM FAXED   AS REQUESTED  DID GET CONFIRMATION ON RECEIPT ./CY

## 2011-07-16 NOTE — Telephone Encounter (Signed)
New problem:   Per Randa Evens calling from cardiac rehab. Status of order to be completed for cardiac rehab.

## 2011-07-16 NOTE — Telephone Encounter (Signed)
Spoke with pt, aware of med change. Paperwork will be completed today and faxed into rehab.

## 2011-07-18 ENCOUNTER — Encounter (HOSPITAL_COMMUNITY): Payer: Self-pay

## 2011-07-18 ENCOUNTER — Encounter (HOSPITAL_COMMUNITY)
Admission: RE | Admit: 2011-07-18 | Discharge: 2011-07-18 | Disposition: A | Discharge status: Home / Self Care | Payer: Medicare Other | Source: Ambulatory Visit | Attending: Cardiovascular Disease | Admitting: Cardiovascular Disease

## 2011-07-18 DIAGNOSIS — I2510 Atherosclerotic heart disease of native coronary artery without angina pectoris: Secondary | ICD-10-CM | POA: Insufficient documentation

## 2011-07-18 DIAGNOSIS — F17200 Nicotine dependence, unspecified, uncomplicated: Secondary | ICD-10-CM | POA: Insufficient documentation

## 2011-07-18 DIAGNOSIS — Z9861 Coronary angioplasty status: Secondary | ICD-10-CM | POA: Insufficient documentation

## 2011-07-18 DIAGNOSIS — I252 Old myocardial infarction: Secondary | ICD-10-CM | POA: Insufficient documentation

## 2011-07-18 DIAGNOSIS — Z5189 Encounter for other specified aftercare: Secondary | ICD-10-CM | POA: Insufficient documentation

## 2011-07-18 DIAGNOSIS — E785 Hyperlipidemia, unspecified: Secondary | ICD-10-CM | POA: Insufficient documentation

## 2011-07-18 NOTE — Progress Notes (Signed)
Pt started cardiac rehab today.  Pt tolerated light exercise without difficulty.  VSS, Telemetry-NSR.  Pt oriented to exercise equipment and routine.  Understanding verbalized. Will continue to monitor the patient throughout  the program.

## 2011-07-18 NOTE — Progress Notes (Signed)
Spoke with pt. Pt interested in losing wt. Wt loss tips reviewed. Pt reports he used to be able to lose wt easily due to running. Pt unable to run due to back pain. Pt encouraged to watch caloric intake and exercise on non-rehab days according to recommendations given to pt by Harriett Sine, EP. Pt expressed understanding of the above. Continue client-centered nutrition education by RD as part of interdisciplinary care.  Monitor and evaluate progress toward nutrition goal with team.

## 2011-07-19 ENCOUNTER — Telehealth: Payer: Self-pay | Admitting: Cardiovascular Disease

## 2011-07-19 NOTE — Telephone Encounter (Signed)
07/19/11--spoke with pt who is concerned about soreness in groin 1 week after cath--pt states he went to c rehab for 1st time yesterday and although it was started i week late he is concerned that it is sore now--advised if any reddness,hot to touch, or drainage he should come in for check--he states he has none of these symptoms --advised it is probably sore due to fact he has stated using those muscles and since he is going to rehab tomorrow have the nurse look at it there--he also had ? Concerning the reason they switched him to crestor after initially starting him on lipitor--advised maybe due to fact he was c/o muscle and joint pain--advised to try crestor for a couple of weeks and see if muscle pain/joint pain decreases--pt agrees--nt

## 2011-07-19 NOTE — Telephone Encounter (Signed)
New msg Pt's wife called and said he is having some soreness in groin area where went in to place stent. Should he be concerned?

## 2011-07-20 ENCOUNTER — Encounter (HOSPITAL_COMMUNITY)
Admission: RE | Admit: 2011-07-20 | Discharge: 2011-07-20 | Disposition: A | Discharge status: Home / Self Care | Payer: Medicare Other | Source: Ambulatory Visit | Attending: Cardiovascular Disease | Admitting: Cardiovascular Disease

## 2011-07-20 NOTE — Progress Notes (Signed)
Nutrition Note  Spoke with pt and pt's wife. Pt and pt's wife having some difficulty making dietary changes. Per discussion, pt's wife wanted to make changes "to our diet overnight." Pt continues to eat his nutter butter cookies and drink diet coke. This Clinical research associate made suggestions to take dietary changes in a step-by-step manner. Pt and pt wife expressed understanding. Continue client-centered nutrition education by RD as part of interdisciplinary care.  Monitor and evaluate progress toward nutrition goal with team.

## 2011-07-23 ENCOUNTER — Encounter (HOSPITAL_COMMUNITY): Payer: Medicare Other

## 2011-07-23 NOTE — Telephone Encounter (Signed)
SPOKE WITH PT  GROIN  HAS IMPROVED  .Tyler Mayo

## 2011-07-25 ENCOUNTER — Ambulatory Visit
Admission: RE | Admit: 2011-07-25 | Discharge: 2011-07-25 | Disposition: A | Discharge status: Home / Self Care | Payer: Medicare Other | Source: Ambulatory Visit | Attending: Thoracic Surgery | Admitting: Thoracic Surgery

## 2011-07-25 ENCOUNTER — Encounter: Payer: Self-pay | Admitting: Thoracic Surgery

## 2011-07-25 ENCOUNTER — Encounter (HOSPITAL_COMMUNITY)
Admission: RE | Admit: 2011-07-25 | Discharge: 2011-07-25 | Disposition: A | Discharge status: Home / Self Care | Payer: Medicare Other | Source: Ambulatory Visit | Attending: Cardiovascular Disease | Admitting: Cardiovascular Disease

## 2011-07-25 ENCOUNTER — Encounter (HOSPITAL_COMMUNITY): Payer: Self-pay

## 2011-07-25 ENCOUNTER — Ambulatory Visit (INDEPENDENT_AMBULATORY_CARE_PROVIDER_SITE_OTHER): Payer: Medicare Other | Admitting: Thoracic Surgery

## 2011-07-25 VITALS — BP 120/78 | HR 104 | Resp 18 | Ht 70.0 in | Wt 218.0 lb

## 2011-07-25 DIAGNOSIS — D381 Neoplasm of uncertain behavior of trachea, bronchus and lung: Secondary | ICD-10-CM

## 2011-07-25 DIAGNOSIS — R918 Other nonspecific abnormal finding of lung field: Secondary | ICD-10-CM

## 2011-07-25 NOTE — Progress Notes (Signed)
HPI patient returns for followup today. Tyler Mayo had a myocardial infarction in February requiring 2 stents and is presently on effient. Tyler Mayo had a followup CT scan today. It appears that all the lesions 1 in the right middle lobe left upper lobe and superior segment of right lower lobe infiltrates as well as increased in size. A just been a slight increase of 1-2 mm. Biopsies of the right middle lobe lesion showed a chronic infiltrate. 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 There is some cavitation in the right middle lobe lesion. I'm not sure what is going on but in am concerned that this may be some type of chronic infiltrate in the lung. We still haven't completely rule out cancer. I have recommended that Tyler Mayo have excisional biopsies of the 2 right lung masses but since Tyler Mayo's had a recent myocardial infarction and is on effient, I would like to wait before considering this I will refer him to Dr. Marcelyn Bruins  for his evaluation and will discuss them and cancer conference. I will give him a call after the cancer conference.  Current Outpatient Prescriptions  Medication Sig Dispense Refill  . aspirin 81 MG tablet Take 81 mg by mouth daily.      . clonazePAM (KLONOPIN) 0.5 MG tablet Take 0.5 mg by mouth 2 (two) times daily as needed. For anxiety      . gabapentin (NEURONTIN) 100 MG capsule Take 100 mg by mouth 4 (four) times daily. Dose is being titrated up by Dr. Prince Rome.  Patient reports currently taking 4 capsules each day (06/2011)      . HYDROcodone-acetaminophen (NORCO) 5-325 MG per tablet Take 1 tablet by mouth every 4 (four) hours as needed. For pain      . metoprolol tartrate (LOPRESSOR) 25 MG tablet Take 12.5 mg by mouth 2 (two) times daily.      . nitroGLYCERIN (NITROSTAT) 0.4 MG SL tablet Place 0.4 mg under the tongue every 5 (five) minutes as needed. For chest pain      . prasugrel (EFFIENT) 10 MG TABS Take 10 mg by mouth daily.      . rosuvastatin  (CRESTOR) 10 MG tablet Take 1 tablet (10 mg total) by mouth at bedtime.  30 tablet  11  . VIIBRYD 10 MG TABS Take 1 tablet by mouth Daily.         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

## 2011-07-25 NOTE — Progress Notes (Signed)
Quality of Life assessment reviewed with pt.  Low scores identified.  Pt reports he has had great difficulty coping with his recent health changes.  He is troubled   with the addition of lung abnormality and the uncertainty associated with diagnosis, he is fearful for the results.  He is concerned about need for additional medical therapy, procedures and possible hospitalizations .Pt reports he often has little interest in activities and is content with "sitting around and watching TV".  Pt reports he feels that he does not have a strong network of friends in the [place] area and this bothers him.    However, pt states he is enthusiastic about increasing his exercise ability, dietary modifications and would like to lose 20lbs.  Pt states, "I know I will feel better once I am able to move around and lose weight".  Pt states his wife is very interested in learning new cooking methods and weight loss for herself as well.  Overall , pt feels that his depression symptoms are managed.  He is established with psychiatry, has upcoming appt for which he is excited about to discuss the recent health changes.  Will continue to monitor

## 2011-07-26 ENCOUNTER — Telehealth: Payer: Self-pay | Admitting: Thoracic Surgery

## 2011-07-26 NOTE — Telephone Encounter (Signed)
We Percocet and at cancer conference today. He was an enlarging right upper lobe and left upper lobe lesion. Previous biopsies showed an inflammatory area with plasma cells and multinucleated giant cells. It is some cavitation in the right upper lobe lesion. The left upper lobe lesion is also increased in size there is a small lesion superior segment of the right lower lobe. He will be seen in Dr.Clance in the near future. He had a myocardial infarction in 2 stents placed 2 months ago. He is presently on anticoagulation with effient. I discussed the situation with him and explained that the excision of ther right lesions was the next step. But he should be on anticoagulation for a minimum of 3 months prior to surgery because of his stents. It was suggested that he should possibly have a short course of prednisone since these could be inflammatory lesions. After a long discussion, I explained the options to the patient we will see him back again after he sees Dr. Shelle Iron.

## 2011-07-27 ENCOUNTER — Encounter (HOSPITAL_COMMUNITY)
Admission: RE | Admit: 2011-07-27 | Discharge: 2011-07-27 | Disposition: A | Discharge status: Home / Self Care | Payer: Medicare Other | Source: Ambulatory Visit | Attending: Cardiovascular Disease | Admitting: Cardiovascular Disease

## 2011-07-27 NOTE — Progress Notes (Signed)
Reviewed home exercise with pt today.  Pt plans to walk, use [employer] and [employer] for home exercise. Pt counseled on avoiding using Valsalva maneuver during resistive exercise.  Reviewed THR, pulse, RPE, sign and symptoms, NTG use, and when to call 911 or MD.  Pt voiced understanding. Electronically signed by Harriett Sine MS on Friday July 27 2011 at 1432

## 2011-07-30 ENCOUNTER — Encounter (HOSPITAL_COMMUNITY): Payer: Medicare Other

## 2011-08-01 ENCOUNTER — Encounter (HOSPITAL_COMMUNITY)
Admission: RE | Admit: 2011-08-01 | Discharge: 2011-08-01 | Disposition: A | Discharge status: Home / Self Care | Payer: Medicare Other | Source: Ambulatory Visit | Attending: Cardiovascular Disease | Admitting: Cardiovascular Disease

## 2011-08-03 ENCOUNTER — Encounter (HOSPITAL_COMMUNITY)
Admission: RE | Admit: 2011-08-03 | Discharge: 2011-08-03 | Disposition: A | Discharge status: Home / Self Care | Payer: Medicare Other | Source: Ambulatory Visit | Attending: Cardiovascular Disease | Admitting: Cardiovascular Disease

## 2011-08-03 NOTE — Progress Notes (Signed)
Page Spiro 71 y.o. male Nutrition Note Spoke with pt.  Nutrition Plan and Nutrition Survey reviewed with pt. Pt is following Step 1 of the Therapeutic Lifestyle Changes diet. Pt concerned re: not losing wt. Pt encouraged to keep a food journal and on-line resources discussed. Pt does not like many veggies. Weight loss tips reviewed.   Nutrition Diagnosis   Food-and nutrition-related knowledge deficit related to lack of exposure to information as related to diagnosis of: ? CVD   Obesity related to excessive energy intake as evidenced by a BMI of 32.1  Nutrition RX/ Estimated Daily Nutrition Needs for: wt loss  1700-2200 Kcal, 45-60 gm fat, 12-17 gm sat fat, 1.6-2.2 gm trans-fat, <1500 mg sodium  Nutrition Intervention   Pt's individual nutrition plan including cholesterol goals reviewed with pt.   Benefits of adopting Therapeutic Lifestyle Changes discussed when Medficts reviewed.   Pt to attend the Portion Distortion class   Pt to attend the  ? Nutrition I class                     ? Nutrition II class   Pt to keep a food journal    Pt given handouts for: ? wt loss ? 5-day menu ideas    Continue client-centered nutrition education by RD, as part of interdisciplinary care. Goal(s)   Pt to identify and limit food sources of saturated fat, trans fat, and cholesterol   Pt to identify food quantities necessary to achieve: ? wt loss to a goal wt of 199-211 lb (90.5-95.9 kg) at graduation from cardiac rehab.  Monitor and Evaluate progress toward nutrition goal with team.

## 2011-08-06 ENCOUNTER — Encounter (HOSPITAL_COMMUNITY): Admission: RE | Admit: 2011-08-06 | Payer: Medicare Other | Source: Ambulatory Visit

## 2011-08-07 ENCOUNTER — Other Ambulatory Visit: Payer: Medicare Other

## 2011-08-08 ENCOUNTER — Encounter (HOSPITAL_COMMUNITY)
Admission: RE | Admit: 2011-08-08 | Discharge: 2011-08-08 | Disposition: A | Discharge status: Home / Self Care | Payer: Medicare Other | Source: Ambulatory Visit | Attending: Cardiovascular Disease | Admitting: Cardiovascular Disease

## 2011-08-08 DIAGNOSIS — F17200 Nicotine dependence, unspecified, uncomplicated: Secondary | ICD-10-CM | POA: Insufficient documentation

## 2011-08-08 DIAGNOSIS — I252 Old myocardial infarction: Secondary | ICD-10-CM | POA: Insufficient documentation

## 2011-08-08 DIAGNOSIS — I2510 Atherosclerotic heart disease of native coronary artery without angina pectoris: Secondary | ICD-10-CM | POA: Insufficient documentation

## 2011-08-08 DIAGNOSIS — E785 Hyperlipidemia, unspecified: Secondary | ICD-10-CM | POA: Insufficient documentation

## 2011-08-08 DIAGNOSIS — Z9861 Coronary angioplasty status: Secondary | ICD-10-CM | POA: Insufficient documentation

## 2011-08-08 DIAGNOSIS — Z5189 Encounter for other specified aftercare: Secondary | ICD-10-CM | POA: Insufficient documentation

## 2011-08-09 ENCOUNTER — Other Ambulatory Visit: Payer: Medicare Other

## 2011-08-10 ENCOUNTER — Encounter (HOSPITAL_COMMUNITY)
Admission: RE | Admit: 2011-08-10 | Discharge: 2011-08-10 | Disposition: A | Discharge status: Home / Self Care | Payer: Medicare Other | Source: Ambulatory Visit | Attending: Cardiovascular Disease | Admitting: Cardiovascular Disease

## 2011-08-13 ENCOUNTER — Encounter (HOSPITAL_COMMUNITY): Payer: Medicare Other

## 2011-08-14 ENCOUNTER — Other Ambulatory Visit: Payer: Medicare Other

## 2011-08-14 ENCOUNTER — Telehealth (HOSPITAL_COMMUNITY): Payer: Self-pay | Admitting: Cardiac Rehabilitation

## 2011-08-14 NOTE — Telephone Encounter (Signed)
Pt wife expresses concern for pt extreme fear of death.  Pt has upcoming pulmonary appt this week to discuss diagnosis and treatment plan.  This is overwhelming to pt in addition to his recent cardiac event.  Spoke to [place],  appt made with Theda Belfast, chaplain to meet with pt 08/17/11 @1 :15-1:45.  Pt wife informed of appt time and she will make pt aware.

## 2011-08-15 ENCOUNTER — Encounter (HOSPITAL_COMMUNITY)
Admission: RE | Admit: 2011-08-15 | Discharge: 2011-08-15 | Disposition: A | Discharge status: Home / Self Care | Payer: Medicare Other | Source: Ambulatory Visit | Attending: Cardiovascular Disease | Admitting: Cardiovascular Disease

## 2011-08-16 ENCOUNTER — Other Ambulatory Visit (INDEPENDENT_AMBULATORY_CARE_PROVIDER_SITE_OTHER): Payer: Medicare Other

## 2011-08-16 ENCOUNTER — Encounter: Payer: Self-pay | Admitting: Pulmonary Disease

## 2011-08-16 ENCOUNTER — Other Ambulatory Visit: Payer: Self-pay | Admitting: Pulmonary Disease

## 2011-08-16 ENCOUNTER — Ambulatory Visit (INDEPENDENT_AMBULATORY_CARE_PROVIDER_SITE_OTHER): Payer: Medicare Other | Admitting: Pulmonary Disease

## 2011-08-16 VITALS — BP 114/70 | HR 84 | Temp 97.8°F | Ht 70.0 in | Wt 222.6 lb

## 2011-08-16 DIAGNOSIS — E785 Hyperlipidemia, unspecified: Secondary | ICD-10-CM

## 2011-08-16 DIAGNOSIS — R918 Other nonspecific abnormal finding of lung field: Secondary | ICD-10-CM

## 2011-08-16 LAB — HEPATIC FUNCTION PANEL
ALT: 20 U/L (ref 0–53)
Albumin: 3.9 g/dL (ref 3.5–5.2)
Alkaline Phosphatase: 79 U/L (ref 39–117)
Bilirubin, Direct: 0.2 mg/dL (ref 0.0–0.3)
Total Protein: 8.5 g/dL — ABNORMAL HIGH (ref 6.0–8.3)

## 2011-08-16 LAB — LIPID PANEL
Cholesterol: 126 mg/dL (ref 0–200)
Triglycerides: 109 mg/dL (ref 0.0–149.0)
VLDL: 21.8 mg/dL (ref 0.0–40.0)

## 2011-08-16 LAB — SEDIMENTATION RATE: Sed Rate: 87 mm/hr — ABNORMAL HIGH (ref 0–22)

## 2011-08-16 NOTE — Progress Notes (Signed)
  Subjective:    Patient ID: Tyler Mayo, male    DOB: December 11, 1940, 71 y.o.   MRN: [id number]  HPI The patient is a very complicated 71 year old male who I been asked to see for pulmonary nodules.  He was found to on chest x-ray prior to back surgery to have a nodule of unknown origin, and subsequently underwent a CT scan of his chest.  This showed a nodule in his right middle lobe, as well as his left upper lobe.  He had no pulmonary symptoms at that time and was eating well with no weight loss.  He underwent navigational bronchoscopy in June of last year with no specific diagnosis on the left and mild chronic inflammation from the nodule on the right.  He subsequently underwent transthoracic needle aspiration of his right middle lobe nodule, with no specific diagnosis being found.  This has been followed since that time, and the most recent CT chest shows slight enlargement of the nodules in question, and the right middle lobe nodule now appears to be somewhat cavitated.  The patient continues to be asymptomatic from a pulmonary standpoint, and denies any constitutional symptoms.  He has no specific rashes, hematuria, or epistaxis.  He has never been diagnosed with an autoimmune disease.  He did grow up in [place], but never lived in the [place] or surrounding areas.  He has never lived in the [address].  He has no history of TB exposure, but has never had a PPD placed.   Review of Systems  Constitutional: Positive for unexpected weight change. Negative for fever.  HENT: Negative.  Negative for ear pain, nosebleeds, congestion, sore throat, rhinorrhea, sneezing, trouble swallowing, dental problem, postnasal drip and sinus pressure.   Eyes: Negative.  Negative for redness and itching.  Respiratory: Positive for shortness of breath. Negative for cough, chest tightness and wheezing.   Cardiovascular: Negative.  Negative for palpitations and leg swelling.  Gastrointestinal: Negative.   Negative for nausea and vomiting.  Genitourinary: Negative.  Negative for dysuria.  Musculoskeletal: Positive for arthralgias. Negative for joint swelling.  Skin: Negative.  Negative for rash.  Neurological: Negative.  Negative for headaches.  Hematological: Negative.  Does not bruise/bleed easily.  Psychiatric/Behavioral: Negative for dysphoric mood. The patient is nervous/anxious.        Objective:   Physical Exam Constitutional:  Well developed, no acute distress  HENT:  Nares patent without discharge  Oropharynx without exudate, palate and uvula are normal  Eyes:  Perrla, eomi, no scleral icterus  Neck:  No JVD, no TMG  Cardiovascular:  Normal rate, regular rhythm, no rubs or gallops.  No murmurs        Intact distal pulses  Pulmonary :  Normal breath sounds, no stridor or respiratory distress   No rales, rhonchi, or wheezing  Abdominal:  Soft, nondistended, bowel sounds present.  No tenderness noted.   Musculoskeletal:  Minimal lower extremity edema noted.  Lymph Nodes:  No cervical lymphadenopathy noted  Skin:  No cyanosis noted  Neurologic:  Alert, appropriate, moves all 4 extremities without obvious deficit.         Assessment & Plan:

## 2011-08-16 NOTE — Assessment & Plan Note (Signed)
The patient has bilateral persistent pulmonary nodules that have grown slightly from his prior CT last year.  The right middle lobe nodule also now appears cavitary.  It is unclear whether this is an inflammatory process versus occult malignancy.  I doubt this is infectious in origin.  One possibility would be a vasculitis, and will therefore send blood work to evaluate for this.  I really think this needs to be biopsied with a surgical approach to obtain a definitive diagnosis.  There will be some increased risk with his recent myocardial infarction, but I suspect that he will do fine with the procedure.  I will discuss the case with Dr. [name], and followup his blood work.

## 2011-08-16 NOTE — Patient Instructions (Signed)
Will send bloodwork to look for possible vasculitis If this is unremarkable, would recommend a surgical biopsy of the right sided spot.

## 2011-08-17 ENCOUNTER — Telehealth: Payer: Self-pay | Admitting: *Deleted

## 2011-08-17 ENCOUNTER — Encounter (HOSPITAL_COMMUNITY): Payer: Medicare Other

## 2011-08-17 NOTE — Telephone Encounter (Signed)
lmom labs ok 

## 2011-08-17 NOTE — Telephone Encounter (Signed)
Message copied by Tarri Fuller on Fri Aug 17, 2011  1:07 PM ------      Message from: [place], [place] T      Created: Thu Aug 16, 2011  4:39 PM       [place], [place]  4:39 PM 08/16/2011

## 2011-08-20 ENCOUNTER — Encounter (HOSPITAL_COMMUNITY): Payer: Medicare Other

## 2011-08-21 ENCOUNTER — Telehealth: Payer: Self-pay | Admitting: Pulmonary Disease

## 2011-08-21 ENCOUNTER — Ambulatory Visit (INDEPENDENT_AMBULATORY_CARE_PROVIDER_SITE_OTHER): Payer: Medicare Other | Admitting: Cardiovascular Disease

## 2011-08-21 ENCOUNTER — Encounter: Payer: Self-pay | Admitting: Cardiovascular Disease

## 2011-08-21 VITALS — BP 119/79 | HR 77 | Wt 222.0 lb

## 2011-08-21 DIAGNOSIS — D381 Neoplasm of uncertain behavior of trachea, bronchus and lung: Secondary | ICD-10-CM

## 2011-08-21 DIAGNOSIS — I2510 Atherosclerotic heart disease of native coronary artery without angina pectoris: Secondary | ICD-10-CM

## 2011-08-21 DIAGNOSIS — F3289 Other specified depressive episodes: Secondary | ICD-10-CM

## 2011-08-21 DIAGNOSIS — F329 Major depressive disorder, single episode, unspecified: Secondary | ICD-10-CM

## 2011-08-21 DIAGNOSIS — E785 Hyperlipidemia, unspecified: Secondary | ICD-10-CM

## 2011-08-21 NOTE — Telephone Encounter (Signed)
Tyler Mayo, please check with lab today to see if they have cooking or finalized: Anti gbm antibody QUANTITATIVE anca P and  C

## 2011-08-21 NOTE — Assessment & Plan Note (Signed)
Cholesterol is at goal.  Continue current dose of statin and diet Rx.  No myalgias or side effects.  F/U  LFT's in 6 months. Lab Results  Component Value Date   LDLCALC 68 08/16/2011             

## 2011-08-21 NOTE — Progress Notes (Signed)
Tyler Mayo, please check and make sure they have in the lab cooking: Anti GBM antibody QUANTITATIVE anca P and C

## 2011-08-21 NOTE — Patient Instructions (Signed)
Your physician recommends that you schedule a follow-up appointment in: 2 MONTHS WITH DR  NISHAN  Your physician recommends that you continue on your current medications as directed. Please refer to the Current Medication list given to you today. 

## 2011-08-21 NOTE — Assessment & Plan Note (Signed)
Dr [name] is anxious to operate on two lesions on right side.  Agree that PET/morphologic appearance likely tumor.  Need to have oncology involed and ? Tissue diagnosis if surgery is postponed 3 months

## 2011-08-21 NOTE — Assessment & Plan Note (Signed)
F/U primary may need meds to deal with medical issues and uncertainty of his current choices

## 2011-08-21 NOTE — Progress Notes (Signed)
Patient ID: Tyler Mayo, male   DOB: Feb 03, 1941, 71 y.o.   MRN: [id number] Complicated 71 yo with CAD.  Had inferior wall MI 2/2 Rx by Dr Garnette Scheuermann with DES stent.  Had staged intervention to proximal LAD with Promus DES 2/22.  EF ok at 50-55%.  Unfortunately also being seen by Dr Shelle Iron and [name] for likely lung cancer. Has PET positive nodules particularly in RML.  Needs to have VATS with wedge resection.  Personaly spoke with Dr [name] yesterday.  He would like to proceed in June.  Discussed issues of stopping dual antiplatlet Rx early after MI and two DES stents placed.  Very high risk for stent thrombosis.  Patient is very fearful and thinks he is going to die every day.  The risks and benefits of doing surgery early to prevent spread and waiting to decrease risk of stent thrombosis discussed at length.  I reviewed his CT and the RML lesion is cavitary and PET positive so I think although there is no tissue documentation very likely CA.    Also spoke with Dr Alice Reichert in the office today and he agreed risk of stopping dual antiplatlet Rx before the first year is high and 6 months may be best compromise  My bias would be to wait until August and proceed with surgery at 6 months out from MI/last stent Rx.  This may be the best compromise to prevent stent thrombosis vs spread of cancer.  Will have to discuss adjuvant RX like XRT or chemo with his other doctors if we wait.  ROS: Denies fever, malais, weight loss, blurry vision, decreased visual acuity, cough, sputum, SOB, hemoptysis, pleuritic pain, palpitaitons, heartburn, abdominal pain, melena, lower extremity edema, claudication, or rash.  All other systems reviewed and negative  General: Affect appropriate Healthy:  appears stated age HEENT: normal Neck supple with no adenopathy JVP normal no bruits no thyromegaly Lungs clear with no wheezing and good diaphragmatic motion Heart:  S1/S2 no murmur, no rub, gallop or click PMI  normal Abdomen: benighn, BS positve, no tenderness, no AAA no bruit.  No HSM or HJR Distal pulses intact with no bruits No edema Neuro non-focal Skin warm and dry No muscular weakness   Current Outpatient Prescriptions  Medication Sig Dispense Refill  . aspirin 81 MG tablet Take 81 mg by mouth daily.      . clonazePAM (KLONOPIN) 0.5 MG tablet Take 0.5 mg by mouth 2 (two) times daily as needed. For anxiety      . gabapentin (NEURONTIN) 100 MG capsule Take 100 mg by mouth 4 (four) times daily. Dose is being titrated up by Dr. Prince Rome.  Patient reports currently taking 4 capsules each day (06/2011)      . HYDROcodone-acetaminophen (NORCO) 5-325 MG per tablet Take 1 tablet by mouth every 4 (four) hours as needed. For pain      . metoprolol tartrate (LOPRESSOR) 25 MG tablet Take 12.5 mg by mouth 2 (two) times daily.      . nitroGLYCERIN (NITROSTAT) 0.4 MG SL tablet Place 0.4 mg under the tongue every 5 (five) minutes as needed. For chest pain      . prasugrel (EFFIENT) 10 MG TABS Take 10 mg by mouth daily.      . rosuvastatin (CRESTOR) 10 MG tablet Take 1 tablet (10 mg total) by mouth at bedtime.  30 tablet  11  . VIIBRYD 10 MG TABS Take 1 tablet by mouth Daily.  Allergies  Lipitor  Electrocardiogram:  NSR rate 69 LAE otherwise normal  Assessment and Plan

## 2011-08-21 NOTE — Assessment & Plan Note (Signed)
IMI with DES stent acutely to RCA on 2/2 and elective staged DES to proximal LAD 2/22.  High risk for stent thrombosis for dual antiplatlet Rx to be stopped now.  Favor compromise of 6 months as best hedge between presumed tumor spread and risk of MI.  Will ask Dr [place] Nation if Rx him in the interim or needle biopsy and Rx with XRT/chemo would be an option until wedge resection can be done in August.

## 2011-08-22 ENCOUNTER — Encounter (HOSPITAL_COMMUNITY): Payer: Medicare Other

## 2011-08-22 ENCOUNTER — Telehealth: Payer: Self-pay | Admitting: Pulmonary Disease

## 2011-08-22 NOTE — Telephone Encounter (Signed)
Let pt know is still pending, and I will call him as soon as I receive the results.

## 2011-08-22 NOTE — Telephone Encounter (Signed)
Called spoke with patient to make him aware lab results are still pending and we will call as soon as they are available.  Pt okay with this and verbalized his understanding.  Will sign and forward to [facility] as FYI.

## 2011-08-22 NOTE — Telephone Encounter (Signed)
Per [facility], anti gbm is still in process. ANCA was received as a separate order and has not been done but they will cross reference this with specimens received and run the test. The results should be ready in a few days.

## 2011-08-22 NOTE — Telephone Encounter (Signed)
Duplicate message. See phone note 08/21/11. Crystal has already spoke with pt at 1:50 today

## 2011-08-23 ENCOUNTER — Telehealth: Payer: Self-pay | Admitting: Pulmonary Disease

## 2011-08-23 NOTE — Telephone Encounter (Signed)
lori have you seen any of this pts lab results?  Please advise. thanks

## 2011-08-24 ENCOUNTER — Telehealth: Payer: Self-pay | Admitting: Pulmonary Disease

## 2011-08-24 ENCOUNTER — Encounter (HOSPITAL_COMMUNITY): Payer: Medicare Other

## 2011-08-24 LAB — ANCA SCREEN W REFLEX TITER
Atypical p-ANCA Screen: NEGATIVE
c-ANCA Screen: NEGATIVE

## 2011-08-24 LAB — ANCA TITERS

## 2011-08-24 NOTE — Telephone Encounter (Signed)
Still waiting on the most important labs.  Some of his labwork suggests a possible autoimmune process.

## 2011-08-24 NOTE — Telephone Encounter (Signed)
Duplicate msg.

## 2011-08-24 NOTE — Telephone Encounter (Signed)
KC, pt has called again requesting that you review and advise on his labs that were drawn here on Aug 23, 2011;   ANCA Screen Reflex Titer [[id number] Custom] Expires: 08/22/12 Glomerular basement membrane antibodies [LAB727 Custom] Expires: 08-22-12 Sedimentation rate [LAB322 Custom] Expires: 08/22/12 ANA [LAB147 Custom] Expires: 08/22/2012 Rheumatoid factor [LAB206 Custom] Expires: 08/22/12   Please advise; pt is concerned and had multiple questions regarding the results-i.e. If there is any cancer. Looks as though [facility] had reviewed some and advise labs were ok. Please confirm this as well. Thanks.   Pt wants call back TODAY!!!

## 2011-08-24 NOTE — Telephone Encounter (Signed)
Pt is aware that we are waiting on most important labs and will call him with results as soon as we get them.

## 2011-08-27 ENCOUNTER — Encounter (HOSPITAL_COMMUNITY): Payer: Medicare Other

## 2011-08-27 ENCOUNTER — Telehealth (HOSPITAL_COMMUNITY): Payer: Self-pay | Admitting: Cardiac Rehabilitation

## 2011-08-27 ENCOUNTER — Telehealth: Payer: Self-pay | Admitting: Pulmonary Disease

## 2011-08-27 ENCOUNTER — Other Ambulatory Visit: Payer: Self-pay | Admitting: Pulmonary Disease

## 2011-08-27 DIAGNOSIS — R918 Other nonspecific abnormal finding of lung field: Secondary | ICD-10-CM

## 2011-08-27 NOTE — Telephone Encounter (Signed)
Spoke with pt's spouse. She states that the pt got a call today from Dr Scheryl Darter office calling to schedule lung surgery. She is confused b/c she thought we were waiting on all of the labs to come back before making this decision.  I advised that I do not see where we made any thoracic surgery yet. Will check with KC to be sure. Please advise, thanks!

## 2011-08-27 NOTE — Telephone Encounter (Signed)
I spoke with the to wife and advised per instructions and last phone note from 08-24-11 that Dr. Shelle Iron is waiting on all results to decide on next step. I advised that we ar waiting on some results but according to Dr. Shelle Iron it is looking like an autoimmune process. I again advised the wife of this and that we will call once all results are in. She states understanding. Carron Curie, CMA

## 2011-08-29 ENCOUNTER — Encounter (HOSPITAL_COMMUNITY)
Admission: RE | Admit: 2011-08-29 | Discharge: 2011-08-29 | Disposition: A | Discharge status: Home / Self Care | Payer: Medicare Other | Source: Ambulatory Visit | Attending: Cardiovascular Disease | Admitting: Cardiovascular Disease

## 2011-08-31 ENCOUNTER — Encounter (HOSPITAL_COMMUNITY)
Admission: RE | Admit: 2011-08-31 | Discharge: 2011-08-31 | Disposition: A | Discharge status: Home / Self Care | Payer: Medicare Other | Source: Ambulatory Visit | Attending: Cardiovascular Disease | Admitting: Cardiovascular Disease

## 2011-09-04 ENCOUNTER — Ambulatory Visit: Payer: Medicare Other | Admitting: Thoracic Surgery (Cardiothoracic Vascular Surgery)

## 2011-09-04 NOTE — Telephone Encounter (Signed)
done

## 2011-09-05 ENCOUNTER — Ambulatory Visit: Payer: Medicare Other | Admitting: Thoracic Surgery (Cardiothoracic Vascular Surgery)

## 2011-09-05 ENCOUNTER — Encounter (HOSPITAL_COMMUNITY): Payer: Medicare Other

## 2011-09-07 ENCOUNTER — Encounter (HOSPITAL_COMMUNITY)
Admission: RE | Admit: 2011-09-07 | Discharge: 2011-09-07 | Disposition: A | Discharge status: Home / Self Care | Payer: Medicare Other | Source: Ambulatory Visit | Attending: Cardiovascular Disease | Admitting: Cardiovascular Disease

## 2011-09-10 ENCOUNTER — Encounter (HOSPITAL_COMMUNITY): Payer: Medicare Other

## 2011-09-12 ENCOUNTER — Encounter (HOSPITAL_COMMUNITY): Payer: Medicare Other

## 2011-09-14 ENCOUNTER — Encounter (HOSPITAL_COMMUNITY)
Admission: RE | Admit: 2011-09-14 | Discharge: 2011-09-14 | Disposition: A | Discharge status: Home / Self Care | Payer: Medicare Other | Source: Ambulatory Visit | Attending: Cardiovascular Disease | Admitting: Cardiovascular Disease

## 2011-09-14 ENCOUNTER — Telehealth: Payer: Self-pay | Admitting: Cardiovascular Disease

## 2011-09-14 DIAGNOSIS — I2510 Atherosclerotic heart disease of native coronary artery without angina pectoris: Secondary | ICD-10-CM | POA: Insufficient documentation

## 2011-09-14 DIAGNOSIS — I252 Old myocardial infarction: Secondary | ICD-10-CM | POA: Insufficient documentation

## 2011-09-14 DIAGNOSIS — E785 Hyperlipidemia, unspecified: Secondary | ICD-10-CM | POA: Insufficient documentation

## 2011-09-14 DIAGNOSIS — Z5189 Encounter for other specified aftercare: Secondary | ICD-10-CM | POA: Insufficient documentation

## 2011-09-14 DIAGNOSIS — F17200 Nicotine dependence, unspecified, uncomplicated: Secondary | ICD-10-CM | POA: Insufficient documentation

## 2011-09-14 DIAGNOSIS — Z9861 Coronary angioplasty status: Secondary | ICD-10-CM | POA: Insufficient documentation

## 2011-09-14 NOTE — Telephone Encounter (Signed)
F/u   Patient would like nurse to call in copay card information for Effient RX called or faxed into [address]. To expedite refill. Patient is as Cardiac Rehab and may not be available for return call.  [phone]

## 2011-09-14 NOTE — Telephone Encounter (Signed)
New msg Pt wants to get copay card for effient. His has expired. He uses [employer] [facility] [address]

## 2011-09-14 NOTE — Telephone Encounter (Signed)
Husband calling about getting effient co-pay card--advised card will be at front desk to pic-up--also called wife and notified her

## 2011-09-17 ENCOUNTER — Encounter (HOSPITAL_COMMUNITY): Payer: Medicare Other

## 2011-09-18 ENCOUNTER — Ambulatory Visit: Payer: Medicare Other | Admitting: Thoracic Surgery (Cardiothoracic Vascular Surgery)

## 2011-09-19 ENCOUNTER — Encounter: Payer: Self-pay | Admitting: Thoracic Surgery (Cardiothoracic Vascular Surgery)

## 2011-09-19 ENCOUNTER — Ambulatory Visit (INDEPENDENT_AMBULATORY_CARE_PROVIDER_SITE_OTHER): Payer: Medicare Other | Admitting: Thoracic Surgery (Cardiothoracic Vascular Surgery)

## 2011-09-19 ENCOUNTER — Encounter (HOSPITAL_COMMUNITY): Payer: Medicare Other

## 2011-09-19 VITALS — BP 116/78 | HR 82 | Resp 16 | Ht 70.0 in | Wt 220.0 lb

## 2011-09-19 DIAGNOSIS — R918 Other nonspecific abnormal finding of lung field: Secondary | ICD-10-CM

## 2011-09-19 NOTE — Progress Notes (Signed)
HPI:  Tyler Mayo presents today for followup of his lung nodules. He has been followed by Dr. [name] for the bilateral lung nodules back to May of 2012. The nodules were first noted in 2012 chest x-ray. They were new from February of 2011. A PET CT was done which showed mild hypermetabolic uptake in both nodules. Dr. [name] did an ENB. Pathology showed chronic inflammation with an abundance of plasma cells, histiocytes and multi-nucleated giant cells. No cancer was seen. The nodules continue to be followed and did show some signs of growth although that may have been smaller in the interval between his last 2 scans from September of 2012 to April of this year. In the interim he had an acute MI in February of this year. He was treated with PTCA and stenting and is on prasugrel for the stent. In April was decided that he should wait at least until it had been 6 months from his MI prior to undergoing a type of surgical procedure. In the meantime Dr. Shelle Iron has run some autoimmune tests and referred him to Dr. Dierdre Mayo of rheumatology for further assessment.  Tyler Mayo states that he is very reluctant to have surgery under any circumstances, and is very anxious about the possibility that he could have lung cancer.  Past Medical History  Diagnosis Date  . Lung nodules     s/p bx with Dr. [name] 9/12  . Depression with anxiety   . Erectile dysfunction   . CAD (coronary artery disease)     a.  8.2012 neg MV;  b.  05/12/2011 Inf STEMI - 100% RCA - 3.5x22 Resolute DES, Residual 80% LAD dzs. c. s/p planned DES to LAD 06/01/11.  Marland Kitchen Hyperlipidemia   . History of tobacco abuse       Current Outpatient Prescriptions  Medication Sig Dispense Refill  . aspirin 81 MG tablet Take 81 mg by mouth daily.      . clonazePAM (KLONOPIN) 0.5 MG tablet Take 0.5 mg by mouth 2 (two) times daily as needed. For anxiety      . gabapentin (NEURONTIN) 100 MG capsule Take 100 mg by mouth 4 (four) times daily. Dose is  being titrated up by Dr. Prince Rome.  Patient reports currently taking 4 capsules each day (06/2011)      . HYDROcodone-acetaminophen (NORCO) 5-325 MG per tablet Take 1 tablet by mouth every 4 (four) hours as needed. For pain      . metoprolol tartrate (LOPRESSOR) 25 MG tablet Take 12.5 mg by mouth 2 (two) times daily.      . nitroGLYCERIN (NITROSTAT) 0.4 MG SL tablet Place 0.4 mg under the tongue every 5 (five) minutes as needed. For chest pain      . prasugrel (EFFIENT) 10 MG TABS Take 10 mg by mouth daily.      . rosuvastatin (CRESTOR) 10 MG tablet Take 1 tablet (10 mg total) by mouth at bedtime.  30 tablet  11  . VIIBRYD 10 MG TABS Take 1 tablet by mouth Daily.        Physical Exam BP 116/78  Pulse 82  Resp 16  Ht 5\' 10"  (1.778 m)  Wt 220 lb (99.791 kg)  BMI 31.57 kg/m2  SpO2 96% Anxious but otherwise well-appearing 71 year old male in no acute distress No cervical or supraclavicular adenopathy Lungs clear with equal breath sounds bilaterally Cardiac regular rate and rhythm normal S1 and S2  Diagnostic Tests: CT from April reviewed  Impression: 71 year old gentleman with bilateral  lung nodules. A PET in the past has shown these to be mildly hypermetabolic. Biopsies have shown chronic inflammatory changes. There is a good possibility that this all represents an autoimmune disorder. He is currently being worked up for that.  I think it is unlikely that these lung nodules represent cancer, although I cannot entirely rule out the possibility. It is more likely that these are inflammatory, and probably related to some type of systemic disorder.  I recommended to Tyler Mayo that we repeat his CT scan in late July or early August (6 weeks from now) in about 3 months from his last scan. That will also be at 6 months from his MI. By that time we should have results on his autoimmune disorder workup. And then we can decide at that time whether these nodules should continue to be followed or whether  biopsy is warranted.   Plan: Repeat CT of the chest and followup in 6 weeks

## 2011-09-21 ENCOUNTER — Encounter (HOSPITAL_COMMUNITY): Admission: RE | Admit: 2011-09-21 | Payer: Medicare Other | Source: Ambulatory Visit

## 2011-09-21 ENCOUNTER — Telehealth (HOSPITAL_COMMUNITY): Payer: Self-pay | Admitting: Cardiac Rehabilitation

## 2011-09-21 NOTE — Telephone Encounter (Signed)
pc received from pt c/o joint pain, off and on, however persistent since Wednesday.  Pt states it is sometimes exercerbated with cardiac rehab exercises.  Pt having joint pain today, pt questions if could be r/t his crestor.   Pt states pain is somewhat relieved with pain medication.  Pt states he has called his PCP is awaiting a call back.  Pt also states he was seen this week by a rheumatologist.  Pt advised to contact cardiology for concerns re:  Crestor.  Pt also advised to f/u with PCP.  Pt states he will also contact the rheumatologist as he failed to mention this discomfort to him at his consult. Pt will be absent from exercise today, however hopes to return on Monday.

## 2011-09-24 ENCOUNTER — Encounter (HOSPITAL_COMMUNITY): Payer: Medicare Other

## 2011-09-26 ENCOUNTER — Encounter (HOSPITAL_COMMUNITY)
Admission: RE | Admit: 2011-09-26 | Discharge: 2011-09-26 | Disposition: A | Discharge status: Home / Self Care | Payer: Medicare Other | Source: Ambulatory Visit | Attending: Cardiovascular Disease | Admitting: Cardiovascular Disease

## 2011-09-26 NOTE — Progress Notes (Signed)
Talked with pt regarding his remark regarding nose bleeds.  Upon further assessment pt has nose bleeds about twice in three week period.  Pt is on efient.  Talked to pt about the side effects of this medication.  Offered suggestions to keep his nasal membrane moist.  Pt advised if nose bleeds increase in frequency to notify his physician.  Pt verbalized understanding.

## 2011-09-28 ENCOUNTER — Encounter (HOSPITAL_COMMUNITY)
Admission: RE | Admit: 2011-09-28 | Discharge: 2011-09-28 | Disposition: A | Discharge status: Home / Self Care | Payer: Medicare Other | Source: Ambulatory Visit | Attending: Cardiovascular Disease | Admitting: Cardiovascular Disease

## 2011-10-01 ENCOUNTER — Encounter (HOSPITAL_COMMUNITY)
Admission: RE | Admit: 2011-10-01 | Discharge: 2011-10-01 | Disposition: A | Discharge status: Home / Self Care | Payer: Medicare Other | Source: Ambulatory Visit | Attending: Cardiovascular Disease | Admitting: Cardiovascular Disease

## 2011-10-03 ENCOUNTER — Encounter (HOSPITAL_COMMUNITY): Payer: Medicare Other

## 2011-10-05 ENCOUNTER — Encounter (HOSPITAL_COMMUNITY)
Admission: RE | Admit: 2011-10-05 | Discharge: 2011-10-05 | Disposition: A | Discharge status: Home / Self Care | Payer: Medicare Other | Source: Ambulatory Visit | Attending: Cardiovascular Disease | Admitting: Cardiovascular Disease

## 2011-10-08 ENCOUNTER — Encounter (HOSPITAL_COMMUNITY): Payer: Medicare Other

## 2011-10-10 ENCOUNTER — Encounter (HOSPITAL_COMMUNITY): Payer: Medicare Other

## 2011-10-12 ENCOUNTER — Telehealth (HOSPITAL_COMMUNITY): Payer: Self-pay | Admitting: Cardiac Rehabilitation

## 2011-10-12 ENCOUNTER — Encounter (HOSPITAL_COMMUNITY): Admission: RE | Admit: 2011-10-12 | Payer: Medicare Other | Source: Ambulatory Visit

## 2011-10-15 ENCOUNTER — Encounter (HOSPITAL_COMMUNITY)
Admission: RE | Admit: 2011-10-15 | Discharge: 2011-10-15 | Disposition: A | Discharge status: Home / Self Care | Payer: Medicare Other | Source: Ambulatory Visit | Attending: Cardiovascular Disease | Admitting: Cardiovascular Disease

## 2011-10-15 DIAGNOSIS — I2510 Atherosclerotic heart disease of native coronary artery without angina pectoris: Secondary | ICD-10-CM | POA: Insufficient documentation

## 2011-10-15 DIAGNOSIS — Z9861 Coronary angioplasty status: Secondary | ICD-10-CM | POA: Insufficient documentation

## 2011-10-15 DIAGNOSIS — F17200 Nicotine dependence, unspecified, uncomplicated: Secondary | ICD-10-CM | POA: Insufficient documentation

## 2011-10-15 DIAGNOSIS — E785 Hyperlipidemia, unspecified: Secondary | ICD-10-CM | POA: Insufficient documentation

## 2011-10-15 DIAGNOSIS — I252 Old myocardial infarction: Secondary | ICD-10-CM | POA: Insufficient documentation

## 2011-10-15 DIAGNOSIS — Z5189 Encounter for other specified aftercare: Secondary | ICD-10-CM | POA: Insufficient documentation

## 2011-10-17 ENCOUNTER — Encounter (HOSPITAL_COMMUNITY)
Admission: RE | Admit: 2011-10-17 | Discharge: 2011-10-17 | Disposition: A | Discharge status: Home / Self Care | Payer: Medicare Other | Source: Ambulatory Visit | Attending: Cardiovascular Disease | Admitting: Cardiovascular Disease

## 2011-10-19 ENCOUNTER — Encounter (HOSPITAL_COMMUNITY): Payer: Medicare Other

## 2011-10-22 ENCOUNTER — Other Ambulatory Visit: Payer: Self-pay | Admitting: Thoracic Surgery (Cardiothoracic Vascular Surgery)

## 2011-10-22 ENCOUNTER — Encounter (HOSPITAL_COMMUNITY): Payer: Medicare Other

## 2011-10-22 DIAGNOSIS — D381 Neoplasm of uncertain behavior of trachea, bronchus and lung: Secondary | ICD-10-CM

## 2011-10-24 ENCOUNTER — Encounter (HOSPITAL_COMMUNITY): Payer: Medicare Other

## 2011-10-25 ENCOUNTER — Ambulatory Visit: Payer: Medicare Other | Admitting: Cardiovascular Disease

## 2011-10-26 ENCOUNTER — Encounter (HOSPITAL_COMMUNITY): Payer: Medicare Other

## 2011-10-29 ENCOUNTER — Encounter (HOSPITAL_COMMUNITY)
Admission: RE | Admit: 2011-10-29 | Discharge: 2011-10-29 | Disposition: A | Discharge status: Home / Self Care | Payer: Medicare Other | Source: Ambulatory Visit | Attending: Cardiovascular Disease | Admitting: Cardiovascular Disease

## 2011-10-31 ENCOUNTER — Encounter (HOSPITAL_COMMUNITY): Payer: Medicare Other

## 2011-11-02 ENCOUNTER — Encounter (HOSPITAL_COMMUNITY)
Admission: RE | Admit: 2011-11-02 | Discharge: 2011-11-02 | Disposition: A | Discharge status: Home / Self Care | Payer: Medicare Other | Source: Ambulatory Visit | Attending: Cardiovascular Disease | Admitting: Cardiovascular Disease

## 2011-11-05 ENCOUNTER — Encounter (HOSPITAL_COMMUNITY): Payer: Medicare Other

## 2011-11-07 ENCOUNTER — Encounter (HOSPITAL_COMMUNITY): Payer: Medicare Other

## 2011-11-09 ENCOUNTER — Encounter (HOSPITAL_COMMUNITY): Payer: Medicare Other

## 2011-11-12 ENCOUNTER — Telehealth (HOSPITAL_COMMUNITY): Payer: Self-pay | Admitting: Cardiac Rehabilitation

## 2011-11-12 NOTE — Telephone Encounter (Signed)
Pt states he has been not feeling well and is distressed today about his upcoming PET scan. Pt states he is interested in continuing cardiac rehab program in the maintenance program.  Home exercise packet and maintenance information will be mailed to pt.

## 2011-11-13 ENCOUNTER — Ambulatory Visit
Admission: RE | Admit: 2011-11-13 | Discharge: 2011-11-13 | Disposition: A | Discharge status: Home / Self Care | Payer: Medicare Other | Source: Ambulatory Visit | Attending: Thoracic Surgery (Cardiothoracic Vascular Surgery) | Admitting: Thoracic Surgery (Cardiothoracic Vascular Surgery)

## 2011-11-13 ENCOUNTER — Ambulatory Visit (INDEPENDENT_AMBULATORY_CARE_PROVIDER_SITE_OTHER): Payer: Medicare Other | Admitting: Thoracic Surgery (Cardiothoracic Vascular Surgery)

## 2011-11-13 ENCOUNTER — Encounter: Payer: Self-pay | Admitting: Thoracic Surgery (Cardiothoracic Vascular Surgery)

## 2011-11-13 VITALS — BP 110/75 | HR 75 | Resp 18 | Ht 70.0 in | Wt 226.0 lb

## 2011-11-13 DIAGNOSIS — D381 Neoplasm of uncertain behavior of trachea, bronchus and lung: Secondary | ICD-10-CM

## 2011-11-13 DIAGNOSIS — R918 Other nonspecific abnormal finding of lung field: Secondary | ICD-10-CM

## 2011-11-13 NOTE — Progress Notes (Signed)
HPI:  [name] returns today for followup of his right middle lobe and left upper lobe lung nodules. These were first found over a year ago. He was worked up by Dr. [name]. He had biopsies done which showed inflammatory changes but no evidence of malignancy. Dr. [name] continue to follow him with serial CT scans. He recently underwent a rheumatologic workup but says that nothing was found. He returns for a scheduled CT scan to followup these nodules..  He states that his been feeling well. He's not had any cardiac issues. He has an appointment with Dr. Eden Emms later this week. He's not had any cough, hemoptysis, fevers or chills.  Past Medical History  Diagnosis Date  . Lung nodules     s/p bx with Dr. [name] 9/12  . Depression with anxiety   . Erectile dysfunction   . CAD (coronary artery disease)     a.  8.2012 neg MV;  b.  05/12/2011 Inf STEMI - 100% RCA - 3.5x22 Resolute DES, Residual 80% LAD dzs. c. s/p planned DES to LAD 06/01/11.  Marland Kitchen Hyperlipidemia   . History of tobacco abuse       Current Outpatient Prescriptions  Medication Sig Dispense Refill  . aspirin 81 MG tablet Take 81 mg by mouth daily.      . clonazePAM (KLONOPIN) 0.5 MG tablet Take 0.5 mg by mouth 2 (two) times daily as needed. For anxiety      . gabapentin (NEURONTIN) 100 MG capsule Take 100 mg by mouth 4 (four) times daily. Dose is being titrated up by Dr. Prince Rome.  Patient reports currently taking 4 capsules each day (06/2011)      . HYDROcodone-acetaminophen (NORCO) 5-325 MG per tablet Take 1 tablet by mouth every 4 (four) hours as needed. For pain      . metoprolol tartrate (LOPRESSOR) 25 MG tablet Take 12.5 mg by mouth 2 (two) times daily.      . nitroGLYCERIN (NITROSTAT) 0.4 MG SL tablet Place 0.4 mg under the tongue every 5 (five) minutes as needed. For chest pain      . prasugrel (EFFIENT) 10 MG TABS Take 10 mg by mouth daily.      . rosuvastatin (CRESTOR) 10 MG tablet Take 1 tablet (10 mg total) by mouth at  bedtime.  30 tablet  11  . VIIBRYD 10 MG TABS Take 1 tablet by mouth Daily.        Physical Exam BP 110/75  Pulse 75  Resp 18  Ht 5\' 10"  (1.778 m)  Wt 226 lb (102.513 kg)  BMI 32.43 kg/m2  SpO29 19% 71 year old male in no acute distress No cervical or subclavicular adenopathy Lungs clear equal breath sounds Cardiac regular rate and rhythm normal S1 and S2  Diagnostic Tests: CT of chest 11/13/2011  Showed no significant change in the left upper lobe nodule, which the radiologist feels is suspicious for low-grade adenocarcinoma. There has been a slight increase in the size of the cavitary right middle lobe nodule.  Impression: 71 year old gentleman with bilateral lung nodules. These have been present for over year now. He has had biopsies in the past which showed evidence of inflammation. And more than likely these are inflammatory masses. However his rheumatologic workup was negative and the nodules of continued to slowly increase in size, at least the right middle lobe nodule. I had a long discussion with Mr. and Mrs. Tyler Mayo regarding these nodules and their management. At this point, with the increase in size the right  middle lobe nodule, I recommend proceeding with a right vats and wedge resection of the right middle lobe nodule for definitive diagnosis, and possible lobectomy depending on findings. Again this more likely would be inflammatory. However I cannot rule out the possibility that there is a malignant component.  Another option is to repeat a biopsy. Unfortunately, additional biopsies via bronchoscopy or with CT guidance cannot definitively rule out the possibility of cancer either. I think the yield from either of those approaches is going to be low. Finally the other option would be to continue to follow this with CT scans.    Plan: [name] does not want to pursue surgery an excisional biopsy at this point in time and wishes to be followed with serial CT scans.  I will  plan to see him back in 3 months

## 2011-11-15 ENCOUNTER — Ambulatory Visit (INDEPENDENT_AMBULATORY_CARE_PROVIDER_SITE_OTHER): Payer: Medicare Other | Admitting: Cardiovascular Disease

## 2011-11-15 ENCOUNTER — Encounter: Payer: Self-pay | Admitting: Cardiovascular Disease

## 2011-11-15 VITALS — BP 145/85 | HR 112 | Ht 70.0 in | Wt 223.0 lb

## 2011-11-15 DIAGNOSIS — E785 Hyperlipidemia, unspecified: Secondary | ICD-10-CM

## 2011-11-15 DIAGNOSIS — I2510 Atherosclerotic heart disease of native coronary artery without angina pectoris: Secondary | ICD-10-CM

## 2011-11-15 DIAGNOSIS — F329 Major depressive disorder, single episode, unspecified: Secondary | ICD-10-CM

## 2011-11-15 DIAGNOSIS — D381 Neoplasm of uncertain behavior of trachea, bronchus and lung: Secondary | ICD-10-CM

## 2011-11-15 NOTE — Patient Instructions (Addendum)
Your physician wants you to follow-up in: 3 months. You will receive a reminder letter in the mail two months in advance. If you don't receive a letter, please call our office to schedule the follow-up appointment.   

## 2011-11-15 NOTE — Assessment & Plan Note (Signed)
Clear to have surgery.  PET positive and advised to have surgery.  Call Dr [name] to schedule

## 2011-11-15 NOTE — Assessment & Plan Note (Signed)
Continue xanax.  Tried to discuss importance of surgery to prevent cancer spread

## 2011-11-15 NOTE — Assessment & Plan Note (Signed)
Stable with no angina and good activity level.  Continue medical Rx Stop Effient 5 days before surgery and continue beta blocker

## 2011-11-15 NOTE — Progress Notes (Signed)
Patient ID: Tyler Mayo, male   DOB: 08/02/1940, 71 y.o.   MRN: [id number] Complicated 71 yo with CAD. Had inferior wall MI 2/2 Rx by Dr Garnette Scheuermann with DES stent. Had staged intervention to proximal LAD with Promus DES 2/22. EF ok at 50-55%. Unfortunately also being seen by Dr Shelle Iron and [name] for likely lung cancer. Has PET positive nodules particularly in RML. Needs to have VATS with wedge resection. Personaly spoke with Dr [name] yesterday. He would like to proceed in June. Discussed issues of stopping dual antiplatlet Rx early after MI and two DES stents placed. Very high risk for stent thrombosis. Patient is very fearful and thinks he is going to die every day. The risks and benefits of doing surgery early to prevent spread and waiting to decrease risk of stent thrombosis discussed at length. I reviewed his CT and the RML lesion is cavitary and PET positive so I think although there is no tissue documentation very likely CA.  Also spoke with Dr Alice Reichert in the office today and he agreed risk of stopping dual antiplatlet Rx before the first year is high and 6 months may be best compromise  My bias would be to wait until August and proceed with surgery at 6 months out from MI/last stent Rx. This may be the best compromise to prevent stent thrombosis vs spread of cancer. Will have to discuss adjuvant RX like XRT or chemo with his other doctors if we wait.  Still anxious  Needs to schedule surgery with Dr [name].  Clear now that he has had 6 months of Effient.    ROS: Denies fever, malais, weight loss, blurry vision, decreased visual acuity, cough, sputum, SOB, hemoptysis, pleuritic pain, palpitaitons, heartburn, abdominal pain, melena, lower extremity edema, claudication, or rash.  All other systems reviewed and negative  General: Affect appropriate Healthy:  appears stated age HEENT: normal Neck supple with no adenopathy JVP normal no bruits no thyromegaly Lungs clear with no wheezing  and good diaphragmatic motion Heart:  S1/S2 no murmur, no rub, gallop or click PMI normal Abdomen: benighn, BS positve, no tenderness, no AAA no bruit.  No HSM or HJR Distal pulses intact with no bruits No edema Neuro non-focal Skin warm and dry No muscular weakness   Current Outpatient Prescriptions  Medication Sig Dispense Refill  . aspirin 81 MG tablet Take 81 mg by mouth daily.      . clonazePAM (KLONOPIN) 0.5 MG tablet Take 0.5 mg by mouth 2 (two) times daily as needed. For anxiety      . gabapentin (NEURONTIN) 100 MG capsule Take 100 mg by mouth 4 (four) times daily. Dose is being titrated up by Dr. Prince Rome.  Patient reports currently taking 4 capsules each day (06/2011)      . HYDROcodone-acetaminophen (NORCO) 5-325 MG per tablet Take 1 tablet by mouth every 4 (four) hours as needed. For pain      . metoprolol tartrate (LOPRESSOR) 25 MG tablet Take 12.5 mg by mouth 2 (two) times daily.      . nitroGLYCERIN (NITROSTAT) 0.4 MG SL tablet Place 0.4 mg under the tongue every 5 (five) minutes as needed. For chest pain      . prasugrel (EFFIENT) 10 MG TABS Take 10 mg by mouth daily.      . rosuvastatin (CRESTOR) 10 MG tablet Take 1 tablet (10 mg total) by mouth at bedtime.  30 tablet  11  . VIIBRYD 10 MG TABS Take 1 tablet by mouth  Daily.        Allergies  Lipitor  Electrocardiogram:  Assessment and Plan

## 2011-11-15 NOTE — Assessment & Plan Note (Signed)
Cholesterol is at goal.  Continue current dose of statin and diet Rx.  No myalgias or side effects.  F/U  LFT's in 6 months. Lab Results  Component Value Date   LDLCALC 68 08/16/2011             

## 2011-11-27 ENCOUNTER — Other Ambulatory Visit: Payer: Self-pay

## 2011-11-27 ENCOUNTER — Encounter (HOSPITAL_COMMUNITY): Payer: Self-pay | Admitting: Pharmacy Technician

## 2011-11-27 DIAGNOSIS — D381 Neoplasm of uncertain behavior of trachea, bronchus and lung: Secondary | ICD-10-CM

## 2011-11-29 ENCOUNTER — Other Ambulatory Visit: Payer: Self-pay

## 2011-11-29 ENCOUNTER — Encounter (HOSPITAL_COMMUNITY): Admission: RE | Admit: 2011-11-29 | Payer: Medicare Other | Source: Ambulatory Visit

## 2011-11-29 ENCOUNTER — Other Ambulatory Visit: Payer: Self-pay | Admitting: Family Medicine

## 2011-11-29 ENCOUNTER — Encounter (HOSPITAL_COMMUNITY): Payer: Self-pay

## 2011-11-29 ENCOUNTER — Encounter (HOSPITAL_COMMUNITY)
Admission: RE | Admit: 2011-11-29 | Discharge: 2011-11-29 | Disposition: A | Discharge status: Home / Self Care | Payer: Medicare Other | Source: Ambulatory Visit | Attending: Thoracic Surgery (Cardiothoracic Vascular Surgery) | Admitting: Thoracic Surgery (Cardiothoracic Vascular Surgery)

## 2011-11-29 VITALS — BP 118/62 | HR 82 | Temp 97.9°F | Resp 20 | Wt 225.8 lb

## 2011-11-29 DIAGNOSIS — D381 Neoplasm of uncertain behavior of trachea, bronchus and lung: Secondary | ICD-10-CM

## 2011-11-29 HISTORY — DX: Dorsalgia, unspecified: M54.9

## 2011-11-29 HISTORY — DX: Benign prostatic hyperplasia without lower urinary tract symptoms: N40.0

## 2011-11-29 HISTORY — DX: Other chronic pain: G89.29

## 2011-11-29 HISTORY — DX: Other skin changes: R23.8

## 2011-11-29 HISTORY — DX: Pain in unspecified joint: M25.50

## 2011-11-29 HISTORY — DX: Spontaneous ecchymoses: R23.3

## 2011-11-29 HISTORY — DX: Insomnia, unspecified: G47.00

## 2011-11-29 LAB — COMPREHENSIVE METABOLIC PANEL
CO2: 28 mEq/L (ref 19–32)
Calcium: 9.8 mg/dL (ref 8.4–10.5)
Creatinine, Ser: 1.12 mg/dL (ref 0.50–1.35)
GFR calc Af Amer: 76 mL/min — ABNORMAL LOW (ref >90)
GFR calc non Af Amer: 66 mL/min — ABNORMAL LOW (ref >90)
Glucose, Bld: 90 mg/dL (ref 70–99)

## 2011-11-29 LAB — BLOOD GAS, ARTERIAL
Acid-base deficit: 0.3 mmol/L (ref 0.0–2.0)
O2 Saturation: 93.3 %
TCO2: 25.1 mmol/L (ref 0–100)
pCO2 arterial: 39.4 mmHg (ref 35.0–45.0)

## 2011-11-29 LAB — CBC
Hemoglobin: 15.5 g/dL (ref 13.0–17.0)
MCH: 32.6 pg (ref 26.0–34.0)
MCV: 94.1 fL (ref 78.0–100.0)
RBC: 4.75 MIL/uL (ref 4.22–5.81)

## 2011-11-29 LAB — URINALYSIS, ROUTINE W REFLEX MICROSCOPIC
Bilirubin Urine: NEGATIVE
Nitrite: NEGATIVE
Specific Gravity, Urine: 1.028 (ref 1.005–1.030)
Urobilinogen, UA: 0.2 mg/dL (ref 0.0–1.0)

## 2011-11-29 LAB — PROTIME-INR
INR: 1.09 (ref 0.00–1.49)
Prothrombin Time: 14.3 seconds (ref 11.6–15.2)

## 2011-11-29 LAB — SURGICAL PCR SCREEN: Staphylococcus aureus: NEGATIVE

## 2011-11-29 NOTE — Progress Notes (Signed)
Pt is insisting that a PSA level be done;informed pt that his medical MD would have to give the order for that,not the surgeon.Insisted that i call them.Spoke with Kern Reap RN,nurse for Dr.Jeffrey [id number]) who received orders to have this done.Pt very satisfied

## 2011-11-29 NOTE — Progress Notes (Signed)
Pt states that every now and then he has slight pain in his right upper arm that doesn't last very long;denies chest pain,hasn't required any nitroglycerin;will let dr.nishan know

## 2011-11-29 NOTE — Progress Notes (Signed)
Dr.nishan is cardiologist with last visit a week ago  Heart cath in epic from 05/2011 Stress test in epic in 2012 Echo done many yrs ago  Medical MD is Dr.Jeff Tawanna Cooler

## 2011-11-29 NOTE — Pre-Procedure Instructions (Signed)
22 Tyler Mayo  11/29/2011   Your procedure is scheduled on:  Mon, July 26 @ 7:30 AM  Report to [facility] at 5:30 AM.  Call this number if you have problems the morning of surgery: [phone]   Remember:   Do not eat food:After Midnight.  Take these medicines the morning of surgery with A SIP OF WATER: Clonazepam(Klonopin),Gabapentin(Neurontin),Pain Pill(if needed),and Metoprolol(Lopressor)   Do not wear jewelry  Do not wear lotions, powders, or colognes  Men may shave face and neck.  Do not bring valuables to the hospital.  Contacts, dentures or bridgework may not be worn into surgery.  Leave suitcase in the car. After surgery it may be brought to your room.  For patients admitted to the hospital, checkout time is 11:00 AM the day of discharge.   Patients discharged the day of surgery will not be allowed to drive home.    Special Instructions: CHG Shower Use Special Wash: 1/2 bottle night before surgery and 1/2 bottle morning of surgery.   Please read over the following fact sheets that you were given: Pain Booklet, Coughing and Deep Breathing, Blood Transfusion Information, MRSA Information and Surgical Site Infection Prevention

## 2011-11-30 NOTE — Consult Note (Signed)
Anesthesia chart review: This is a 71 year old male who is scheduled for right video assisted thoracoscopy, wedge resection, possible lobectomy by Dr Viviann Spare C. [name] on Monday 03 December 2011.   Pre-op CXR is pending.  Pre-op lab results to include type and screen dated 29 November 2011 reviewed.  Abnormal pre-op EKG dated 29 November 2011 noted. This patient sustained a STEMI on 12 May 2011. A cardiac cath was performed and a DES was deployed to a totally occluded RCA. He subsequently had successful PCI of the mid LAD dilated by FFR performed on 01 June 2011. He has had routine cardiac follow-up and has been participating in cardiac rehab. He was last seen by Dr. Charlton Haws (cardiology0 on 15 November 2011 and cleared to proceed with surgery.  When seen for his pre-anesthesia evaluation on 29 November 2011 in preparation for surgery Monday 03 December 2011, he related that he had been experiencing right upper extremity pain which he forgot to tell Dr. Eden Emms about when seen on 08 August. I attempted to discuss this further with the patient while he was in short stay. He stated that the "answer to all your questions is yes."  He was not interested in discussing the matter further. I contacted Dr. Fabio Bering office to report this symptom. Dr. Eden Emms is aware.  May proceed with surgery as scheduled .  Kelton Pillar. Everhart, PA-C

## 2011-12-02 MED ORDER — DEXTROSE 5 % IV SOLN
1.5000 g | INTRAVENOUS | Status: AC
  Administered 2011-12-03: 1.5 g via INTRAVENOUS
  Filled 2011-12-02: qty 1.5

## 2011-12-03 ENCOUNTER — Encounter (HOSPITAL_COMMUNITY)
Admission: RE | Disposition: A | Discharge status: Home / Self Care | Payer: Self-pay | Source: Ambulatory Visit | Attending: Thoracic Surgery (Cardiothoracic Vascular Surgery)

## 2011-12-03 ENCOUNTER — Ambulatory Visit (HOSPITAL_COMMUNITY): Payer: Medicare Other

## 2011-12-03 ENCOUNTER — Encounter (HOSPITAL_COMMUNITY): Payer: Self-pay | Admitting: *Deleted

## 2011-12-03 ENCOUNTER — Inpatient Hospital Stay (HOSPITAL_COMMUNITY)
Admission: RE | Admit: 2011-12-03 | Discharge: 2011-12-06 | DRG: 165 | Disposition: A | Discharge status: Home / Self Care | Payer: Medicare Other | Source: Ambulatory Visit | Attending: Thoracic Surgery (Cardiothoracic Vascular Surgery) | Admitting: Thoracic Surgery (Cardiothoracic Vascular Surgery)

## 2011-12-03 ENCOUNTER — Encounter (HOSPITAL_COMMUNITY): Payer: Self-pay | Admitting: Anesthesiology

## 2011-12-03 ENCOUNTER — Ambulatory Visit (HOSPITAL_COMMUNITY): Payer: Medicare Other | Admitting: Anesthesiology

## 2011-12-03 DIAGNOSIS — F3289 Other specified depressive episodes: Secondary | ICD-10-CM | POA: Diagnosis present

## 2011-12-03 DIAGNOSIS — F329 Major depressive disorder, single episode, unspecified: Secondary | ICD-10-CM | POA: Diagnosis present

## 2011-12-03 DIAGNOSIS — Z01812 Encounter for preprocedural laboratory examination: Secondary | ICD-10-CM

## 2011-12-03 DIAGNOSIS — D381 Neoplasm of uncertain behavior of trachea, bronchus and lung: Secondary | ICD-10-CM

## 2011-12-03 DIAGNOSIS — Z87891 Personal history of nicotine dependence: Secondary | ICD-10-CM

## 2011-12-03 DIAGNOSIS — I2510 Atherosclerotic heart disease of native coronary artery without angina pectoris: Secondary | ICD-10-CM | POA: Diagnosis present

## 2011-12-03 DIAGNOSIS — I252 Old myocardial infarction: Secondary | ICD-10-CM

## 2011-12-03 DIAGNOSIS — R918 Other nonspecific abnormal finding of lung field: Principal | ICD-10-CM | POA: Diagnosis present

## 2011-12-03 DIAGNOSIS — F411 Generalized anxiety disorder: Secondary | ICD-10-CM | POA: Diagnosis present

## 2011-12-03 DIAGNOSIS — Z79899 Other long term (current) drug therapy: Secondary | ICD-10-CM

## 2011-12-03 DIAGNOSIS — Z01810 Encounter for preprocedural cardiovascular examination: Secondary | ICD-10-CM

## 2011-12-03 DIAGNOSIS — Z9861 Coronary angioplasty status: Secondary | ICD-10-CM

## 2011-12-03 DIAGNOSIS — Z7982 Long term (current) use of aspirin: Secondary | ICD-10-CM

## 2011-12-03 DIAGNOSIS — E785 Hyperlipidemia, unspecified: Secondary | ICD-10-CM | POA: Diagnosis present

## 2011-12-03 LAB — TYPE AND SCREEN

## 2011-12-03 SURGERY — VIDEO ASSISTED THORACOSCOPY (VATS)/WEDGE RESECTION
Anesthesia: General | Site: Chest | Laterality: Right | Wound class: Clean Contaminated

## 2011-12-03 MED ORDER — GABAPENTIN 100 MG PO CAPS
100.0000 mg | ORAL_CAPSULE | Freq: Four times a day (QID) | ORAL | Status: DC
  Administered 2011-12-03 – 2011-12-05 (×7): 100 mg via ORAL
  Filled 2011-12-03 (×13): qty 1

## 2011-12-03 MED ORDER — EPHEDRINE SULFATE 50 MG/ML IJ SOLN
INTRAMUSCULAR | Status: DC | PRN
  Administered 2011-12-03 (×2): 10 mg via INTRAVENOUS

## 2011-12-03 MED ORDER — OXYCODONE-ACETAMINOPHEN 5-325 MG PO TABS
1.0000 | ORAL_TABLET | ORAL | Status: DC | PRN
  Administered 2011-12-04: 2 via ORAL
  Administered 2011-12-05: 1 via ORAL
  Administered 2011-12-06: 2 via ORAL
  Filled 2011-12-03: qty 1
  Filled 2011-12-03 (×2): qty 2

## 2011-12-03 MED ORDER — ACETAMINOPHEN 10 MG/ML IV SOLN
1000.0000 mg | Freq: Four times a day (QID) | INTRAVENOUS | Status: AC
  Administered 2011-12-03 – 2011-12-04 (×4): 1000 mg via INTRAVENOUS
  Filled 2011-12-03 (×3): qty 100

## 2011-12-03 MED ORDER — NALOXONE HCL 0.4 MG/ML IJ SOLN: 0.4000 mg | INTRAMUSCULAR | Status: DC | PRN

## 2011-12-03 MED ORDER — METOPROLOL TARTRATE 12.5 MG HALF TABLET
12.5000 mg | ORAL_TABLET | Freq: Two times a day (BID) | ORAL | Status: DC
  Administered 2011-12-04 – 2011-12-05 (×4): 12.5 mg via ORAL
  Filled 2011-12-03 (×6): qty 1

## 2011-12-03 MED ORDER — FENTANYL CITRATE 0.05 MG/ML IJ SOLN
INTRAMUSCULAR | Status: DC | PRN
  Administered 2011-12-03: 50 ug via INTRAVENOUS
  Administered 2011-12-03 (×4): 100 ug via INTRAVENOUS
  Administered 2011-12-03: 50 ug via INTRAVENOUS

## 2011-12-03 MED ORDER — HYDROMORPHONE HCL PF 1 MG/ML IJ SOLN
0.2500 mg | INTRAMUSCULAR | Status: DC | PRN
  Administered 2011-12-03 (×4): 0.5 mg via INTRAVENOUS

## 2011-12-03 MED ORDER — LIDOCAINE HCL (CARDIAC) 20 MG/ML IV SOLN
INTRAVENOUS | Status: DC | PRN
  Administered 2011-12-03: 80 mg via INTRAVENOUS

## 2011-12-03 MED ORDER — DIPHENHYDRAMINE HCL 50 MG/ML IJ SOLN
12.5000 mg | Freq: Four times a day (QID) | INTRAMUSCULAR | Status: DC | PRN
  Administered 2011-12-05: 12.5 mg via INTRAVENOUS
  Filled 2011-12-03: qty 1

## 2011-12-03 MED ORDER — NEOSTIGMINE METHYLSULFATE 1 MG/ML IJ SOLN
INTRAMUSCULAR | Status: DC | PRN
  Administered 2011-12-03: 5 mg via INTRAVENOUS

## 2011-12-03 MED ORDER — ONDANSETRON HCL 4 MG/2ML IJ SOLN: 4.0000 mg | Freq: Once | INTRAMUSCULAR | Status: DC | PRN

## 2011-12-03 MED ORDER — DIPHENHYDRAMINE HCL 12.5 MG/5ML PO ELIX
12.5000 mg | ORAL_SOLUTION | Freq: Four times a day (QID) | ORAL | Status: DC | PRN
  Filled 2011-12-03: qty 5

## 2011-12-03 MED ORDER — ONDANSETRON HCL 4 MG/2ML IJ SOLN: 4.0000 mg | Freq: Four times a day (QID) | INTRAMUSCULAR | Status: DC | PRN

## 2011-12-03 MED ORDER — HEMOSTATIC AGENTS (NO CHARGE) OPTIME
TOPICAL | Status: DC | PRN
  Administered 2011-12-03: 1 via TOPICAL

## 2011-12-03 MED ORDER — BISACODYL 5 MG PO TBEC
10.0000 mg | DELAYED_RELEASE_TABLET | Freq: Every day | ORAL | Status: DC
  Filled 2011-12-03 (×2): qty 2

## 2011-12-03 MED ORDER — FENTANYL 10 MCG/ML IV SOLN
INTRAVENOUS | Status: DC
  Administered 2011-12-03: 19:00:00 via INTRAVENOUS
  Administered 2011-12-03: 50 ug via INTRAVENOUS
  Administered 2011-12-04: 190 ug via INTRAVENOUS
  Administered 2011-12-04: 10 ug via INTRAVENOUS
  Filled 2011-12-03 (×2): qty 50

## 2011-12-03 MED ORDER — ROCURONIUM BROMIDE 100 MG/10ML IV SOLN
INTRAVENOUS | Status: DC | PRN
  Administered 2011-12-03: 50 mg via INTRAVENOUS
  Administered 2011-12-03: 20 mg via INTRAVENOUS

## 2011-12-03 MED ORDER — LACTATED RINGERS IV SOLN
INTRAVENOUS | Status: DC | PRN
  Administered 2011-12-03: 15:00:00 via INTRAVENOUS

## 2011-12-03 MED ORDER — FENTANYL CITRATE 0.05 MG/ML IJ SOLN
INTRAMUSCULAR | Status: AC
  Filled 2011-12-03: qty 2

## 2011-12-03 MED ORDER — ACETAMINOPHEN 10 MG/ML IV SOLN
INTRAVENOUS | Status: AC
  Filled 2011-12-03: qty 100

## 2011-12-03 MED ORDER — ONDANSETRON HCL 4 MG/2ML IJ SOLN
INTRAMUSCULAR | Status: DC | PRN
  Administered 2011-12-03: 4 mg via INTRAVENOUS

## 2011-12-03 MED ORDER — POTASSIUM CHLORIDE 10 MEQ/50ML IV SOLN: 10.0000 meq | Freq: Every day | INTRAVENOUS | Status: DC | PRN

## 2011-12-03 MED ORDER — CLONAZEPAM 0.5 MG PO TABS
0.5000 mg | ORAL_TABLET | Freq: Two times a day (BID) | ORAL | Status: DC | PRN
  Administered 2011-12-03 – 2011-12-05 (×3): 0.5 mg via ORAL
  Filled 2011-12-03 (×3): qty 1

## 2011-12-03 MED ORDER — VILAZODONE HCL 40 MG PO TABS
40.0000 mg | ORAL_TABLET | Freq: Every day | ORAL | Status: DC
  Administered 2011-12-05 (×2): 40 mg via ORAL
  Filled 2011-12-03 (×4): qty 1

## 2011-12-03 MED ORDER — OXYCODONE HCL 5 MG PO TABS
5.0000 mg | ORAL_TABLET | ORAL | Status: AC | PRN
  Administered 2011-12-04: 5 mg via ORAL
  Administered 2011-12-04: 10 mg via ORAL
  Administered 2011-12-04: 5 mg via ORAL
  Filled 2011-12-03 (×2): qty 1
  Filled 2011-12-03: qty 2

## 2011-12-03 MED ORDER — GLYCOPYRROLATE 0.2 MG/ML IJ SOLN
INTRAMUSCULAR | Status: DC | PRN
  Administered 2011-12-03: 0.6 mg via INTRAVENOUS

## 2011-12-03 MED ORDER — SENNOSIDES-DOCUSATE SODIUM 8.6-50 MG PO TABS
1.0000 | ORAL_TABLET | Freq: Every evening | ORAL | Status: DC | PRN
  Filled 2011-12-03: qty 1

## 2011-12-03 MED ORDER — SODIUM CHLORIDE 0.9 % IJ SOLN: 9.0000 mL | INTRAMUSCULAR | Status: DC | PRN

## 2011-12-03 MED ORDER — HYDROMORPHONE HCL PF 1 MG/ML IJ SOLN
INTRAMUSCULAR | Status: AC
  Filled 2011-12-03: qty 1

## 2011-12-03 MED ORDER — FENTANYL CITRATE 0.05 MG/ML IJ SOLN
50.0000 ug | INTRAMUSCULAR | Status: DC | PRN
  Administered 2011-12-03: 50 ug via INTRAVENOUS

## 2011-12-03 MED ORDER — PANTOPRAZOLE SODIUM 40 MG PO TBEC
40.0000 mg | DELAYED_RELEASE_TABLET | Freq: Every day | ORAL | Status: DC
  Administered 2011-12-04: 40 mg via ORAL
  Filled 2011-12-03: qty 1

## 2011-12-03 MED ORDER — MIDAZOLAM HCL 2 MG/2ML IJ SOLN
1.0000 mg | INTRAMUSCULAR | Status: DC | PRN
  Administered 2011-12-03 (×2): 1 mg via INTRAVENOUS

## 2011-12-03 MED ORDER — DEXTROSE-NACL 5-0.9 % IV SOLN
INTRAVENOUS | Status: DC
  Administered 2011-12-04: 10:00:00 via INTRAVENOUS
  Administered 2011-12-04: 125 mL/h via INTRAVENOUS

## 2011-12-03 MED ORDER — 0.9 % SODIUM CHLORIDE (POUR BTL) OPTIME
TOPICAL | Status: DC | PRN
  Administered 2011-12-03: 2000 mL

## 2011-12-03 MED ORDER — PRASUGREL HCL 10 MG PO TABS
10.0000 mg | ORAL_TABLET | Freq: Every day | ORAL | Status: DC
  Administered 2011-12-04 – 2011-12-05 (×2): 10 mg via ORAL
  Filled 2011-12-03 (×3): qty 1

## 2011-12-03 MED ORDER — ROSUVASTATIN CALCIUM 10 MG PO TABS
10.0000 mg | ORAL_TABLET | Freq: Every day | ORAL | Status: DC
  Filled 2011-12-03 (×4): qty 1

## 2011-12-03 MED ORDER — ASPIRIN 81 MG PO CHEW
81.0000 mg | CHEWABLE_TABLET | Freq: Every day | ORAL | Status: DC
  Administered 2011-12-04 – 2011-12-05 (×2): 81 mg via ORAL
  Filled 2011-12-03 (×2): qty 1

## 2011-12-03 MED ORDER — PROPOFOL 10 MG/ML IV BOLUS
INTRAVENOUS | Status: DC | PRN
  Administered 2011-12-03: 70 mg via INTRAVENOUS
  Administered 2011-12-03: 200 mg via INTRAVENOUS

## 2011-12-03 MED ORDER — LACTATED RINGERS IV SOLN
INTRAVENOUS | Status: DC
  Administered 2011-12-03 (×2): via INTRAVENOUS

## 2011-12-03 MED ORDER — NON FORMULARY: 10.0000 mg | Freq: Every day | Status: DC

## 2011-12-03 MED ORDER — ASPIRIN 81 MG PO TABS: 81.0000 mg | ORAL_TABLET | Freq: Every day | ORAL | Status: DC

## 2011-12-03 MED ORDER — PHENYLEPHRINE HCL 10 MG/ML IJ SOLN
10.0000 mg | INTRAVENOUS | Status: DC | PRN
  Administered 2011-12-03: 1 ug/min via INTRAVENOUS

## 2011-12-03 MED ORDER — DEXTROSE 5 % IV SOLN
1.5000 g | Freq: Two times a day (BID) | INTRAVENOUS | Status: AC
  Administered 2011-12-03 – 2011-12-04 (×2): 1.5 g via INTRAVENOUS
  Filled 2011-12-03 (×2): qty 1.5

## 2011-12-03 MED ORDER — LACTATED RINGERS IV SOLN
INTRAVENOUS | Status: DC | PRN
  Administered 2011-12-03 (×2): via INTRAVENOUS

## 2011-12-03 MED ORDER — TRAMADOL HCL 50 MG PO TABS: 50.0000 mg | ORAL_TABLET | Freq: Four times a day (QID) | ORAL | Status: DC | PRN

## 2011-12-03 MED ORDER — MIDAZOLAM HCL 2 MG/2ML IJ SOLN
INTRAMUSCULAR | Status: AC
  Filled 2011-12-03: qty 2

## 2011-12-03 SURGICAL SUPPLY — 70 items
ADH SKN CLS APL DERMABOND .7 (GAUZE/BANDAGES/DRESSINGS) ×1
CANISTER SUCTION 2500CC (MISCELLANEOUS) ×4 IMPLANT
CATH KIT ON Q 5IN SLV (PAIN MANAGEMENT) IMPLANT
CATH THORACIC 28FR (CATHETERS) ×1 IMPLANT
CATH THORACIC 36FR (CATHETERS) IMPLANT
CATH THORACIC 36FR RT ANG (CATHETERS) IMPLANT
CLIP TI MEDIUM 6 (CLIP) ×2 IMPLANT
CLOTH BEACON ORANGE TIMEOUT ST (SAFETY) ×2 IMPLANT
CONT SPEC 4OZ CLIKSEAL STRL BL (MISCELLANEOUS) ×4 IMPLANT
DERMABOND ADVANCED (GAUZE/BANDAGES/DRESSINGS) ×1
DERMABOND ADVANCED .7 DNX12 (GAUZE/BANDAGES/DRESSINGS) IMPLANT
DRAIN CHANNEL 28F RND 3/8 FF (WOUND CARE) IMPLANT
DRAIN CHANNEL 32F RND 10.7 FF (WOUND CARE) IMPLANT
DRAPE LAPAROSCOPIC ABDOMINAL (DRAPES) ×2 IMPLANT
DRAPE WARM FLUID 44X44 (DRAPE) ×2 IMPLANT
ELECT REM PT RETURN 9FT ADLT (ELECTROSURGICAL) ×2
ELECTRODE REM PT RTRN 9FT ADLT (ELECTROSURGICAL) ×1 IMPLANT
GLOVE BIO SURGEON STRL SZ 6.5 (GLOVE) ×2 IMPLANT
GLOVE BIOGEL PI IND STRL 7.0 (GLOVE) IMPLANT
GLOVE BIOGEL PI INDICATOR 7.0 (GLOVE) ×2
GLOVE EUDERMIC 7 POWDERFREE (GLOVE) ×4 IMPLANT
GOWN PREVENTION PLUS XLARGE (GOWN DISPOSABLE) ×4 IMPLANT
GOWN STRL NON-REIN LRG LVL3 (GOWN DISPOSABLE) ×4 IMPLANT
HANDLE STAPLE ENDO GIA SHORT (STAPLE) ×1
HEMOSTAT SURGICEL 2X14 (HEMOSTASIS) ×1 IMPLANT
KIT BASIN OR (CUSTOM PROCEDURE TRAY) ×2 IMPLANT
KIT ROOM TURNOVER OR (KITS) ×2 IMPLANT
KIT SUCTION CATH 14FR (SUCTIONS) ×2 IMPLANT
NS IRRIG 1000ML POUR BTL (IV SOLUTION) ×4 IMPLANT
PACK CHEST (CUSTOM PROCEDURE TRAY) ×2 IMPLANT
PAD ARMBOARD 7.5X6 YLW CONV (MISCELLANEOUS) ×4 IMPLANT
POUCH ENDO CATCH II 15MM (MISCELLANEOUS) ×1 IMPLANT
RELOAD EGIA 45 MED/THCK PURPLE (STAPLE) ×4 IMPLANT
SEALANT PROGEL (MISCELLANEOUS) ×1 IMPLANT
SEALANT SURG COSEAL 4ML (VASCULAR PRODUCTS) IMPLANT
SEALANT SURG COSEAL 8ML (VASCULAR PRODUCTS) IMPLANT
SOLUTION ANTI FOG 6CC (MISCELLANEOUS) ×3 IMPLANT
SPECIMEN JAR MEDIUM (MISCELLANEOUS) ×1 IMPLANT
SPONGE GAUZE 4X4 12PLY (GAUZE/BANDAGES/DRESSINGS) ×2 IMPLANT
STAPLER ENDO GIA 12 SHRT THIN (STAPLE) IMPLANT
STAPLER ENDO GIA 12MM SHORT (STAPLE) ×1 IMPLANT
SUT PROLENE 4 0 RB 1 (SUTURE)
SUT PROLENE 4-0 RB1 .5 CRCL 36 (SUTURE) IMPLANT
SUT SILK  1 MH (SUTURE) ×1
SUT SILK 1 MH (SUTURE) ×1 IMPLANT
SUT SILK 2 0SH CR/8 30 (SUTURE) IMPLANT
SUT SILK 3 0SH CR/8 30 (SUTURE) IMPLANT
SUT VIC AB 0 CTX 27 (SUTURE) IMPLANT
SUT VIC AB 1 CTX 27 (SUTURE) ×1 IMPLANT
SUT VIC AB 2-0 CT1 27 (SUTURE)
SUT VIC AB 2-0 CT1 TAPERPNT 27 (SUTURE) IMPLANT
SUT VIC AB 2-0 CTX 36 (SUTURE) ×1 IMPLANT
SUT VIC AB 2-0 UR6 27 (SUTURE) ×1 IMPLANT
SUT VIC AB 3-0 MH 27 (SUTURE) IMPLANT
SUT VIC AB 3-0 SH 27 (SUTURE)
SUT VIC AB 3-0 SH 27X BRD (SUTURE) IMPLANT
SUT VIC AB 3-0 X1 27 (SUTURE) ×4 IMPLANT
SUT VICRYL 0 UR6 27IN ABS (SUTURE) ×4 IMPLANT
SUT VICRYL 2 TP 1 (SUTURE) IMPLANT
SWAB COLLECTION DEVICE MRSA (MISCELLANEOUS) IMPLANT
SYSTEM SAHARA CHEST DRAIN ATS (WOUND CARE) ×2 IMPLANT
TAPE CLOTH SURG 4X10 WHT LF (GAUZE/BANDAGES/DRESSINGS) ×1 IMPLANT
TIP APPLICATOR SPRAY EXTEND 16 (VASCULAR PRODUCTS) IMPLANT
TOWEL OR 17X24 6PK STRL BLUE (TOWEL DISPOSABLE) ×2 IMPLANT
TOWEL OR 17X26 10 PK STRL BLUE (TOWEL DISPOSABLE) ×4 IMPLANT
TRAP SPECIMEN MUCOUS 40CC (MISCELLANEOUS) IMPLANT
TRAY FOLEY CATH 14FR (SET/KITS/TRAYS/PACK) ×2 IMPLANT
TUBE ANAEROBIC SPECIMEN COL (MISCELLANEOUS) IMPLANT
TUNNELER SHEATH ON-Q 11GX8 (MISCELLANEOUS) ×1 IMPLANT
WATER STERILE IRR 1000ML POUR (IV SOLUTION) ×4 IMPLANT

## 2011-12-03 NOTE — Progress Notes (Signed)
Patient still agitated.  Continually asks to get out of bed so he can "take a leak."  Patient is reassured continually that he has a catheter that will catch his urine.  PRN Klonopin 0.5 mg given.  Will continue to monitor.    Vivi Martens RN

## 2011-12-03 NOTE — Anesthesia Postprocedure Evaluation (Signed)
  Anesthesia Post-op Note  Patient: Tyler Mayo  Procedure(s) Performed: Procedure(s) (LRB): VIDEO ASSISTED THORACOSCOPY (VATS)/WEDGE RESECTION (Right)  Patient Location: PACU  Anesthesia Type: General  Level of Consciousness: awake, alert  and oriented  Airway and Oxygen Therapy: Patient Spontanous Breathing and Patient connected to nasal cannula oxygen  Post-op Pain: moderate  Post-op Assessment: Post-op Vital signs reviewed  Post-op Vital Signs: Reviewed  Complications: No apparent anesthesia complications

## 2011-12-03 NOTE — Brief Op Note (Addendum)
12/03/2011  5:03 PM  PATIENT:  Tyler Mayo  71 y.o. male  PRE-OPERATIVE DIAGNOSIS:  Lung nodules (left upper lobe and right middel lobe)  POST-OPERATIVE DIAGNOSIS:   Lung nodules (left upper lobe and right middel lobe)   PROCEDURE:  RIGHT VIDEO ASSISTED THORACOSCOPY (VATS)/ WEDGE RESECTION RIGHT MIDDLE LOBE  SURGEON:  Surgeon(s) and Role:    * Loreli Slot, MD - Primary  PHYSICIAN ASSISTANT: Doree Fudge PA-C   ANESTHESIA:   general  EBL:  Total I/O In: 2000 [I.V.:2000] Out: 275 [Urine:275]  BLOOD ADMINISTERED:none  DRAINS: One Chest Tube(s) in the right pleural space  }  SPECIMEN:  Source of Specimen:  Wedge resection of RML  DISPOSITION OF SPECIMEN:  PATHOLOGY.Frozen section showed no carcinoma and margins are negative.  COUNTS CORRECT:  YES  DICTATION: .Dragon Dictation  PLAN OF CARE: Admit to inpatient   PATIENT DISPOSITION:  PACU - hemodynamically stable.   Delay start of Pharmacological VTE agent (>24hrs) due to surgical blood loss or risk of bleeding: yes

## 2011-12-03 NOTE — Progress Notes (Signed)
Patient more awake now; has been agitated.  Trying to climb out of bed; pulled at chest tube.  Patient is oriented, just agitated.  Wife stated that patient was "like this when he was here before."  Patient provided education on safety and why it is important to not pull at chords and tubes or get out of bed without assistance.  Seems slightly calmer now.  Will continue to monitor.   Vivi Martens RN

## 2011-12-03 NOTE — Transfer of Care (Signed)
Immediate Anesthesia Transfer of Care Note  Patient: Tyler Mayo  Procedure(s) Performed: Procedure(s) (LRB): VIDEO ASSISTED THORACOSCOPY (VATS)/WEDGE RESECTION (Right)  Patient Location: PACU  Anesthesia Type: General  Level of Consciousness: awake, alert  and oriented  Airway & Oxygen Therapy: Patient Spontanous Breathing and Patient connected to face mask oxygen  Post-op Assessment: Report given to PACU RN and Post -op Vital signs reviewed and stable  Post vital signs: Reviewed and stable  Complications: No apparent anesthesia complications

## 2011-12-03 NOTE — Anesthesia Preprocedure Evaluation (Signed)
Anesthesia Evaluation  Patient identified by MRN, date of birth, ID band Patient awake    Reviewed: Allergy & Precautions, H&P , NPO status , Patient's Chart, lab work & pertinent test results  Airway Mallampati: I TM Distance: >3 FB Neck ROM: Full    Dental  (+) Teeth Intact and Dental Advisory Given   Pulmonary  breath sounds clear to auscultation        Cardiovascular + CAD and + Past MI Rhythm:Regular Rate:Normal     Neuro/Psych    GI/Hepatic   Endo/Other    Renal/GU      Musculoskeletal   Abdominal   Peds  Hematology   Anesthesia Other Findings   Reproductive/Obstetrics                           Anesthesia Physical Anesthesia Plan  ASA: III  Anesthesia Plan: General   Post-op Pain Management:    Induction: Intravenous  Airway Management Planned: Double Lumen EBT  Additional Equipment: Arterial line and CVP  Intra-op Plan:   Post-operative Plan: Extubation in OR  Informed Consent: I have reviewed the patients History and Physical, chart, labs and discussed the procedure including the risks, benefits and alternatives for the proposed anesthesia with the patient or authorized representative who has indicated his/her understanding and acceptance.   Dental advisory given  Plan Discussed with: CRNA, Surgeon and Anesthesiologist  Anesthesia Plan Comments:         Anesthesia Quick Evaluation

## 2011-12-03 NOTE — Addendum Note (Signed)
Addendum  created 12/03/11 1740 by Avelynn Sellin A Anthonee Gelin, MD   Modules edited:Anesthesia Attestations    

## 2011-12-03 NOTE — H&P (Signed)
HPI:  Tyler Mayo returns today for followup of his right middle lobe and left upper lobe lung nodules. These were first found over a year ago. He was worked up by Tyler Mayo. He had biopsies done which showed inflammatory changes but no evidence of malignancy. Tyler Mayo continue to follow him with serial CT scans. He recently underwent a rheumatologic workup but says that nothing was found. He returns for a scheduled CT scan to followup these nodules..  He states that his been feeling well. He's not had any cardiac issues. He has an appointment with Tyler Mayo later this week. He's not had any cough, hemoptysis, fevers or chills.  Past Medical History   Diagnosis  Date   .  Lung nodules      s/p bx with Tyler Mayo 9/12   .  Depression with anxiety    .  Erectile dysfunction    .  CAD (coronary artery disease)      a. 8.2012 neg MV; b. 05/12/2011 Inf STEMI - 100% RCA - 3.5x22 Resolute DES, Residual 80% LAD dzs. c. s/p planned DES to LAD 06/01/11.   Marland Kitchen  Hyperlipidemia    .  History of tobacco abuse     Current Outpatient Prescriptions   Medication  Sig  Dispense  Refill   .  aspirin 81 MG tablet  Take 81 mg by mouth daily.     .  clonazePAM (KLONOPIN) 0.5 MG tablet  Take 0.5 mg by mouth 2 (two) times daily as needed. For anxiety     .  gabapentin (NEURONTIN) 100 MG capsule  Take 100 mg by mouth 4 (four) times daily. Dose is being titrated up by Dr. Prince Rome. Patient reports currently taking 4 capsules each day (06/2011)     .  HYDROcodone-acetaminophen (NORCO) 5-325 MG per tablet  Take 1 tablet by mouth every 4 (four) hours as needed. For pain     .  metoprolol tartrate (LOPRESSOR) 25 MG tablet  Take 12.5 mg by mouth 2 (two) times daily.     .  nitroGLYCERIN (NITROSTAT) 0.4 MG SL tablet  Place 0.4 mg under the tongue every 5 (five) minutes as needed. For chest pain     .  prasugrel (EFFIENT) 10 MG TABS  Take 10 mg by mouth daily.     .  rosuvastatin (CRESTOR) 10 MG tablet  Take 1 tablet (10 mg total)  by mouth at bedtime.  30 tablet  11   .  VIIBRYD 10 MG TABS  Take 1 tablet by mouth Daily.     Physical Exam  BP 110/75  Pulse 75  Resp 18  Ht 5\' 10"  (1.778 m)  Wt 226 lb (102.513 kg)  BMI 32.43 kg/m2  SpO27 51%  71 year old male in no acute distress  No cervical or subclavicular adenopathy  Lungs clear equal breath sounds  Cardiac regular rate and rhythm normal S1 and S2  Diagnostic Tests:  CT of chest 11/13/2011  Showed no significant change in the left upper lobe nodule, which the radiologist feels is suspicious for low-grade adenocarcinoma. There has been a slight increase in the size of the cavitary right middle lobe nodule.  Impression:  71 year old gentleman with bilateral lung nodules. These have been present for over year now. He has had biopsies in the past which showed evidence of inflammation. And more than likely these are inflammatory masses. However his rheumatologic workup was negative and the nodules of continued to slowly increase in size, at least  the right middle lobe nodule. I had a long discussion with Tyler Mayo regarding these nodules and their management. At this point, with the increase in size the right middle lobe nodule, I recommend proceeding with a right vats and wedge resection of the right middle lobe nodule for definitive diagnosis, and possible lobectomy depending on findings. Again this more likely would be inflammatory. However I cannot rule out the possibility that there is a malignant component. Another option is to repeat a biopsy. Unfortunately, additional biopsies via bronchoscopy or with CT guidance cannot definitively rule out the possibility of cancer either. I think the yield from either of those approaches is going to be low. Finally the other option would be to continue to follow this with CT scans.  Plan:  Tyler Mayo does not want to pursue surgery an excisional biopsy at this point in time and wishes to be followed with serial CT scans.    I will plan to see him back in 3 months    After further discussion Tyler Mayo has decided to proceed with right VATS, wedge resection for excisional biopsy of the RML nodule and possible right middle lobectomy if the lesion is lung cancer. He understands the nature of the procedure, need for general anesthesia and potential short and long term outcomes. He understands the risks include but are not limited to death, stroke, MI, DVT, PE, bleeding, possible need for transfusion, infection, air leak, cardiac arrhythmias, as well as other unforeseeable complications. He wishes to proceed. He has been off Effient for 5 days.

## 2011-12-03 NOTE — Interval H&P Note (Signed)
History and Physical Interval Note:  12/03/2011 2:49 PM  Page Spiro  has presented today for surgery, with the diagnosis of lung nodule  The various methods of treatment have been discussed with the patient and family. After consideration of risks, benefits and other options for treatment, the patient has consented to  Procedure(s) (LRB): VIDEO ASSISTED THORACOSCOPY (VATS)/WEDGE RESECTION (Right) as a surgical intervention .  The patient's history has been reviewed, patient examined, no change in status, stable for surgery.  I have reviewed the patient's chart and labs.  Questions were answered to the patient's satisfaction.     Tyler Mayo

## 2011-12-03 NOTE — Preoperative (Signed)
Beta Blockers   Reason not to administer Beta Blockers:Not Applicable 

## 2011-12-03 NOTE — Progress Notes (Signed)
Patient arrived at shift change; vss; Patient alert and oriented to self and situation.  When asked to rate his pain states he is "thinking about it" but will not give a number.  Patient educated on PCA.  Will continue to monitor.  Vivi Martens RN

## 2011-12-03 NOTE — Addendum Note (Signed)
Addendum  created 12/03/11 1740 by Kerby Nora, MD   Modules edited:Anesthesia Attestations

## 2011-12-03 NOTE — OR Nursing (Signed)
Progel 4mL Expiration date 04/09/2013 used intraoperatively by Dr. [name].

## 2011-12-04 ENCOUNTER — Inpatient Hospital Stay (HOSPITAL_COMMUNITY): Payer: Medicare Other

## 2011-12-04 LAB — BLOOD GAS, ARTERIAL
Acid-base deficit: 0.5 mmol/L (ref 0.0–2.0)
Bicarbonate: 24.1 mEq/L — ABNORMAL HIGH (ref 20.0–24.0)
O2 Content: 2 L/min
O2 Saturation: 97.8 %
Patient temperature: 98.6
pO2, Arterial: 96.5 mmHg (ref 80.0–100.0)

## 2011-12-04 LAB — CBC
Hemoglobin: 12.6 g/dL — ABNORMAL LOW (ref 13.0–17.0)
MCH: 31.3 pg (ref 26.0–34.0)
MCHC: 33.2 g/dL (ref 30.0–36.0)
RDW: 14.1 % (ref 11.5–15.5)

## 2011-12-04 LAB — BASIC METABOLIC PANEL
CO2: 26 mEq/L (ref 19–32)
Calcium: 8.7 mg/dL (ref 8.4–10.5)
Creatinine, Ser: 0.93 mg/dL (ref 0.50–1.35)
GFR calc non Af Amer: 84 mL/min — ABNORMAL LOW (ref >90)
Glucose, Bld: 141 mg/dL — ABNORMAL HIGH (ref 70–99)

## 2011-12-04 MED ORDER — SODIUM CHLORIDE 0.9 % IJ SOLN
INTRAMUSCULAR | Status: AC
  Administered 2011-12-04: 10 mL
  Filled 2011-12-04: qty 20

## 2011-12-04 MED ORDER — WHITE PETROLATUM GEL
Status: AC
  Administered 2011-12-04: 01:00:00
  Filled 2011-12-04: qty 5

## 2011-12-04 MED ORDER — SODIUM CHLORIDE 0.9 % IJ SOLN
INTRAMUSCULAR | Status: AC
  Filled 2011-12-04: qty 40

## 2011-12-04 NOTE — Op Note (Signed)
NAME:  Tyler Mayo, Tyler Mayo NO.:  [id number]  MEDICAL RECORD NO.:  [id number]  LOCATION:  3309                         FACILITY:  [facility]  PHYSICIAN:  Salvatore Decent. [name], M.D.DATE OF BIRTH:  12-05-1940  DATE OF PROCEDURE:  12/03/2011 DATE OF DISCHARGE:                              OPERATIVE REPORT   PREOPERATIVE DIAGNOSIS:  Bilateral lung nodules, right middle lobe and left upper lobe.  POSTOPERATIVE DIAGNOSIS:  Bilateral lung nodules, right middle lobe and left upper lobe.  PROCEDURE:  Right video-assisted thoracoscopy, wedge resection of right middle lobe nodule.  SURGEON:  Salvatore Decent. [name], MD  ASSISTANT:  Doree Fudge, PA  ANESTHESIA:  General.  FINDINGS:  Approximately 2 cm relatively soft nodule in the anterior aspect of the right middle lobe.  Frozen section revealed inflammation. No carcinoma was seen.  Unusual malignancy such as  lymphoma could not be ruled out on frozen section.  CLINICAL NOTE:  Tyler Mayo is a 72 year old gentleman  who has been followed for right middle lobe and left upper lobe lung nodules that had been found over a year ago.  Biopsy had shown inflammatory changes but no evidence of malignancy.  These continued to be followed and had slowly increased in size.  He had been advised to have an excisional biopsy but previously had refused to do so.  I saw him in the office several weeks ago, and he still at that time was refusing to have surgery and wished to be followed radiologically.  However, he had a change of heart and wished to proceed with surgical resection.  He did understand the right middle lobe will be approached as it was the larger of the lesions and also the most concerning in terms of appearance.  He understood the indications, risks, benefits, and alternatives.  He understood the risks of surgery included, but were not limited to, death, MI, stroke, DVT, PE, bleeding, possible need for transfusion,  infection, air leak, cardiac arrhythmias, as well as other unforeseeable com plications.  He understood and accepted the risks and wished to proceed.  OPERATIVE NOTE:  Tyler Mayo was brought to the preoperative holding area on December 03, 2011.  There, the Anesthesia Service placed a central line and arterial blood pressure monitoring line.  Intravenous antibiotics were administered.  He was taken to the operating room, anesthetized, and intubated with a double-lumen endotracheal tube.  A Foley catheter was placed.  He was placed in a left lateral decubitus position, and the right chest was prepped and draped in usual sterile fashion.  Single lung ventilation of the left lung was carried out and was tolerated well throughout the procedure.  An incision was made in the midaxillary line at approximately the seventh intercostal space.  This was carried through the skin and subcutaneous tissue.  The chest entered bluntly using a hemostat.  A port was inserted and a thoracoscope was placed into the chest.  An additional port incision was made below the tip of the scapula posteriorly and a final port incision which was slightly larger about 4 cm in length was made anteriorly approximately the fifth intercostal space.  After making the incision, the lung was grasped and  the nodule was clearly palpable in the anterior aspect of the middle lobe, although the nodule was relatively soft.  The port and the scope then were moved to the posterior incision to allow retraction from the original port, and then an Endo-GIA stapler was used to perform a wedge resection, encompassing the nodule.  Four firings of the 45-mm purple cartridge were required.  The specimen then was removed through the anterior incision and sent for frozen section.  While awaiting the results of the frozen section, the right lung was ventilated.  Frozen section subsequently returned with evidence of inflammation.  No carcinoma  was seen and the margins were clear.  Unusual tumors could not be ruled out, but there was no evidence of lung cancer.  The staple line was inspected.  There was good hemostasis.  ProGEL was applied in the staple line.  A 28-French chest tube was placed through the original port incision and directed to the apex.  It was secured with two #1 silk sutures.  The right lung was once again reinflated and the scope was withdrawn.  The remaining 2 incisions were closed with 2-0 Vicryl fascial sutures followed by a 3-0 Vicryl subcuticular suture.  All sponge, needle, and instrument counts were correct at the end of the procedure.  The patient was taken from the operating room to the postanesthetic care unit extubated and in good condition.     Salvatore Decent [name], M.D.     SCH/MEDQ  D:  12/03/2011  T:  12/04/2011  Job:  [id number]

## 2011-12-04 NOTE — Progress Notes (Signed)
Utilization review completed.  

## 2011-12-04 NOTE — Progress Notes (Signed)
Patient agitated, insisting nurse call PA immediately to discuss removing foley.  Explained repeatedly that PA will be in this am.  Patient up to chair, tolerated well.  Charge nurse notified of patient's request to call PA about removing foley.  Will continue to monitor.  Vivi Martens RN

## 2011-12-04 NOTE — Progress Notes (Signed)
Pt refused to have foley removed, states it will be to much trouble getting to BR to void, will wait until chest tube comes out

## 2011-12-04 NOTE — Progress Notes (Addendum)
[address]            [place] [address]          [phone]     1 Day Post-Op Procedure(s) (LRB): VIDEO ASSISTED THORACOSCOPY (VATS)/WEDGE RESECTION (Right)  Subjective: OOB in chair.  Breathing stable after a coughing spell last night.  Had a lot of pain early on, but very comfortable at present except wants Foley removed.   Objective: Vital signs in last 24 hours: Patient Vitals for the past 24 hrs:  BP Temp Temp src Pulse Resp SpO2 Height Weight  12/04/11 0424 115/91 mmHg 98.2 F (36.8 C) Oral 83  19  95 % - -  12/04/11 0310 - - - - 15  95 % - -  12/04/11 0125 - - - 98  15  95 % - -  12/03/11 2000 - - - - 14  95 % - -  12/03/11 1904 103/69 mmHg 97.6 F (36.4 C) Oral 85  14  94 % 5\' 10"  (1.778 m) 225 lb 12 oz (102.4 kg)  12/03/11 1900 - 98.2 F (36.8 C) - - - - - -  12/03/11 1858 - - - - 12  96 % - -  12/03/11 1848 108/66 mmHg - - 80  16  93 % - -  12/03/11 1833 108/57 mmHg - - 79  16  95 % - -  12/03/11 1818 99/60 mmHg - - 76  18  94 % - -  12/03/11 1816 101/62 mmHg - - 76  22  94 % - -  12/03/11 1804 103/70 mmHg - - 77  15  96 % - -  12/03/11 1748 125/70 mmHg - - 78  17  97 % - -  12/03/11 1733 150/89 mmHg - - 90  23  96 % - -  12/03/11 1728 - 97.5 F (36.4 C) - - 20  98 % - -  12/03/11 1405 - - - 74  16  98 % - -  12/03/11 1359 - - - 72  18  100 % - -  12/03/11 1355 - - - 75  22  99 % - -  12/03/11 1111 115/77 mmHg 98 F (36.7 C) Oral 77  20  94 % - -   Current Weight  12/03/11 225 lb 12 oz (102.4 kg)     Intake/Output from previous day: 08/26 0701 - 08/27 0700 In: 2800 [I.V.:2750; IV Piggyback:50] Out: 885 [Urine:750; Blood:75; Chest Tube:60]    PHYSICAL EXAM:  Heart: RRR Lungs: clear Wound: dressed and dry Chest tube: no air leak    Lab Results: CBC: Basename 12/04/11 0545  WBC 12.2*  HGB 12.6*  HCT 38.0*  PLT 178   BMET:  Basename 12/04/11 0545  NA 134*  K 4.1  CL 99  CO2 26  GLUCOSE 141*    BUN 11  CREATININE 0.93  CALCIUM 8.7    CXR: stable, ? Tiny apical ptx   Assessment/Plan: S/P Procedure(s) (LRB): VIDEO ASSISTED THORACOSCOPY (VATS)/WEDGE RESECTION (Right)  Stable from pulm standpoint.  Will start IS/pulm toilet.  GU- pt requests Foley to be d/c'ed this am.  He states he has had prostate problems in the past, but is not on any meds.  He is aware that Foley may need to be reinserted if he is unable to void, but he is uncomfortable, so we  will d/c it and watch closely.  CT with no air leak, CXR stable.  Will place CT to water seal.  Pain is well controlled. Pt is concerned with using PCA as he is a recovering alcoholic.  He has not taken any po pain meds yet post-op.  We have restarted his chronic meds (Neurontin, Oxycodone, Klonapin).  Watch today and if he does ok with po meds, will d/c PCA later.  Decrease IVF, d/c a-line, mobilize.  Routine POD 1 progression.   LOS: 1 day    COLLINS,GINA H 12/04/2011  Patient seen and examined. Agree with above. CT to water seal Restart effient

## 2011-12-05 ENCOUNTER — Inpatient Hospital Stay (HOSPITAL_COMMUNITY): Payer: Medicare Other

## 2011-12-05 LAB — CBC
Platelets: 170 10*3/uL (ref 150–400)
RDW: 14.2 % (ref 11.5–15.5)
WBC: 13.3 10*3/uL — ABNORMAL HIGH (ref 4.0–10.5)

## 2011-12-05 LAB — COMPREHENSIVE METABOLIC PANEL
ALT: 19 U/L (ref 0–53)
AST: 47 U/L — ABNORMAL HIGH (ref 0–37)
Albumin: 3.1 g/dL — ABNORMAL LOW (ref 3.5–5.2)
Alkaline Phosphatase: 56 U/L (ref 39–117)
Chloride: 101 mEq/L (ref 96–112)
Potassium: 3.9 mEq/L (ref 3.5–5.1)
Total Bilirubin: 1.3 mg/dL — ABNORMAL HIGH (ref 0.3–1.2)

## 2011-12-05 MED ORDER — HALOPERIDOL LACTATE 5 MG/ML IJ SOLN
2.0000 mg | INTRAMUSCULAR | Status: DC | PRN
  Administered 2011-12-05: 4 mg via INTRAVENOUS

## 2011-12-05 MED ORDER — GUAIFENESIN ER 600 MG PO TB12
600.0000 mg | ORAL_TABLET | Freq: Two times a day (BID) | ORAL | Status: DC
  Administered 2011-12-05 (×2): 600 mg via ORAL
  Filled 2011-12-05 (×4): qty 1

## 2011-12-05 MED ORDER — SODIUM CHLORIDE 0.9 % IJ SOLN
INTRAMUSCULAR | Status: AC
  Filled 2011-12-05: qty 10

## 2011-12-05 MED ORDER — SODIUM CHLORIDE 0.9 % IJ SOLN
INTRAMUSCULAR | Status: AC
  Filled 2011-12-05: qty 20

## 2011-12-05 MED ORDER — HALOPERIDOL LACTATE 5 MG/ML IJ SOLN
INTRAMUSCULAR | Status: AC
  Filled 2011-12-05: qty 1

## 2011-12-05 NOTE — Progress Notes (Addendum)
[address]            [place] [address]          [phone]     2 Days Post-Op Procedure(s) (LRB): VIDEO ASSISTED THORACOSCOPY (VATS)/WEDGE RESECTION (Right)  Subjective: Having a lot of trouble coughing up sputum.  Sore in chest, but not using PCA much.  Wants "all these cables" removed.  RN reports that pt seemed confused and agitated overnight.    Objective: Vital signs in last 24 hours: Patient Vitals for the past 24 hrs:  BP Temp Temp src Pulse Resp SpO2  12/05/11 0515 108/63 mmHg 98.4 F (36.9 C) Oral 94  16  98 %  12/05/11 0400 - - - - 30  94 %  12/05/11 0000 - - - - 30  92 %  12/04/11 2345 129/78 mmHg - - 121  30  92 %  12/04/11 2342 - 98.1 F (36.7 C) Oral - - -  12/04/11 2020 148/77 mmHg 98.5 F (36.9 C) Oral 111  21  95 %  12/04/11 2000 - - - - 23  94 %  12/04/11 1900 - - - 99  - 94 %  12/04/11 1700 - - - 94  23  95 %  12/04/11 1600 146/69 mmHg 98.4 F (36.9 C) Oral 92  22  96 %  12/04/11 1500 - - - 100  22  96 %  12/04/11 1400 - - - 100  19  96 %  12/04/11 1300 - - - 100  17  96 %  12/04/11 1200 119/59 mmHg 98.8 F (37.1 C) Oral 98  20  96 %  12/04/11 1100 - - - 96  18  97 %  12/04/11 1005 139/69 mmHg - - - - -  12/04/11 0800 - 98.1 F (36.7 C) Oral 101  19  96 %   Current Weight  12/03/11 225 lb 12 oz (102.4 kg)     Intake/Output from previous day: 08/27 0701 - 08/28 0700 In: 875 [I.V.:725; IV Piggyback:150] Out: 4202 [Urine:3700; Chest Tube:502]    PHYSICAL EXAM:  Heart: RRR Lungs: clear Wound: dressed and  dry Chest tube: no air leak    Lab Results: CBC: Basename 12/05/11 0500 12/04/11 0545  WBC 13.3* 12.2*  HGB 12.3* 12.6*  HCT 36.9* 38.0*  PLT 170 178   BMET:  Basename 12/05/11 0500 12/04/11 0545  NA 135 134*  K 3.9 4.1  CL 101 99  CO2 30 26  GLUCOSE 121* 141*  BUN 7 11  CREATININE 0.90 0.93  CALCIUM 9.0 8.7    PT/INR: No results found for this basename: LABPROT,INR in the last  72 hours  CXR:  Stable, no obvious ptx  Path pending  Assessment/Plan: S/P Procedure(s) (LRB): VIDEO ASSISTED THORACOSCOPY (VATS)/WEDGE RESECTION (Right)  CT with no air leak, CXR stable.  Hopefully can d/c CT today.  Reportedly was agitated and confused last night, but appears oriented and appropriate this am. Will monitor.  Not using PCA.  Will d/c PCA and saline lock IVF.  Mobilize, work on IS.  Will add Mucinex for thick secretions.   LOS: 2 days    COLLINS,GINA H 12/05/2011  Patient seen and examined. Medically doing well, but having some agitation and confusion issues. No air leak and CXR OK- d/c CT D/C foley and central line Ambulate Home tomorrow if off O2  and eating

## 2011-12-06 ENCOUNTER — Inpatient Hospital Stay (HOSPITAL_COMMUNITY): Payer: Medicare Other

## 2011-12-06 MED ORDER — GUAIFENESIN ER 600 MG PO TB12: 600.0000 mg | ORAL_TABLET | Freq: Two times a day (BID) | ORAL | Status: DC

## 2011-12-06 MED ORDER — PNEUMOCOCCAL VAC POLYVALENT 25 MCG/0.5ML IJ INJ
0.5000 mL | INJECTION | Freq: Once | INTRAMUSCULAR | Status: AC
  Administered 2011-12-06: 0.5 mL via INTRAMUSCULAR
  Filled 2011-12-06: qty 0.5

## 2011-12-06 MED ORDER — OXYCODONE-ACETAMINOPHEN 5-325 MG PO TABS: 1.0000 | ORAL_TABLET | ORAL | Status: DC | PRN

## 2011-12-06 NOTE — Discharge Summary (Signed)
[address]            [place] [address]          [phone]         Discharge Summary  Name: Tyler Mayo DOB: 03-20-1941 71 y.o. MRN: [id number]   Admission Date: 12/03/2011 Discharge Date: 12/06/2011    Admitting Diagnosis: Right middle lobe lung nodule   Discharge Diagnosis:  Right middle lobe lung nodule  Past Medical History  Diagnosis Date  . Lung nodules     s/p bx with Dr. [name] 9/12  . CAD (coronary artery disease)     a.  8.2012 neg MV;  b.  05/12/2011 Inf STEMI - 100% RCA - 3.5x22 Resolute DES, Residual 80% LAD dzs. c. s/p planned DES to LAD 06/01/11.  Marland Kitchen Hyperlipidemia     takes Crestor daily  . History of tobacco abuse   . Myocardial infarction 05/2011  . Joint pain   . Chronic back pain   . Bruises easily     takes Effient daily;stopped on 11/26/11  . Enlarged prostate   . Depression with anxiety     takes Vibryd daily and Clonazepam prn  . Erectile dysfunction   . Depression   . Insomnia     takes an OTC sleep aide      Procedures: RIGHT VIDEO ASSISTED THORACOSCOPY, RIGHT MIDDLE LOBE WEDGE RESECTION on 12/03/2011    HPI:  The patient is a 71 y.o. male with a history of right middle and left upper lobe lung nodules, first found over a year ago and initially worked up by Dr. [name].  He had biopsies done which showed inflammatory changes but no evidence of malignancy. Dr. [name] continued to follow him with serial CT scans. He returned recently to the [facility] office for a scheduled CT scan to followup these nodules and saw Dr. [name]. He denied cough, hemoptysis, fevers or chills.  The CT showed no significant change in the left upper lobe nodule, which the radiologist feels is suspicious for low-grade adenocarcinoma. There has been a slight increase in the size of the cavitary right middle lobe nodule. Dr. [name] recommended proceeding with a VATS/biopsy of the right middle lobe lesion based on the  increase in size.  All risks, benefits and alternatives of surgery were explained in detail, and the patient agreed to proceed.     Hospital Course:  The patient was admitted to [facility] on 12/03/2011. The patient was taken to the operating room and underwent the above procedure.    The postoperative course was uneventful.  His chest tube was removed in the standard fashion, and a followup chest x-ray was negative for pneumothorax. He has been ambulating in the halls without difficulty, and is tolerating a regular diet.  He has remained afebrile and all vital signs are stable.  His final pathology remains pending at the time of discharge, however, intraoperative frozen sections showed inflammation with no evidence of malignancy.  He has been evaluated on today's date and is ready for discharge home.     Recent vital signs:  Filed Vitals:   12/06/11 0741  BP:   Pulse:   Temp: 98.4 F (36.9 C)  Resp:     Recent laboratory studies:  CBC: Basename 12/05/11 0500 12/04/11 0545  WBC 13.3* 12.2*  HGB 12.3* 12.6*  HCT 36.9* 38.0*  PLT 170 178  BMET:  Basename 12/05/11 0500 12/04/11 0545  NA 135 134*  K 3.9 4.1  CL 101 99  CO2 30 26  GLUCOSE 121* 141*  BUN 7 11  CREATININE 0.90 0.93  CALCIUM 9.0 8.7    PT/INR: No results found for this basename: LABPROT,INR in the last 72 hours   Discharge Medications:   Medication List  As of 12/06/2011  9:08 AM   TAKE these medications         aspirin 81 MG tablet   Take 81 mg by mouth daily.      clonazePAM 0.5 MG tablet   Commonly known as: KLONOPIN   Take 0.5 mg by mouth 2 (two) times daily as needed. For anxiety      gabapentin 100 MG capsule   Commonly known as: NEURONTIN   Take 100 mg by mouth 4 (four) times daily.      guaiFENesin 600 MG 12 hr tablet   Commonly known as: MUCINEX   Take 1 tablet (600 mg total) by mouth 2 (two) times daily.      HYDROcodone-acetaminophen 5-325 MG per tablet   Commonly known as:  NORCO/VICODIN   Take 1 tablet by mouth every 4 (four) hours as needed. For pain      metoprolol tartrate 25 MG tablet   Commonly known as: LOPRESSOR   Take 12.5 mg by mouth 2 (two) times daily.      nitroGLYCERIN 0.4 MG SL tablet   Commonly known as: NITROSTAT   Place 0.4 mg under the tongue every 5 (five) minutes as needed. For chest pain      oxyCODONE-acetaminophen 5-325 MG per tablet   Commonly known as: PERCOCET/ROXICET   Take 1-2 tablets by mouth every 4 (four) hours as needed for pain.      prasugrel 10 MG Tabs   Commonly known as: EFFIENT   Take 10 mg by mouth daily.      rosuvastatin 10 MG tablet   Commonly known as: CRESTOR   Take 1 tablet (10 mg total) by mouth at bedtime.      VIIBRYD 40 MG Tabs   Generic drug: Vilazodone HCl   Take 40 mg by mouth daily.             Discharge Instructions:  The patient is to refrain from driving, heavy lifting or strenuous activity.  May shower daily and clean incisions with soap and water.  May resume regular diet.   Follow Up:    Follow-up Information    Follow up with [facility] [facility]. (RN will remove sutures in 1 week-office will call with appointment)       Follow up with HENDRICKSON,STEVEN C, MD in 2 weeks. (Office will call to arrange.)    Contact information:   [address] [place] [address] [phone]           Jessejames Steelman H 12/06/2011, 9:08 AM

## 2011-12-06 NOTE — Progress Notes (Addendum)
                    [address]            [place] [address]          [phone]     3 Days Post-Op Procedure(s) (LRB): VIDEO ASSISTED THORACOSCOPY (VATS)/WEDGE RESECTION (Right)  Subjective: Feels well, no complaints.  Still has productive cough with clear sputum.   Objective: Vital signs in last 24 hours: Patient Vitals for the past 24 hrs:  BP Temp Temp src Pulse Resp SpO2  12/06/11 0410 120/92 mmHg - - - - -  12/06/11 0409 - 98.8 F (37.1 C) Oral 86  15  94 %  12/06/11 0010 114/67 mmHg 100.1 F (37.8 C) Oral 96  21  96 %  12/05/11 2037 137/81 mmHg - - 102  17  95 %  12/05/11 2036 - 99.1 F (37.3 C) Oral - - -  12/05/11 1500 - 98.3 F (36.8 C) Oral - - -  12/05/11 1200 - 98.4 F (36.9 C) Oral - - -  12/05/11 0800 134/78 mmHg 97.7 F (36.5 C) Oral 113  19  92 %   Current Weight  12/03/11 225 lb 12 oz (102.4 kg)     Intake/Output from previous day: 08/28 0701 - 08/29 0700 In: 360 [P.O.:360] Out: 1470 [Urine:1450; Chest Tube:20]    PHYSICAL EXAM:  Heart: RRR Lungs: clear Wound: clean and dry    Lab Results: CBC: Basename 12/05/11 0500 12/04/11 0545  WBC 13.3* 12.2*  HGB 12.3* 12.6*  HCT 36.9* 38.0*  PLT 170 178   BMET:  Basename 12/05/11 0500 12/04/11 0545  NA 135 134*  K 3.9 4.1  CL 101 99  CO2 30 26  GLUCOSE 121* 141*  BUN 7 11  CREATININE 0.90 0.93  CALCIUM 9.0 8.7    PT/INR: No results found for this basename: LABPROT,INR in the last 72 hours  CXR: stable, possibly a tiny apical ptx  Assessment/Plan: S/P Procedure(s) (LRB): VIDEO ASSISTED THORACOSCOPY (VATS)/WEDGE RESECTION (Right) Overall stable and doing well. Pt was on 3L O2 overnight, but presently stable on room air.  Will check sats with ambulation this am. Possibly home later today if off O2.   LOS: 3 days    COLLINS,GINA H 12/06/2011  Patient seen and examined. Agree with above. Path still pending

## 2011-12-06 NOTE — Progress Notes (Signed)
D/c instructions, f/u appt., prescriptions explained and discussed with family and pt via teach back. Discharge home via w/c with wife Tyler Mayo

## 2011-12-07 ENCOUNTER — Telehealth: Payer: Self-pay | Admitting: Cardiovascular Disease

## 2011-12-07 LAB — TYPE AND SCREEN
ABO/RH(D): A NEG
Donor AG Type: NEGATIVE
Unit division: 0
Unit division: 0

## 2011-12-07 NOTE — Telephone Encounter (Signed)
SPOKE WITH PT'S WIFE  PT C/O MUSCLE PAIN INSTRUCTED TO HOLD  CRESTOR AND CALL NEXT WEEK WITH UPDATE  PT  WAS NOT ABLE TO TAKE LIPITOR   HAD MUSCLE PAIN WITH THAT  MED AS WELL ./CY

## 2011-12-07 NOTE — Telephone Encounter (Signed)
Please return call to patient wife [name] [phone]  Regarding crestor medication hold

## 2011-12-11 ENCOUNTER — Other Ambulatory Visit (HOSPITAL_COMMUNITY): Payer: Self-pay | Admitting: Nurse Practitioner

## 2011-12-13 ENCOUNTER — Telehealth: Payer: Self-pay | Admitting: Thoracic Surgery (Cardiothoracic Vascular Surgery)

## 2011-12-13 NOTE — Telephone Encounter (Signed)
Spoke with Tyler Mayo regarding his pathology.  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

## 2011-12-14 ENCOUNTER — Ambulatory Visit (INDEPENDENT_AMBULATORY_CARE_PROVIDER_SITE_OTHER): Payer: Self-pay

## 2011-12-14 DIAGNOSIS — Z4802 Encounter for removal of sutures: Secondary | ICD-10-CM

## 2011-12-14 DIAGNOSIS — D381 Neoplasm of uncertain behavior of trachea, bronchus and lung: Secondary | ICD-10-CM

## 2011-12-14 NOTE — Progress Notes (Signed)
Removed 2 sutures from chest tube site, the incision site is non approximal.  No signs of infection and Pt tolerated well. Appt scheduled with Dr [name] on 12/25/11. Pt aware.

## 2011-12-16 ENCOUNTER — Emergency Department (HOSPITAL_COMMUNITY): Payer: Medicare Other

## 2011-12-16 ENCOUNTER — Inpatient Hospital Stay (HOSPITAL_COMMUNITY)
Admission: EM | Admit: 2011-12-16 | Discharge: 2011-12-24 | DRG: 885 | Disposition: A | Discharge status: Home / Self Care | Payer: Medicare Other | Attending: Internal Medicine | Admitting: Internal Medicine

## 2011-12-16 ENCOUNTER — Encounter (HOSPITAL_COMMUNITY): Payer: Self-pay

## 2011-12-16 DIAGNOSIS — F419 Anxiety disorder, unspecified: Secondary | ICD-10-CM

## 2011-12-16 DIAGNOSIS — Z9861 Coronary angioplasty status: Secondary | ICD-10-CM

## 2011-12-16 DIAGNOSIS — F29 Unspecified psychosis not due to a substance or known physiological condition: Principal | ICD-10-CM | POA: Diagnosis present

## 2011-12-16 DIAGNOSIS — Z79899 Other long term (current) drug therapy: Secondary | ICD-10-CM

## 2011-12-16 DIAGNOSIS — R5381 Other malaise: Secondary | ICD-10-CM

## 2011-12-16 DIAGNOSIS — F329 Major depressive disorder, single episode, unspecified: Secondary | ICD-10-CM | POA: Diagnosis present

## 2011-12-16 DIAGNOSIS — F411 Generalized anxiety disorder: Secondary | ICD-10-CM | POA: Diagnosis present

## 2011-12-16 DIAGNOSIS — F528 Other sexual dysfunction not due to a substance or known physiological condition: Secondary | ICD-10-CM

## 2011-12-16 DIAGNOSIS — G4700 Insomnia, unspecified: Secondary | ICD-10-CM | POA: Diagnosis present

## 2011-12-16 DIAGNOSIS — K4090 Unilateral inguinal hernia, without obstruction or gangrene, not specified as recurrent: Secondary | ICD-10-CM

## 2011-12-16 DIAGNOSIS — R339 Retention of urine, unspecified: Secondary | ICD-10-CM | POA: Diagnosis present

## 2011-12-16 DIAGNOSIS — E538 Deficiency of other specified B group vitamins: Secondary | ICD-10-CM

## 2011-12-16 DIAGNOSIS — I2119 ST elevation (STEMI) myocardial infarction involving other coronary artery of inferior wall: Secondary | ICD-10-CM

## 2011-12-16 DIAGNOSIS — F3289 Other specified depressive episodes: Secondary | ICD-10-CM

## 2011-12-16 DIAGNOSIS — Z87891 Personal history of nicotine dependence: Secondary | ICD-10-CM

## 2011-12-16 DIAGNOSIS — D381 Neoplasm of uncertain behavior of trachea, bronchus and lung: Secondary | ICD-10-CM

## 2011-12-16 DIAGNOSIS — R918 Other nonspecific abnormal finding of lung field: Secondary | ICD-10-CM

## 2011-12-16 DIAGNOSIS — I498 Other specified cardiac arrhythmias: Secondary | ICD-10-CM | POA: Diagnosis not present

## 2011-12-16 DIAGNOSIS — I252 Old myocardial infarction: Secondary | ICD-10-CM

## 2011-12-16 DIAGNOSIS — I2510 Atherosclerotic heart disease of native coronary artery without angina pectoris: Secondary | ICD-10-CM

## 2011-12-16 DIAGNOSIS — R410 Disorientation, unspecified: Secondary | ICD-10-CM

## 2011-12-16 DIAGNOSIS — R5383 Other fatigue: Secondary | ICD-10-CM

## 2011-12-16 DIAGNOSIS — C8582 Other specified types of non-Hodgkin lymphoma, intrathoracic lymph nodes: Secondary | ICD-10-CM | POA: Diagnosis present

## 2011-12-16 DIAGNOSIS — Z7982 Long term (current) use of aspirin: Secondary | ICD-10-CM

## 2011-12-16 DIAGNOSIS — E785 Hyperlipidemia, unspecified: Secondary | ICD-10-CM

## 2011-12-16 DIAGNOSIS — F22 Delusional disorders: Secondary | ICD-10-CM

## 2011-12-16 DIAGNOSIS — R4182 Altered mental status, unspecified: Secondary | ICD-10-CM

## 2011-12-16 LAB — CBC WITH DIFFERENTIAL/PLATELET
Basophils Absolute: 0.1 10*3/uL (ref 0.0–0.1)
Basophils Relative: 1 % (ref 0–1)
Eosinophils Absolute: 0.5 10*3/uL (ref 0.0–0.7)
Eosinophils Relative: 5 % (ref 0–5)
HCT: 41.7 % (ref 39.0–52.0)
Hemoglobin: 14.3 g/dL (ref 13.0–17.0)
Lymphocytes Relative: 27 % (ref 12–46)
Lymphs Abs: 2.7 K/uL (ref 0.7–4.0)
MCH: 32.1 pg (ref 26.0–34.0)
MCHC: 34.3 g/dL (ref 30.0–36.0)
MCV: 93.5 fL (ref 78.0–100.0)
Monocytes Absolute: 0.8 10*3/uL (ref 0.1–1.0)
Monocytes Relative: 8 % (ref 3–12)
Neutro Abs: 6.1 K/uL (ref 1.7–7.7)
Neutrophils Relative %: 61 % (ref 43–77)
Platelets: 526 K/uL — ABNORMAL HIGH (ref 150–400)
RBC: 4.46 MIL/uL (ref 4.22–5.81)
RDW: 14 % (ref 11.5–15.5)
WBC: 10.1 K/uL (ref 4.0–10.5)

## 2011-12-16 LAB — APTT: aPTT: 38 s — ABNORMAL HIGH (ref 24–37)

## 2011-12-16 LAB — ETHANOL: Alcohol, Ethyl (B): <11 mg/dL (ref 0–11)

## 2011-12-16 LAB — COMPREHENSIVE METABOLIC PANEL
AST: 25 U/L (ref 0–37)
Albumin: 3.2 g/dL — ABNORMAL LOW (ref 3.5–5.2)
CO2: 25 mEq/L (ref 19–32)
Calcium: 9.6 mg/dL (ref 8.4–10.5)
Creatinine, Ser: 1.02 mg/dL (ref 0.50–1.35)
GFR calc non Af Amer: 73 mL/min — ABNORMAL LOW (ref >90)
Total Protein: 8.5 g/dL — ABNORMAL HIGH (ref 6.0–8.3)

## 2011-12-16 LAB — COMPREHENSIVE METABOLIC PANEL WITH GFR
ALT: 26 U/L (ref 0–53)
Alkaline Phosphatase: 74 U/L (ref 39–117)
BUN: 13 mg/dL (ref 6–23)
Chloride: 100 meq/L (ref 96–112)
GFR calc Af Amer: 85 mL/min — ABNORMAL LOW (ref >90)
Glucose, Bld: 96 mg/dL (ref 70–99)
Potassium: 4.1 meq/L (ref 3.5–5.1)
Sodium: 135 meq/L (ref 135–145)
Total Bilirubin: 0.4 mg/dL (ref 0.3–1.2)

## 2011-12-16 LAB — PROTIME-INR
INR: 1.17 (ref 0.00–1.49)
Prothrombin Time: 15.1 s (ref 11.6–15.2)

## 2011-12-16 LAB — AMMONIA: Ammonia: <10 umol/L — ABNORMAL LOW (ref 11–60)

## 2011-12-16 MED ORDER — HALOPERIDOL LACTATE 5 MG/ML IJ SOLN
5.0000 mg | Freq: Once | INTRAMUSCULAR | Status: AC
Start: 2011-12-16 — End: 2011-12-16
  Administered 2011-12-16: 5 mg via INTRAVENOUS
  Filled 2011-12-16: qty 1

## 2011-12-16 MED ORDER — LORAZEPAM 2 MG/ML IJ SOLN
1.0000 mg | Freq: Once | INTRAMUSCULAR | Status: AC
  Administered 2011-12-16: 1 mg via INTRAVENOUS
  Filled 2011-12-16: qty 1

## 2011-12-16 NOTE — ED Provider Notes (Signed)
History     CSN: [id number]  Arrival date & time 12/16/11  2134   First MD Initiated Contact with Patient 12/16/11 2203      Chief Complaint  Patient presents with  . Altered Mental Status    (Consider location/radiation/quality/duration/timing/severity/associated sxs/prior treatment) HPI Comments: Level 5 caveat due to altered mental status.  Pt with confusion that has waxed and waned since about 12 noon today per spouse.  Pt had VATS procedure to remove lung nodules 2 weeks ago.  During hospitalization, he apparently had an episode where he was hallucinating and required haldol per spouse.  At home a few days ago, pt briefly had confusion per spouse.  Today, he was saying incorrect things about baseball game when he is normally very lucid and smart about baseball.  Tonight, he couldn't find the television, walking into the bathroom, dining room and other rooms.  Since being here for less than 30 minutes, he argues with me that he has been here a long time, he is irritated and thinks that we are keeping his spouse here against her will and that she has to work tomorrow and he is upset that we are ruining her day tomorrow.  He denies HA.  He does admit that he may have had some trouble concentrating earlier today.    Patient is a 71 y.o. male presenting with altered mental status. The history is provided by the patient, medical records and the spouse. The history is limited by the condition of the patient.  Altered Mental Status    Past Medical History  Diagnosis Date  . Lung nodules     s/p bx with Dr. [name] 9/12  . CAD (coronary artery disease)     a.  8.2012 neg MV;  b.  05/12/2011 Inf STEMI - 100% RCA - 3.5x22 Resolute DES, Residual 80% LAD dzs. c. s/p planned DES to LAD 06/01/11.  Marland Kitchen Hyperlipidemia     takes Crestor daily  . History of tobacco abuse   . Myocardial infarction 05/2011  . Joint pain   . Chronic back pain   . Bruises easily     takes Effient daily;stopped on 11/26/11    . Enlarged prostate   . Depression with anxiety     takes Vibryd daily and Clonazepam prn  . Erectile dysfunction   . Depression   . Insomnia     takes an OTC sleep aide    Past Surgical History  Procedure Date  . Tonsillectomy   . Adenoidectomy   . Ankle fracture surgery   . Cervical disc surgery     3 level fusion with Cadaver Maurine Minister June 2009 successful.  No complications  . Fiberoptic bronchoscopy with endobronchial 09/23/2010  . L1 vertebroplasty and l1 core biopsy. 08/18/2010  . Right thigh abscess incision and drainage. 05/04/2010  . Right inguinal hernia repair with extended prolene hernia 12/24/2008  . Decompressive anterior cervical diskectomy, c5-c6 and c6-c7. 08/20/2007  . Cardiac catheterization   . Cardiac stents     2 stents   . Colonoscopy   . Cataracts removed     bilateral  . Right lung biopsy     Family History  Problem Relation Age of Onset  . Alcohol abuse Father   . Lung cancer Father   . Heart disease Mother     History  Substance Use Topics  . Smoking status: Former Smoker -- 0.5 packs/day for 25 years    Types: Cigars    Quit date: 04/10/1991  .  Smokeless tobacco: Never Used  . Alcohol Use: No     ETOH free x 10yrs      Review of Systems  Unable to perform ROS: Other  Psychiatric/Behavioral: Positive for altered mental status.    Allergies  Lipitor  Home Medications   Current Outpatient Rx  Name Route Sig Dispense Refill  . ASPIRIN 81 MG PO TABS Oral Take 81 mg by mouth daily.    Marland Kitchen CLONAZEPAM 0.5 MG PO TABS Oral Take 0.5 mg by mouth 2 (two) times daily as needed. For anxiety    . GABAPENTIN 100 MG PO CAPS Oral Take 100 mg by mouth 4 (four) times daily.     . GUAIFENESIN ER 600 MG PO TB12 Oral Take 1 tablet (600 mg total) by mouth 2 (two) times daily.    Marland Kitchen HYDROCODONE-ACETAMINOPHEN 5-325 MG PO TABS Oral Take 1 tablet by mouth every 4 (four) hours as needed. For pain    . METOPROLOL TARTRATE 25 MG PO TABS Oral Take 12.5 mg by  mouth 2 (two) times daily.    Marland Kitchen NITROGLYCERIN 0.4 MG SL SUBL Sublingual Place 0.4 mg under the tongue every 5 (five) minutes as needed. For chest pain    . OXYCODONE-ACETAMINOPHEN 5-325 MG PO TABS Oral Take 1-2 tablets by mouth every 4 (four) hours as needed for pain. 30 tablet 0  . PRASUGREL HCL 10 MG PO TABS Oral Take 10 mg by mouth daily.    Marland Kitchen ROSUVASTATIN CALCIUM 10 MG PO TABS Oral Take 1 tablet (10 mg total) by mouth at bedtime. 30 tablet 11  . VILAZODONE HCL 40 MG PO TABS Oral Take 40 mg by mouth daily.      BP 137/89  Pulse 117  Temp 98 F (36.7 C)  Resp 18  Ht 5\' 10"  (1.778 m)  Wt 245 lb (111.131 kg)  BMI 35.15 kg/m2  SpO2 98%  Physical Exam  Nursing note and vitals reviewed. Constitutional: He appears well-developed and well-nourished.  HENT:  Head: Normocephalic.  Pulmonary/Chest: Effort normal.  Abdominal: Soft.  Neurological: He is alert. He has normal strength. No cranial nerve deficit. He exhibits normal muscle tone. Coordination normal. GCS eye subscore is 4. GCS verbal subscore is 4. GCS motor subscore is 6.  Psychiatric: His mood appears not anxious. His affect is angry, labile and inappropriate. His affect is not blunt. His speech is tangential. His speech is not rapid and/or pressured, not delayed and not slurred. He is agitated and aggressive. He is not slowed and not combative. Thought content is paranoid and delusional. He expresses impulsivity and inappropriate judgment. He expresses no homicidal and no suicidal ideation. He expresses no suicidal plans and no homicidal plans. He is communicative. He exhibits abnormal recent memory. He is attentive.    ED Course  Procedures (including critical care time)   CRITICAL CARE Performed by: Lear Ng.   Total critical care time: 30 min  Critical care time was exclusive of separately billable procedures and treating other patients.  Critical care was necessary to treat or prevent imminent or  life-threatening deterioration.  Critical care was time spent personally by me on the following activities: development of treatment plan with patient and/or surrogate as well as nursing, discussions with consultants, evaluation of patient's response to treatment, examination of patient, obtaining history from patient or surrogate, ordering and performing treatments and interventions, ordering and review of laboratory studies, ordering and review of radiographic studies, pulse oximetry and re-evaluation of patient's condition.   Labs  Reviewed  GLUCOSE, CAPILLARY - Abnormal; Notable for the following:    Glucose-Capillary 110 (*)     All other components within normal limits  CBC WITH DIFFERENTIAL - Abnormal; Notable for the following:    Platelets 526 (*)     All other components within normal limits  COMPREHENSIVE METABOLIC PANEL - Abnormal; Notable for the following:    Total Protein 8.5 (*)     Albumin 3.2 (*)     GFR calc non Af Amer 73 (*)     GFR calc Af Amer 85 (*)     All other components within normal limits  APTT - Abnormal; Notable for the following:    aPTT 38 (*)     All other components within normal limits  AMMONIA - Abnormal; Notable for the following:    Ammonia <10 (*)     All other components within normal limits  ETHANOL  PROTIME-INR  URINE RAPID DRUG SCREEN (HOSP PERFORMED)  URINALYSIS, ROUTINE W REFLEX MICROSCOPIC   Ct Head Wo Contrast  12/16/2011  *RADIOLOGY REPORT*  Clinical Data: Altered mental status.  Difficulty with speech.  CT HEAD WITHOUT CONTRAST  Technique:  Contiguous axial images were obtained from the base of the skull through the vertex without contrast.  Comparison: None.  Findings: Mild diffuse cerebral atrophy.  Ventricular dilatation consistent with central atrophy.  No mass effect or midline shift. No abnormal extra-axial fluid collections.  Low attenuation changes in the deep white matter consistent with patchy small vessel ischemic change.   Gray-white matter junctions are distinct.  Basal cisterns are not effaced.  No evidence of acute intracranial hemorrhage.  No depressed skull fractures.  Visualized paranasal sinuses and mastoid air cells are not opacified.  IMPRESSION: No acute intracranial abnormalities.   Original Report Authenticated By: Marlon Pel, M.D.    Dg Chest Port 1 View  12/16/2011  *RADIOLOGY REPORT*  Clinical Data: Altered mental status.  Recent VATS.  PORTABLE CHEST - 1 VIEW  Comparison: 12/06/2011.  Findings: Linear and patchy density at the right lung base with improvement.  Minimal right pleural fluid, also improved.  The left lateral costophrenic angle is not currently included.  No visible pleural fluid on the left.  The included left lung is clear. Stable cervical spine fixation hardware.  IMPRESSION:  1.  Improving right basilar atelectasis and possible pneumonia. 2.  Small right pleural effusion, decreased.   Original Report Authenticated By: Darrol Angel, M.D.      1. Altered mental status   2. Delusions     RA sat is 98% and I interpret to be normal.  12:25 AM Mood is more pleasant, but still labile, still changed from baseline.  I recommend admit for monitoring psych consult, mecication adjustment if this is more likely cause.  HR is down to 100 after meds.    MDM  Pt with abn psych behavior, possibly organic, versus medication induced.  Pt is not on steroids.  He is on klonopin prn based on meds.  Pt has no prior h/o psych.  He had hernia repair several years ago and had no prior episodes related to that per spouse.  Pt here is agreeable after I spoke to him at length.  Will get head CT, labs, I recommend admission.  I doubt new psychiatric illness, although possible.  More likely medication.  Will give ativan and haldol for now and monitor carefully.  Tachycardia I think is from agitation.  Gavin Pound. Oletta Lamas, MD 12/17/11 1610

## 2011-12-16 NOTE — ED Notes (Signed)
Pt presents extremely agitation and irritated. Wife at bedside, concerned that patient is not his normal personality.  Pt had a recent biospy of lung and recent admission at hospital.

## 2011-12-17 ENCOUNTER — Inpatient Hospital Stay (HOSPITAL_COMMUNITY): Payer: Medicare Other

## 2011-12-17 ENCOUNTER — Telehealth: Payer: Self-pay | Admitting: Pulmonary Disease

## 2011-12-17 DIAGNOSIS — E785 Hyperlipidemia, unspecified: Secondary | ICD-10-CM

## 2011-12-17 DIAGNOSIS — R410 Disorientation, unspecified: Secondary | ICD-10-CM | POA: Diagnosis present

## 2011-12-17 DIAGNOSIS — R4182 Altered mental status, unspecified: Secondary | ICD-10-CM

## 2011-12-17 DIAGNOSIS — I2510 Atherosclerotic heart disease of native coronary artery without angina pectoris: Secondary | ICD-10-CM

## 2011-12-17 DIAGNOSIS — F329 Major depressive disorder, single episode, unspecified: Secondary | ICD-10-CM

## 2011-12-17 DIAGNOSIS — F3289 Other specified depressive episodes: Secondary | ICD-10-CM

## 2011-12-17 LAB — URINALYSIS, ROUTINE W REFLEX MICROSCOPIC
Bilirubin Urine: NEGATIVE
Glucose, UA: NEGATIVE mg/dL
Hgb urine dipstick: NEGATIVE
Ketones, ur: NEGATIVE mg/dL
Leukocytes, UA: NEGATIVE
Nitrite: NEGATIVE
Protein, ur: NEGATIVE mg/dL
Specific Gravity, Urine: 1.022 (ref 1.005–1.030)
Urobilinogen, UA: 0.2 mg/dL (ref 0.0–1.0)
pH: 5.5 (ref 5.0–8.0)

## 2011-12-17 LAB — COMPREHENSIVE METABOLIC PANEL
ALT: 23 U/L (ref 0–53)
AST: 23 U/L (ref 0–37)
Alkaline Phosphatase: 78 U/L (ref 39–117)
CO2: 24 mEq/L (ref 19–32)
Calcium: 9.2 mg/dL (ref 8.4–10.5)
Chloride: 96 mEq/L (ref 96–112)
GFR calc non Af Amer: 88 mL/min — ABNORMAL LOW (ref >90)
Potassium: 3.8 mEq/L (ref 3.5–5.1)
Sodium: 132 mEq/L — ABNORMAL LOW (ref 135–145)

## 2011-12-17 LAB — CBC
Hemoglobin: 13.6 g/dL (ref 13.0–17.0)
MCH: 31.9 pg (ref 26.0–34.0)
MCHC: 34.6 g/dL (ref 30.0–36.0)
RDW: 13.9 % (ref 11.5–15.5)

## 2011-12-17 LAB — RAPID URINE DRUG SCREEN, HOSP PERFORMED
Amphetamines: NOT DETECTED
Barbiturates: NOT DETECTED
Benzodiazepines: POSITIVE — AB
Cocaine: NOT DETECTED
Opiates: POSITIVE — AB
Tetrahydrocannabinol: NOT DETECTED

## 2011-12-17 LAB — TSH: TSH: 3.192 u[IU]/mL (ref 0.350–4.500)

## 2011-12-17 MED ORDER — SODIUM CHLORIDE 0.9 % IV SOLN
INTRAVENOUS | Status: AC
  Administered 2011-12-17: 03:00:00 via INTRAVENOUS

## 2011-12-17 MED ORDER — NITROGLYCERIN 0.4 MG SL SUBL: 0.4000 mg | SUBLINGUAL_TABLET | SUBLINGUAL | Status: DC | PRN

## 2011-12-17 MED ORDER — ASPIRIN EC 81 MG PO TBEC
81.0000 mg | DELAYED_RELEASE_TABLET | Freq: Every day | ORAL | Status: DC
  Administered 2011-12-17 – 2011-12-24 (×7): 81 mg via ORAL
  Filled 2011-12-17 (×8): qty 1

## 2011-12-17 MED ORDER — HEPARIN SODIUM (PORCINE) 5000 UNIT/ML IJ SOLN
5000.0000 [IU] | Freq: Three times a day (TID) | INTRAMUSCULAR | Status: DC
  Administered 2011-12-17 – 2011-12-23 (×14): 5000 [IU] via SUBCUTANEOUS
  Filled 2011-12-17 (×26): qty 1

## 2011-12-17 MED ORDER — TAMSULOSIN HCL 0.4 MG PO CAPS
0.4000 mg | ORAL_CAPSULE | Freq: Every day | ORAL | Status: DC
  Administered 2011-12-17 – 2011-12-24 (×8): 0.4 mg via ORAL
  Filled 2011-12-17 (×8): qty 1

## 2011-12-17 MED ORDER — PRASUGREL HCL 10 MG PO TABS
10.0000 mg | ORAL_TABLET | Freq: Every day | ORAL | Status: DC
  Administered 2011-12-17 – 2011-12-18 (×2): 10 mg via ORAL
  Filled 2011-12-17 (×2): qty 1

## 2011-12-17 MED ORDER — HALOPERIDOL LACTATE 5 MG/ML IJ SOLN
2.0000 mg | Freq: Once | INTRAMUSCULAR | Status: AC
  Administered 2011-12-17: 2 mg via INTRAVENOUS
  Filled 2011-12-17: qty 0.4

## 2011-12-17 MED ORDER — GADOBENATE DIMEGLUMINE 529 MG/ML IV SOLN
20.0000 mL | Freq: Once | INTRAVENOUS | Status: AC | PRN
  Administered 2011-12-17: 20 mL via INTRAVENOUS

## 2011-12-17 MED ORDER — METOPROLOL TARTRATE 12.5 MG HALF TABLET
12.5000 mg | ORAL_TABLET | Freq: Two times a day (BID) | ORAL | Status: DC
  Administered 2011-12-17 – 2011-12-18 (×5): 12.5 mg via ORAL
  Filled 2011-12-17 (×7): qty 1

## 2011-12-17 MED ORDER — ACETAMINOPHEN 650 MG RE SUPP: 650.0000 mg | Freq: Four times a day (QID) | RECTAL | Status: DC | PRN

## 2011-12-17 MED ORDER — ALUM & MAG HYDROXIDE-SIMETH 200-200-20 MG/5ML PO SUSP: 30.0000 mL | Freq: Four times a day (QID) | ORAL | Status: DC | PRN

## 2011-12-17 MED ORDER — DOXYCYCLINE HYCLATE 50 MG PO CAPS
50.0000 mg | ORAL_CAPSULE | Freq: Every day | ORAL | Status: DC
  Filled 2011-12-17: qty 1

## 2011-12-17 MED ORDER — ONDANSETRON HCL 4 MG/2ML IJ SOLN: 4.0000 mg | Freq: Four times a day (QID) | INTRAMUSCULAR | Status: DC | PRN

## 2011-12-17 MED ORDER — GUAIFENESIN ER 600 MG PO TB12
600.0000 mg | ORAL_TABLET | Freq: Two times a day (BID) | ORAL | Status: DC
  Administered 2011-12-17 – 2011-12-24 (×14): 600 mg via ORAL
  Filled 2011-12-17 (×16): qty 1

## 2011-12-17 MED ORDER — ONDANSETRON HCL 4 MG PO TABS: 4.0000 mg | ORAL_TABLET | Freq: Four times a day (QID) | ORAL | Status: DC | PRN

## 2011-12-17 MED ORDER — ATORVASTATIN CALCIUM 20 MG PO TABS
20.0000 mg | ORAL_TABLET | Freq: Every day | ORAL | Status: DC
  Administered 2011-12-17: 20 mg via ORAL
  Filled 2011-12-17 (×4): qty 1

## 2011-12-17 MED ORDER — POLYETHYLENE GLYCOL 3350 17 G PO PACK
17.0000 g | PACK | Freq: Every day | ORAL | Status: DC | PRN
  Filled 2011-12-17 (×2): qty 1

## 2011-12-17 MED ORDER — HALOPERIDOL LACTATE 5 MG/ML IJ SOLN
1.0000 mg | INTRAMUSCULAR | Status: DC | PRN
  Administered 2011-12-17 – 2011-12-20 (×3): 1 mg via INTRAVENOUS
  Filled 2011-12-17 (×3): qty 0.2

## 2011-12-17 MED ORDER — CLONAZEPAM 0.5 MG PO TABS
0.5000 mg | ORAL_TABLET | Freq: Two times a day (BID) | ORAL | Status: DC
  Administered 2011-12-17 – 2011-12-24 (×13): 0.5 mg via ORAL
  Filled 2011-12-17 (×14): qty 1

## 2011-12-17 MED ORDER — VILAZODONE HCL 40 MG PO TABS: 40.0000 mg | ORAL_TABLET | Freq: Every day | ORAL | Status: DC

## 2011-12-17 MED ORDER — ACETAMINOPHEN 325 MG PO TABS: 650.0000 mg | ORAL_TABLET | Freq: Four times a day (QID) | ORAL | Status: DC | PRN

## 2011-12-17 MED ORDER — SODIUM CHLORIDE 0.9 % IJ SOLN
3.0000 mL | Freq: Two times a day (BID) | INTRAMUSCULAR | Status: DC
  Administered 2011-12-17 – 2011-12-19 (×4): 3 mL via INTRAVENOUS

## 2011-12-17 NOTE — ED Notes (Signed)
Bladder scanned for approx  238 cc urine

## 2011-12-17 NOTE — Progress Notes (Signed)
1400 Foley catheter D/C as  ordered

## 2011-12-17 NOTE — ED Notes (Signed)
Dr Toma Aran at bedside.

## 2011-12-17 NOTE — Telephone Encounter (Signed)
LMTCB

## 2011-12-17 NOTE — Progress Notes (Signed)
PATIENT DETAILS Name: Tyler Mayo Age: 71 y.o. Sex: male Date of Birth: 1941-03-03 Admit Date: 12/16/2011 Admitting Physician Huey Bienenstock, MD ZOX:[id number] ALLEN, MD  Subjective: Admitted with Confusion. This am during my evaluation-calm, answered some of my questions appropriately. He did want his Foley Catheter removed.Ambulated in the hallway this am  Assessment/Plan: Active Problems: AMS -mostly in the form of confusion -he however does answer a lot of questions appropriately -per wife-this was sudden in onset, when he woke up 9/8-he was in his usual state of health -does take Klonopin, Norco, Vilazodone and ?Neurontin as outpatient -his neck is supple, he does not appear to have either a UTI or PNA to suggest this is from infection. No meningeal signs on exam -given mostly clear speech and non focal exam-doubt vascular etiology-CT head negative as well -potentially this could be poly-pharmacy or drug/meds related -for now-will get MRI brain to make sure this is not structural issue, and restart Klonopin -will closely monitor and see if he gets better, if this is persistent, or if MRI Brain shows major abnormalities will get Neurology to see. May need EEG at some point.  ?Urinary Retention -patient requesting multiple times to see if he could get his foley removed -especially with confusion-and agitation last night-want to avoid foley trauma-therefore will try a voiding trial -c/w BPH   DEPRESSION -resume Vilazodone-in the next few days   CAD (coronary artery disease) -no chest pain or SOB -last PCI earlier this year -c/w ASA and Effient -c/w Statin and Metoprolol as well   Hyperlipidemia -c/w Statin   Pulmonary nodules -s/p VATS and biopsy last month -biopsy benign-however will need follow up with Dr Shelle Iron as outpatient  Disposition: Remain inpatient  DVT Prophylaxis: Prophylactic Lovenox or Heparin  Code Status: Full code or  DNR  Procedures:  None  CONSULTS:  None  PHYSICAL EXAM: Vital signs in last 24 hours: Filed Vitals:   12/16/11 2141 12/17/11 0040 12/17/11 0242 12/17/11 1131  BP: 137/89 157/88 166/101 145/94  Pulse: 117 105 101 96  Temp: 98 F (36.7 C) 97.1 F (36.2 C) 98.8 F (37.1 C) 98.6 F (37 C)  TempSrc:  Oral Oral Oral  Resp: 18  18   Height: 5\' 10"  (1.778 m)  5' 1.2" (1.554 m)   Weight: 111.131 kg (245 lb)  99.4 kg (219 lb 2.2 oz)   SpO2: 98% 99% 96% 96%    Weight change:  Body mass index is 41.14 kg/(m^2).   Gen Exam: Awake and Mostly alert with clear speech.   Neck: Supple, No JVD.  Chest: B/L Clear.   CVS: S1 S2 Regular, no murmurs.  Abdomen: soft, BS +, non tender, non distended.  Extremities: no edema, lower extremities warm to touch. Neurologic: Non Focal.   Skin: No Rash.   Wounds: N/A.    Intake/Output from previous day:  Intake/Output Summary (Last 24 hours) at 12/17/11 1439 Last data filed at 12/17/11 1300  Gross per 24 hour  Intake    225 ml  Output   2050 ml  Net  -1825 ml     LAB RESULTS: CBC  Lab 12/17/11 0851 12/16/11 2239  WBC 11.0* 10.1  HGB 13.6 14.3  HCT 39.3 41.7  PLT 523* 526*  MCV 92.0 93.5  MCH 31.9 32.1  MCHC 34.6 34.3  RDW 13.9 14.0  LYMPHSABS -- 2.7  MONOABS -- 0.8  EOSABS -- 0.5  BASOSABS -- 0.1  BANDABS -- --    Chemistries   Lab  12/17/11 0851 12/16/11 2239  NA 132* 135  K 3.8 4.1  CL 96 100  CO2 24 25  GLUCOSE 121* 96  BUN 9 13  CREATININE 0.83 1.02  CALCIUM 9.2 9.6  MG -- --    CBG:  Lab 12/16/11 2148  GLUCAP 110*    GFR Estimated Creatinine Clearance: 86 ml/min (by C-G formula based on Cr of 0.83).  Coagulation profile  Lab 12/16/11 2239  INR 1.17  PROTIME --    Cardiac Enzymes No results found for this basename: CK:3,CKMB:3,TROPONINI:3,MYOGLOBIN:3 in the last 168 hours  No components found with this basename: POCBNP:3 No results found for this basename: DDIMER:2 in the last 72 hours No  results found for this basename: HGBA1C:2 in the last 72 hours No results found for this basename: CHOL:2,HDL:2,LDLCALC:2,TRIG:2,CHOLHDL:2,LDLDIRECT:2 in the last 72 hours  Basename 12/17/11 0851  TSH 3.192  T4TOTAL --  T3FREE --  THYROIDAB --   No results found for this basename: VITAMINB12:2,FOLATE:2,FERRITIN:2,TIBC:2,IRON:2,RETICCTPCT:2 in the last 72 hours No results found for this basename: LIPASE:2,AMYLASE:2 in the last 72 hours  Urine Studies No results found for this basename: UACOL:2,UAPR:2,USPG:2,UPH:2,UTP:2,UGL:2,UKET:2,UBIL:2,UHGB:2,UNIT:2,UROB:2,ULEU:2,UEPI:2,UWBC:2,URBC:2,UBAC:2,CAST:2,CRYS:2,UCOM:2,BILUA:2 in the last 72 hours  MICROBIOLOGY: No results found for this or any previous visit (from the past 240 hour(s)).  RADIOLOGY STUDIES/RESULTS: Dg Chest 2 View  12/06/2011  *RADIOLOGY REPORT*  Clinical Data: Right lung mass, status post VATS  CHEST - 2 VIEW  Comparison: 12/05/2011  Findings: Patchy right lower lobe opacity, possibly atelectasis. Small bilateral pleural effusions.  No pneumothorax.  The heart is mildly enlarged, unchanged.  Mild degenerative changes of the visualized thoracolumbar spine. Cervical spine fixation hardware.  IMPRESSION: No pneumothorax is seen.  Suspected right lower lobe atelectasis with small bilateral pleural effusions.   Original Report Authenticated By: Charline Bills, M.D.    Dg Chest 2 View  12/03/2011  *RADIOLOGY REPORT*  Clinical Data: Preop radiograph.  CHEST - 2 VIEW  Comparison: 05/12/2011  Findings: Heart size and mediastinal contours are unremarkable.  No pleural effusion or pulmonary edema identified.  No airspace consolidation identified.  IMPRESSION:  1.  No acute cardiopulmonary abnormalities.   Original Report Authenticated By: Rosealee Albee, M.D.    Ct Head Wo Contrast  12/16/2011  *RADIOLOGY REPORT*  Clinical Data: Altered mental status.  Difficulty with speech.  CT HEAD WITHOUT CONTRAST  Technique:  Contiguous axial  images were obtained from the base of the skull through the vertex without contrast.  Comparison: None.  Findings: Mild diffuse cerebral atrophy.  Ventricular dilatation consistent with central atrophy.  No mass effect or midline shift. No abnormal extra-axial fluid collections.  Low attenuation changes in the deep white matter consistent with patchy small vessel ischemic change.  Gray-white matter junctions are distinct.  Basal cisterns are not effaced.  No evidence of acute intracranial hemorrhage.  No depressed skull fractures.  Visualized paranasal sinuses and mastoid air cells are not opacified.  IMPRESSION: No acute intracranial abnormalities.   Original Report Authenticated By: Marlon Pel, M.D.    Dg Chest Port 1 View  12/16/2011  *RADIOLOGY REPORT*  Clinical Data: Altered mental status.  Recent VATS.  PORTABLE CHEST - 1 VIEW  Comparison: 12/06/2011.  Findings: Linear and patchy density at the right lung base with improvement.  Minimal right pleural fluid, also improved.  The left lateral costophrenic angle is not currently included.  No visible pleural fluid on the left.  The included left lung is clear. Stable cervical spine fixation hardware.  IMPRESSION:  1.  Improving right basilar atelectasis and possible pneumonia. 2.  Small right pleural effusion, decreased.   Original Report Authenticated By: Darrol Angel, M.D.    Dg Chest Port 1 View  12/05/2011  *RADIOLOGY REPORT*  Clinical Data: Chest tube removal  PORTABLE CHEST - 1 VIEW  Comparison: Chest radiograph 11/27/2011 at 6:20 hours  Findings: Interval removal of right chest tube.  No evidence of pneumothorax.  There is persistent bibasilar atelectasis,  right greater on the right.  Stable enlarged heart silhouette.  IMPRESSION: No evidence of pneumothorax following right chest tube removal.  Bibasilar atelectasis.   Original Report Authenticated By: Genevive Bi, M.D.    Dg Chest Port 1 View  12/05/2011  *RADIOLOGY REPORT*  Clinical  Data: Lung mass, VATS.  PORTABLE CHEST - 1 VIEW  Comparison: 12/04/2011 and CT chest 11/13/2011.  Findings: Trachea is midline.  Right IJ central line tip projects over the SVC.  Right chest tube remains in place at the apex of the right hemithorax.  Heart is enlarged, stable. Postoperative changes in the right hemithorax.  Lungs are low volume with diffuse interstitial prominence and indistinctness.  No pneumothorax. Bibasilar air space disease, right greater than left.  Possible tiny right pleural effusion.  IMPRESSION:  1.  No pneumothorax with right chest tube in place. 2.  Postoperative changes in the right hemithorax with stable right basilar airspace disease and possible tiny right pleural effusion. 3.  Mild diffuse interstitial prominence may be due to vascular crowding.  Pulmonary edema cannot be excluded. 4.  Left basilar airspace disease, stable.   Original Report Authenticated By: Reyes Ivan, M.D.    Dg Chest Port 1 View  12/04/2011  *RADIOLOGY REPORT*  Clinical Data: Chest tube placement.  Shortness of breath.  PORTABLE CHEST - 1 VIEW  Comparison: Chest x-ray 12/03/2011.  Findings: There is a right-sided internal jugular central venous catheter with tip terminating in the distal superior vena cava. Right-sided chest tube in position with tip and sideport projecting over the right hemithorax.  No appreciable pneumothorax is identified on today's examination.  Lung volumes are low with bibasilar linear opacities favored to reflect areas of subsegmental atelectasis.  Postoperative changes of right middle lobe wedge resection are again noted with persistent ill-defined opacity in this region, likely reflects a resolving postoperative hemorrhage and atelectasis.  Pulmonary vascular crowding accentuated by low lung volumes, without frank pulmonary edema.  Heart size is borderline enlarged (likely accentuated by low lung volumes). Mediastinal contours appear widened, likely accentuated by low lung  volumes, rightward rotation and lordotic positioning.  IMPRESSION: 1.  Persistent low lung volumes with bibasilar areas of atelectasis and postoperative changes in the right middle lobe, as above. 2.  Support apparatus, as above.   Original Report Authenticated By: Florencia Reasons, M.D.    Dg Chest Portable 1 View  12/03/2011  *RADIOLOGY REPORT*  Clinical Data: Postop.  PORTABLE CHEST - 1 VIEW  Comparison: Earlier today.  Findings: Interval right chest tube with its tip at the medial right lung apex.  No pneumothorax.  Interval right jugular catheter tip in the superior vena cava.  Interval linear densities in both lower lung zones with a decreased inspiration.  The cardiac silhouette currently appears mildly enlarged, magnified by the decreased inspiration.  Cervical spine fixation hardware is unchanged.  IMPRESSION:  1.  Poor inspiration with mild bibasilar atelectasis. 2.  Right chest tube without pneumothorax.   Original Report Authenticated By: Darrol Angel, M.D.  MEDICATIONS: Scheduled Meds:   . sodium chloride   Intravenous STAT  . aspirin EC  81 mg Oral Daily  . atorvastatin  20 mg Oral q1800  . clonazePAM  0.5 mg Oral BID  . guaiFENesin  600 mg Oral BID  . haloperidol lactate  2 mg Intravenous Once  . haloperidol lactate  5 mg Intravenous Once  . heparin  5,000 Units Subcutaneous Q8H  . LORazepam  1 mg Intravenous Once  . metoprolol tartrate  12.5 mg Oral BID  . prasugrel  10 mg Oral Daily  . sodium chloride  3 mL Intravenous Q12H  . Tamsulosin HCl  0.4 mg Oral Daily  . DISCONTD: doxycycline  50 mg Oral Daily  . DISCONTD: Vilazodone HCl  40 mg Oral Daily   Continuous Infusions:   . sodium chloride 75 mL/hr at 12/17/11 0253   PRN Meds:.acetaminophen, acetaminophen, alum & mag hydroxide-simeth, haloperidol lactate, nitroGLYCERIN, ondansetron (ZOFRAN) IV, ondansetron, polyethylene glycol  Antibiotics: Anti-infectives     Start     Dose/Rate Route Frequency Ordered Stop    12/17/11 1000   doxycycline (VIBRAMYCIN) 50 MG capsule 50 mg  Status:  Discontinued     Comments: Use for Viibryd 40 mg      50 mg Oral Daily 12/17/11 0237 12/17/11 0854           Jeoffrey Massed, MD  [facility] Pager:[phone]  If 7PM-7AM, please contact night-coverage [email or web address] Password [email or web address] 12/17/2011, 2:39 PM   LOS: 1 day

## 2011-12-17 NOTE — Progress Notes (Signed)
Utilization review complete 

## 2011-12-17 NOTE — Telephone Encounter (Signed)
Let pt know he needs ov to go over his pathology.  Will also refer him to a hematologist given the findings on biopsy to make sure no underlying blood disorder.  Will send this to [facility].

## 2011-12-17 NOTE — Progress Notes (Signed)
Pt. Was admitted to the unit at about 0220 this morning with altered mental status.He became highly agitated, Dr. Mliss Fritz notified,Haldol 1mg  was given with no good effect, another 2mg  start given ,pt. became more agitated.Hosp. Security with police came to the room to keep patient under control.Wife came in around 0630am to help keep patient calm.

## 2011-12-17 NOTE — ED Notes (Signed)
Foley cath #[id number] inserted without difficulty.  UA to lab

## 2011-12-17 NOTE — H&P (Signed)
[facility]                                                                                    Patient Demographics  Tyler Mayo, is a 71 y.o. male  CSN: [id number]  MRN: [id number]  DOB - 31-May-1940  Admit Date - 12/16/2011  Outpatient Primary MD for the patient is TODD,JEFFREY Freida Busman, MD   With History of -  Past Medical History  Diagnosis Date  . Lung nodules     s/p bx with Dr. [name] 9/12  . CAD (coronary artery disease)     a.  8.2012 neg MV;  b.  05/12/2011 Inf STEMI - 100% RCA - 3.5x22 Resolute DES, Residual 80% LAD dzs. c. s/p planned DES to LAD 06/01/11.  Marland Kitchen Hyperlipidemia     takes Crestor daily  . History of tobacco abuse   . Myocardial infarction 05/2011  . Joint pain   . Chronic back pain   . Bruises easily     takes Effient daily;stopped on 11/26/11  . Enlarged prostate   . Depression with anxiety     takes Vibryd daily and Clonazepam prn  . Erectile dysfunction   . Depression   . Insomnia     takes an OTC sleep aide      Past Surgical History  Procedure Date  . Tonsillectomy   . Adenoidectomy   . Ankle fracture surgery   . Cervical disc surgery     3 level fusion with Cadaver Maurine Minister June 2009 successful.  No complications  . Fiberoptic bronchoscopy with endobronchial 09/23/2010  . L1 vertebroplasty and l1 core biopsy. 08/18/2010  . Right thigh abscess incision and drainage. 05/04/2010  . Right inguinal hernia repair with extended prolene hernia 12/24/2008  . Decompressive anterior cervical diskectomy, c5-c6 and c6-c7. 08/20/2007  . Cardiac catheterization   . Cardiac stents     2 stents   . Colonoscopy   . Cataracts removed     bilateral  . Right lung biopsy     in for   Chief Complaint  Patient presents with  . Altered Mental Status     HPI  Tyler Mayo  is a 71 y.o. male, with significant past medical history of carotid disease, nodule status post recent VATS and biopsy, with a wedge resection in the right, brought  by his wife for confusion, wife reports patient has been in his usual state of mind to this afternoon, where she started to notice him to be more confused, asking some with question for example asking were sent will control while it was with him, and upon presentation to ED patient was more confused, and actually more agitated and combative in ED, where he required 5 mg of IV Haldol, patient denies any complaints of cough, fever, chills, productive sputum, no pain, nausea, vomiting, and in extending his, and headache ,lightheaded, vertigo, any focal weakness, numbness or tingling, as well denies any facial droop, or slurred speech, patient had CT brain done in ED which did not show any acute pathology, patient basic lab were within normal limits, patient was afebrile, patient is taking Klonopin and hydrocodone, patient denied any use of  extra medication, he reports took 2 of Klonopin and 2 of hydrocodone this afternoon. Patient denies any suicidal or homicidal ideation.   Review of Systems    In addition to the HPI above,  No Fever-chills, No Headache, No changes with Vision or hearing, No problems swallowing food or Liquids, No Chest pain, Cough or Shortness of Breath, No Abdominal pain, No Nausea or Vommitting, Bowel movements are regular, No Blood in stool or Urine, No dysuria, No new skin rashes or bruises, No new joints pains-aches,  No new weakness, tingling, numbness in any extremity, No recent weight gain or loss, No polyuria, polydypsia or polyphagia, No significant Mental Stressors.  A full 10 point Review of Systems was done, except as stated above, all other Review of Systems were negative.   Social History History  Substance Use Topics  . Smoking status: Former Smoker -- 0.5 packs/day for 25 years    Types: Cigars    Quit date: 04/10/1991  . Smokeless tobacco: Never Used  . Alcohol Use: No     ETOH free x 55yrs    Family History Family History  Problem Relation Age of  Onset  . Alcohol abuse Father   . Lung cancer Father   . Heart disease Mother      Prior to Admission medications   Medication Sig Start Date End Date Taking? Authorizing Provider  aspirin 81 MG tablet Take 81 mg by mouth daily.   Yes Historical Provider, MD  clonazePAM (KLONOPIN) 0.5 MG tablet Take 0.5 mg by mouth 2 (two) times daily as needed. For anxiety   Yes Historical Provider, MD  gabapentin (NEURONTIN) 100 MG capsule Take 100 mg by mouth 4 (four) times daily.    Yes Historical Provider, MD  guaiFENesin (MUCINEX) 600 MG 12 hr tablet Take 1 tablet (600 mg total) by mouth 2 (two) times daily. 12/06/11 12/05/12 Yes Wilmon Pali, PA  HYDROcodone-acetaminophen (NORCO) 5-325 MG per tablet Take 1 tablet by mouth every 4 (four) hours as needed. For pain   Yes Historical Provider, MD  metoprolol tartrate (LOPRESSOR) 25 MG tablet Take 12.5 mg by mouth 2 (two) times daily. 05/14/11  Yes Ok Anis, NP  nitroGLYCERIN (NITROSTAT) 0.4 MG SL tablet Place 0.4 mg under the tongue every 5 (five) minutes as needed. For chest pain 05/14/11  Yes Ok Anis, NP  oxyCODONE-acetaminophen (PERCOCET/ROXICET) 5-325 MG per tablet Take 1-2 tablets by mouth every 4 (four) hours as needed for pain. 12/06/11 12/16/11 Yes Wilmon Pali, PA  prasugrel (EFFIENT) 10 MG TABS Take 10 mg by mouth daily.   Yes Historical Provider, MD  rosuvastatin (CRESTOR) 10 MG tablet Take 1 tablet (10 mg total) by mouth at bedtime. 07/16/11 07/15/12 Yes Wendall Stade, MD  Vilazodone HCl (VIIBRYD) 40 MG TABS Take 40 mg by mouth daily.   Yes Historical Provider, MD    Allergies  Allergen Reactions  . Lipitor (Atorvastatin) Other (See Comments)    Joint and muscle pain    Physical Exam  Vitals  Blood pressure 157/88, pulse 105, temperature 97.1 F (36.2 C), temperature source Oral, resp. rate 18, height 5\' 10"  (1.778 m), weight 111.131 kg (245 lb), SpO2 99.00%.   1. General well-nourished male ying in bed in NAD,   2.  mildly anxious and irritated, Not Suicidal or Homicidal, Awake Alert, Oriented X 2, appears to be confused, answering occasional questions inappropriately.  3. No F.N deficits, ALL C.Nerves Intact, Strength 5/5 all 4 extremities, Sensation intact  all 4 extremities, Plantars down going.  4. Ears and Eyes appear Normal, Conjunctivae clear, PERRLA. Moist Oral Mucosa.  5. Supple Neck, No JVD, No cervical lymphadenopathy appriciated, No Carotid Bruits. Negative meningeal signs  6. Symmetrical Chest wall movement, Good air movement bilaterally, mild rales at the right side, surgical sites appear mildly erythematous but not infected ,no discharge or bleed.  7. RRR, No Gallops, Rubs or Murmurs, No Parasternal Heave.  8. Positive Bowel Sounds, Abdomen Soft, Non tender, No organomegaly appriciated,No rebound -guarding or rigidity.  9.  No Cyanosis, Normal Skin Turgor, No Skin Rash or Bruise.  10. Good muscle tone,  joints appear normal , no effusions, Normal ROM.  11. No Palpable Lymph Nodes in Neck or Axillae    Data Review  CBC  Lab 12/16/11 2239  WBC 10.1  HGB 14.3  HCT 41.7  PLT 526*  MCV 93.5  MCH 32.1  MCHC 34.3  RDW 14.0  LYMPHSABS 2.7  MONOABS 0.8  EOSABS 0.5  BASOSABS 0.1  BANDABS --   ------------------------------------------------------------------------------------------------------------------  Chemistries   Lab 12/16/11 2239  NA 135  K 4.1  CL 100  CO2 25  GLUCOSE 96  BUN 13  CREATININE 1.02  CALCIUM 9.6  MG --  AST 25  ALT 26  ALKPHOS 74  BILITOT 0.4   ------------------------------------------------------------------------------------------------------------------ estimated creatinine clearance is 86.5 ml/min (by C-G formula based on Cr of 1.02). ------------------------------------------------------------------------------------------------------------------ No results found for this basename: TSH,T4TOTAL,FREET3,T3FREE,THYROIDAB in the last 72  hours   Coagulation profile  Lab 12/16/11 2239  INR 1.17  PROTIME --   ------------------------------------------------------------------------------------------------------------------- No results found for this basename: DDIMER:2 in the last 72 hours -------------------------------------------------------------------------------------------------------------------  Cardiac Enzymes No results found for this basename: CK:3,CKMB:3,TROPONINI:3,MYOGLOBIN:3 in the last 168 hours ------------------------------------------------------------------------------------------------------------------ No components found with this basename: POCBNP:3   ---------------------------------------------------------------------------------------------------------------  Urinalysis    Component Value Date/Time   COLORURINE YELLOW 11/29/2011 1536   APPEARANCEUR CLEAR 11/29/2011 1536   LABSPEC 1.028 11/29/2011 1536   PHURINE 5.5 11/29/2011 1536   GLUCOSEU NEGATIVE 11/29/2011 1536   HGBUR NEGATIVE 11/29/2011 1536   HGBUR negative 04/13/2009 1008   BILIRUBINUR NEGATIVE 11/29/2011 1536   KETONESUR NEGATIVE 11/29/2011 1536   PROTEINUR NEGATIVE 11/29/2011 1536   UROBILINOGEN 0.2 11/29/2011 1536   NITRITE NEGATIVE 11/29/2011 1536   LEUKOCYTESUR NEGATIVE 11/29/2011 1536    ----------------------------------------------------------------------------------------------------------------    Imaging results:   Dg Chest 2 View  12/06/2011  *RADIOLOGY REPORT*  Clinical Data: Right lung mass, status post VATS  CHEST - 2 VIEW  Comparison: 12/05/2011  Findings: Patchy right lower lobe opacity, possibly atelectasis. Small bilateral pleural effusions.  No pneumothorax.  The heart is mildly enlarged, unchanged.  Mild degenerative changes of the visualized thoracolumbar spine. Cervical spine fixation hardware.  IMPRESSION: No pneumothorax is seen.  Suspected right lower lobe atelectasis with small bilateral pleural effusions.    Original Report Authenticated By: Charline Bills, M.D.    Dg Chest 2 View  12/03/2011  *RADIOLOGY REPORT*  Clinical Data: Preop radiograph.  CHEST - 2 VIEW  Comparison: 05/12/2011  Findings: Heart size and mediastinal contours are unremarkable.  No pleural effusion or pulmonary edema identified.  No airspace consolidation identified.  IMPRESSION:  1.  No acute cardiopulmonary abnormalities.   Original Report Authenticated By: Rosealee Albee, M.D.    Ct Head Wo Contrast  12/16/2011  *RADIOLOGY REPORT*  Clinical Data: Altered mental status.  Difficulty with speech.  CT HEAD WITHOUT CONTRAST  Technique:  Contiguous axial images were obtained  from the base of the skull through the vertex without contrast.  Comparison: None.  Findings: Mild diffuse cerebral atrophy.  Ventricular dilatation consistent with central atrophy.  No mass effect or midline shift. No abnormal extra-axial fluid collections.  Low attenuation changes in the deep white matter consistent with patchy small vessel ischemic change.  Gray-white matter junctions are distinct.  Basal cisterns are not effaced.  No evidence of acute intracranial hemorrhage.  No depressed skull fractures.  Visualized paranasal sinuses and mastoid air cells are not opacified.  IMPRESSION: No acute intracranial abnormalities.   Original Report Authenticated By: Marlon Pel, M.D.    Dg Chest Port 1 View  12/16/2011  *RADIOLOGY REPORT*  Clinical Data: Altered mental status.  Recent VATS.  PORTABLE CHEST - 1 VIEW  Comparison: 12/06/2011.  Findings: Linear and patchy density at the right lung base with improvement.  Minimal right pleural fluid, also improved.  The left lateral costophrenic angle is not currently included.  No visible pleural fluid on the left.  The included left lung is clear. Stable cervical spine fixation hardware.  IMPRESSION:  1.  Improving right basilar atelectasis and possible pneumonia. 2.  Small right pleural effusion, decreased.   Original  Report Authenticated By: Darrol Angel, M.D.    Dg Chest Port 1 View  12/05/2011  *RADIOLOGY REPORT*  Clinical Data: Chest tube removal  PORTABLE CHEST - 1 VIEW  Comparison: Chest radiograph 11/27/2011 at 6:20 hours  Findings: Interval removal of right chest tube.  No evidence of pneumothorax.  There is persistent bibasilar atelectasis,  right greater on the right.  Stable enlarged heart silhouette.  IMPRESSION: No evidence of pneumothorax following right chest tube removal.  Bibasilar atelectasis.   Original Report Authenticated By: Genevive Bi, M.D.    Dg Chest Port 1 View  12/05/2011  *RADIOLOGY REPORT*  Clinical Data: Lung mass, VATS.  PORTABLE CHEST - 1 VIEW  Comparison: 12/04/2011 and CT chest 11/13/2011.  Findings: Trachea is midline.  Right IJ central line tip projects over the SVC.  Right chest tube remains in place at the apex of the right hemithorax.  Heart is enlarged, stable. Postoperative changes in the right hemithorax.  Lungs are low volume with diffuse interstitial prominence and indistinctness.  No pneumothorax. Bibasilar air space disease, right greater than left.  Possible tiny right pleural effusion.  IMPRESSION:  1.  No pneumothorax with right chest tube in place. 2.  Postoperative changes in the right hemithorax with stable right basilar airspace disease and possible tiny right pleural effusion. 3.  Mild diffuse interstitial prominence may be due to vascular crowding.  Pulmonary edema cannot be excluded. 4.  Left basilar airspace disease, stable.   Original Report Authenticated By: Reyes Ivan, M.D.    Dg Chest Port 1 View  12/04/2011  *RADIOLOGY REPORT*  Clinical Data: Chest tube placement.  Shortness of breath.  PORTABLE CHEST - 1 VIEW  Comparison: Chest x-ray 12/03/2011.  Findings: There is a right-sided internal jugular central venous catheter with tip terminating in the distal superior vena cava. Right-sided chest tube in position with tip and sideport projecting over  the right hemithorax.  No appreciable pneumothorax is identified on today's examination.  Lung volumes are low with bibasilar linear opacities favored to reflect areas of subsegmental atelectasis.  Postoperative changes of right middle lobe wedge resection are again noted with persistent ill-defined opacity in this region, likely reflects a resolving postoperative hemorrhage and atelectasis.  Pulmonary vascular crowding accentuated by low lung  volumes, without frank pulmonary edema.  Heart size is borderline enlarged (likely accentuated by low lung volumes). Mediastinal contours appear widened, likely accentuated by low lung volumes, rightward rotation and lordotic positioning.  IMPRESSION: 1.  Persistent low lung volumes with bibasilar areas of atelectasis and postoperative changes in the right middle lobe, as above. 2.  Support apparatus, as above.   Original Report Authenticated By: Florencia Reasons, M.D.    Dg Chest Portable 1 View  12/03/2011  *RADIOLOGY REPORT*  Clinical Data: Postop.  PORTABLE CHEST - 1 VIEW  Comparison: Earlier today.  Findings: Interval right chest tube with its tip at the medial right lung apex.  No pneumothorax.  Interval right jugular catheter tip in the superior vena cava.  Interval linear densities in both lower lung zones with a decreased inspiration.  The cardiac silhouette currently appears mildly enlarged, magnified by the decreased inspiration.  Cervical spine fixation hardware is unchanged.  IMPRESSION:  1.  Poor inspiration with mild bibasilar atelectasis. 2.  Right chest tube without pneumothorax.   Original Report Authenticated By: Darrol Angel, M.D.      Assessment & Plan  Active Problems:  Confusion with non-focal neuro exam  DEPRESSION  CAD (coronary artery disease)  Hyperlipidemia  Pulmonary nodules    1. confusion, patient has no focal neurological deficits, presentation might be due to delirium, agents Klonopin, hydrocodone, and gabapentin had been  held, as well Patient had some mild urinary retention, might provoke his delirium, as after insertion of the Foley patient had 300 cc urine output, so we'll continue to the patient on when necessary Haldol, will follow on urinalysis and urine and tox screen, and if no improvement inpatient delirium may need to consult psychiatry.  2. CAD, patient denies any chest pain or shortness of breath, we'll continue him on aspirin and effifnt , statin and beta blockers.  3. Depression, we'll continue her medication,  4. Hyperlipidemia, continue with statin  5. Pulmonary nodules, status post recent VATS, with wedge resection, wife reports results were benign.  6.urinary retention: Will start Flomax, status post Foley inserted.   DVT Prophylaxis Heparin  AM Labs Ordered, also please review Full Orders  Family Communication: Admission, patients condition and plan of care including tests being ordered have been discussed with the patient and wife who indicate understanding and agree with the plan and Code Status.  Code Status full Disposition Plan: Home  Time spent in minutes :  Condition GUARDED  Miamarie Moll M.D on 12/17/2011 at 1:30 AM    [facility] Office  [phone]

## 2011-12-18 ENCOUNTER — Inpatient Hospital Stay (HOSPITAL_COMMUNITY): Payer: Medicare Other

## 2011-12-18 DIAGNOSIS — I6529 Occlusion and stenosis of unspecified carotid artery: Secondary | ICD-10-CM

## 2011-12-18 LAB — VITAMIN B12: Vitamin B-12: 474 pg/mL (ref 211–911)

## 2011-12-18 LAB — FOLATE: Folate: 16.7 ng/mL

## 2011-12-18 MED ORDER — CLONAZEPAM 0.5 MG PO TABS
0.5000 mg | ORAL_TABLET | Freq: Every evening | ORAL | Status: DC | PRN
  Administered 2011-12-20: 0.5 mg via ORAL
  Filled 2011-12-18: qty 1

## 2011-12-18 MED ORDER — DEXTROSE 5 % IV SOLN
10.0000 mg/kg | Freq: Three times a day (TID) | INTRAVENOUS | Status: DC
  Administered 2011-12-18 – 2011-12-23 (×14): 730 mg via INTRAVENOUS
  Filled 2011-12-18 (×21): qty 14.6

## 2011-12-18 MED ORDER — SODIUM CHLORIDE 0.9 % IV SOLN
INTRAVENOUS | Status: DC
  Administered 2011-12-18: 100 mL via INTRAVENOUS
  Administered 2011-12-19 – 2011-12-20 (×4): via INTRAVENOUS
  Administered 2011-12-22: 50 mL/h via INTRAVENOUS

## 2011-12-18 NOTE — Telephone Encounter (Signed)
Noted  

## 2011-12-18 NOTE — Progress Notes (Signed)
ANTIBIOTIC CONSULT NOTE - INITIAL  Pharmacy Consult for Acyclovir Indication: jSuspected Encephalitis  Allergies  Allergen Reactions  . Lipitor (Atorvastatin) Other (See Comments)    Joint and muscle pain    Patient Measurements: Height: 5\' 10"  (177.8 cm) Weight: 219 lb 2.2 oz (99.4 kg) IBW/kg (Calculated) : 73  Adjusted Body Weight:   Vital Signs: Temp: 97.5 F (36.4 C) (09/10 1423) Temp src: Oral (09/10 1423) BP: 156/74 mmHg (09/10 1423) Pulse Rate: 106  (09/10 1423) Intake/Output from previous day: 09/09 0701 - 09/10 0700 In: 225 [P.O.:222; I.V.:3] Out: 1500 [Urine:1500] Intake/Output from this shift: Total I/O In: 483 [P.O.:480; I.V.:3] Out: -   Labs:  Basename 12/17/11 0851 12/16/11 2239  WBC 11.0* 10.1  HGB 13.6 14.3  PLT 523* 526*  LABCREA -- --  CREATININE 0.83 1.02   Estimated Creatinine Clearance: 100.7 ml/min (by C-G formula based on Cr of 0.83). No results found for this basename: VANCOTROUGH:2,VANCOPEAK:2,VANCORANDOM:2,GENTTROUGH:2,GENTPEAK:2,GENTRANDOM:2,TOBRATROUGH:2,TOBRAPEAK:2,TOBRARND:2,AMIKACINPEAK:2,AMIKACINTROU:2,AMIKACIN:2, in the last 72 hours   Microbiology: Recent Results (from the past 720 hour(s))  SURGICAL PCR SCREEN     Status: Normal   Collection Time   11/29/11  3:36 PM      Component Value Range Status Comment   MRSA, PCR NEGATIVE  NEGATIVE Final    Staphylococcus aureus NEGATIVE  NEGATIVE Final     Medical History: Past Medical History  Diagnosis Date  . Lung nodules     s/p bx with Dr. [name] 9/12  . CAD (coronary artery disease)     a.  8.2012 neg MV;  b.  05/12/2011 Inf STEMI - 100% RCA - 3.5x22 Resolute DES, Residual 80% LAD dzs. c. s/p planned DES to LAD 06/01/11.  Marland Kitchen Hyperlipidemia     takes Crestor daily  . History of tobacco abuse   . Myocardial infarction 05/2011  . Joint pain   . Chronic back pain   . Bruises easily     takes Effient daily;stopped on 11/26/11  . Enlarged prostate   . Depression with anxiety    takes Vibryd daily and Clonazepam prn  . Erectile dysfunction   . Depression   . Insomnia     takes an OTC sleep aide    Medications:  Scheduled:    . acyclovir  10 mg/kg (Ideal) Intravenous Q8H  . aspirin EC  81 mg Oral Daily  . atorvastatin  20 mg Oral q1800  . clonazePAM  0.5 mg Oral BID  . guaiFENesin  600 mg Oral BID  . heparin  5,000 Units Subcutaneous Q8H  . metoprolol tartrate  12.5 mg Oral BID  . sodium chloride  3 mL Intravenous Q12H  . Tamsulosin HCl  0.4 mg Oral Daily  . DISCONTD: prasugrel  10 mg Oral Daily   Assessment: 71 yr old male admitted on 9/8 with altered mental status. He had a VATS on 8/27 and has been mildly confused since that time. Now to start on acyclovir for suspected encephalitis.   Plan:  Start acyclovir 10 mg/kg (IBW) IV every 8 hrs.  Tyler Mayo 12/18/2011,6:59 PM

## 2011-12-18 NOTE — Telephone Encounter (Signed)
KC, Pt called back and Nicholos Johns tried to make appt, but he is currently admitted as  Of 12/16/11

## 2011-12-18 NOTE — Progress Notes (Addendum)
PATIENT DETAILS Name: Tyler Mayo Age: 71 y.o. Sex: male Date of Birth: 1940-05-06 Admit Date: 12/16/2011 Admitting Physician Huey Bienenstock, MD [id number] ALLEN, MD  Subjective: Essentially unchanged-still confused  Assessment/Plan: Active Problems: AMS -mostly in the form of confusion -he however does answer a lot of questions appropriately -per wife-this was sudden in onset, when he woke up 9/8-he was in his usual state of health, but today-retrospectively-she does state that he did have intermittent periods of confusion since his discharge from the VAT's procedure -does take Klonopin, Norco, Vilazodone and ?Neurontin as outpatient -his neck is supple, he does not appear to have either a UTI or PNA to suggest this is from infection. No meningeal signs on exam -given mostly clear speech and non focal exam-doubt vascular etiology-CT head negative as well -potentially this could be poly-pharmacy or drug/meds related -MRI brain done 9/9-?2 small infarcts-at this time not sure if this is attributable to his AMS -c/w Klonopin -since not getting better-may need EEG-will consult Neuro-have already spoken with Neuro MD -Addendum: Spoke with Dr. Dierdre Forth 9/10-patient's rheumatologist-case discussed including prior positive autoimmune markers-per Dr. Dierdre Forth these markers were subsequently negative when he repeated them. He does not think that the patient's current presentation is consistent with autoimmune disease.  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   Gen Exam: Awake and Mostly alert with clear speech.   Neck: Supple, No JVD.  Chest: B/L Clear.  No rales CVS: S1 S2 Regular, no murmurs.  Abdomen: soft, BS +, non tender, non distended.  Extremities: no edema, lower extremities warm to touch. Neurologic: Non Focal.   Skin: No Rash.   Wounds: N/A.    Intake/Output from previous day:  Intake/Output Summary (Last 24 hours) at 12/18/11 1111 Last data filed at 12/18/11 1020  Gross per 24 hour  Intake      3 ml  Output   1500 ml  Net  -1497 ml     LAB RESULTS: CBC  Lab 12/17/11 0851 12/16/11 2239  WBC 11.0* 10.1  HGB 13.6 14.3  HCT 39.3 41.7  PLT 523* 526*  MCV 92.0 93.5  MCH 31.9 32.1  MCHC 34.6 34.3  RDW 13.9 14.0  LYMPHSABS -- 2.7  MONOABS -- 0.8  EOSABS -- 0.5  BASOSABS -- 0.1  BANDABS -- --    Chemistries   Lab 12/17/11 0851 12/16/11  2239  NA 132* 135  K 3.8 4.1  CL 96 100  CO2 24 25  GLUCOSE 121* 96  BUN 9 13  CREATININE 0.83 1.02  CALCIUM 9.2 9.6  MG -- --    CBG:  Lab 12/16/11 2148  GLUCAP 110*    GFR Estimated Creatinine Clearance: 86 ml/min (by C-G formula based on Cr of 0.83).  Coagulation profile  Lab 12/16/11 2239  INR 1.17  PROTIME --    Cardiac Enzymes No results found for this basename: CK:3,CKMB:3,TROPONINI:3,MYOGLOBIN:3 in the last 168 hours  No components found with this basename:  POCBNP:3 No results found for this basename: DDIMER:2 in the last 72 hours No results found for this basename: HGBA1C:2 in the last 72 hours No results found for this basename: CHOL:2,HDL:2,LDLCALC:2,TRIG:2,CHOLHDL:2,LDLDIRECT:2 in the last 72 hours  Basename 12/17/11 0851  TSH 3.192  T4TOTAL --  T3FREE --  THYROIDAB --   No results found for this basename: VITAMINB12:2,FOLATE:2,FERRITIN:2,TIBC:2,IRON:2,RETICCTPCT:2 in the last 72 hours No results found for this basename: LIPASE:2,AMYLASE:2 in the last 72 hours  Urine Studies No results found for this basename: UACOL:2,UAPR:2,USPG:2,UPH:2,UTP:2,UGL:2,UKET:2,UBIL:2,UHGB:2,UNIT:2,UROB:2,ULEU:2,UEPI:2,UWBC:2,URBC:2,UBAC:2,CAST:2,CRYS:2,UCOM:2,BILUA:2 in the last 72 hours  MICROBIOLOGY: No results found for this or any previous visit (from the past 240 hour(s)).  RADIOLOGY STUDIES/RESULTS: Dg Chest 2 View  12/06/2011  *RADIOLOGY REPORT*  Clinical Data: Right lung mass, status post VATS  CHEST - 2 VIEW  Comparison: 12/05/2011  Findings: Patchy right lower lobe opacity, possibly atelectasis. Small bilateral pleural effusions.  No pneumothorax.  The heart is mildly enlarged, unchanged.  Mild degenerative changes of the visualized thoracolumbar spine. Cervical spine fixation hardware.  IMPRESSION: No pneumothorax is seen.  Suspected right lower lobe atelectasis with small bilateral pleural effusions.   Original Report Authenticated By: Charline Bills, M.D.    Dg Chest 2 View  12/03/2011  *RADIOLOGY REPORT*  Clinical Data: Preop radiograph.  CHEST - 2 VIEW  Comparison: 05/12/2011  Findings: Heart size and mediastinal contours are unremarkable.  No pleural effusion or pulmonary edema identified.  No airspace consolidation identified.  IMPRESSION:  1.  No acute cardiopulmonary abnormalities.   Original Report Authenticated By: Rosealee Albee, M.D.    Ct Head Wo Contrast  12/16/2011  *RADIOLOGY REPORT*  Clinical Data: Altered mental status.   Difficulty with speech.  CT HEAD WITHOUT CONTRAST  Technique:  Contiguous axial images were obtained from the base of the skull through the vertex without contrast.  Comparison: None.  Findings: Mild diffuse cerebral atrophy.  Ventricular dilatation consistent with central atrophy.  No mass effect or midline shift. No abnormal extra-axial fluid collections.  Low attenuation changes in the deep white matter consistent with patchy small vessel ischemic change.  Gray-white matter junctions are distinct.  Basal cisterns are not effaced.  No evidence of acute intracranial hemorrhage.  No depressed skull fractures.  Visualized paranasal sinuses and mastoid air cells are not opacified.  IMPRESSION: No acute intracranial abnormalities.   Original Report Authenticated By: Marlon Pel, M.D.    Dg Chest Port 1 View  12/16/2011  *RADIOLOGY REPORT*  Clinical Data: Altered mental status.  Recent VATS.  PORTABLE CHEST - 1 VIEW  Comparison: 12/06/2011.  Findings: Linear and patchy density at the right lung base with improvement.  Minimal right pleural fluid, also improved.  The left lateral costophrenic angle is not currently included.  No visible pleural fluid on the left.  The included left lung is clear. Stable cervical spine fixation hardware.  IMPRESSION:  1.  Improving right  basilar atelectasis and possible pneumonia. 2.  Small right pleural effusion, decreased.   Original Report Authenticated By: Darrol Angel, M.D.    Dg Chest Port 1 View  12/05/2011  *RADIOLOGY REPORT*  Clinical Data: Chest tube removal  PORTABLE CHEST - 1 VIEW  Comparison: Chest radiograph 11/27/2011 at 6:20 hours  Findings: Interval removal of right chest tube.  No evidence of pneumothorax.  There is persistent bibasilar atelectasis,  right greater on the right.  Stable enlarged heart silhouette.  IMPRESSION: No evidence of pneumothorax following right chest tube removal.  Bibasilar atelectasis.   Original Report Authenticated By: Genevive Bi, M.D.    Dg Chest Port 1 View  12/05/2011  *RADIOLOGY REPORT*  Clinical Data: Lung mass, VATS.  PORTABLE CHEST - 1 VIEW  Comparison: 12/04/2011 and CT chest 11/13/2011.  Findings: Trachea is midline.  Right IJ central line tip projects over the SVC.  Right chest tube remains in place at the apex of the right hemithorax.  Heart is enlarged, stable. Postoperative changes in the right hemithorax.  Lungs are low volume with diffuse interstitial prominence and indistinctness.  No pneumothorax. Bibasilar air space disease, right greater than left.  Possible tiny right pleural effusion.  IMPRESSION:  1.  No pneumothorax with right chest tube in place. 2.  Postoperative changes in the right hemithorax with stable right basilar airspace disease and possible tiny right pleural effusion. 3.  Mild diffuse interstitial prominence may be due to vascular crowding.  Pulmonary edema cannot be excluded. 4.  Left basilar airspace disease, stable.   Original Report Authenticated By: Reyes Ivan, M.D.    Dg Chest Port 1 View  12/04/2011  *RADIOLOGY REPORT*  Clinical Data: Chest tube placement.  Shortness of breath.  PORTABLE CHEST - 1 VIEW  Comparison: Chest x-ray 12/03/2011.  Findings: There is a right-sided internal jugular central venous catheter with tip terminating in the distal superior vena cava. Right-sided chest tube in position with tip and sideport projecting over the right hemithorax.  No appreciable pneumothorax is identified on today's examination.  Lung volumes are low with bibasilar linear opacities favored to reflect areas of subsegmental atelectasis.  Postoperative changes of right middle lobe wedge resection are again noted with persistent ill-defined opacity in this region, likely reflects a resolving postoperative hemorrhage and atelectasis.  Pulmonary vascular crowding accentuated by low lung volumes, without frank pulmonary edema.  Heart size is borderline enlarged (likely accentuated by low  lung volumes). Mediastinal contours appear widened, likely accentuated by low lung volumes, rightward rotation and lordotic positioning.  IMPRESSION: 1.  Persistent low lung volumes with bibasilar areas of atelectasis and postoperative changes in the right middle lobe, as above. 2.  Support apparatus, as above.   Original Report Authenticated By: Florencia Reasons, M.D.    Dg Chest Portable 1 View  12/03/2011  *RADIOLOGY REPORT*  Clinical Data: Postop.  PORTABLE CHEST - 1 VIEW  Comparison: Earlier today.  Findings: Interval right chest tube with its tip at the medial right lung apex.  No pneumothorax.  Interval right jugular catheter tip in the superior vena cava.  Interval linear densities in both lower lung zones with a decreased inspiration.  The cardiac silhouette currently appears mildly enlarged, magnified by the decreased inspiration.  Cervical spine fixation hardware is unchanged.  IMPRESSION:  1.  Poor inspiration with mild bibasilar atelectasis. 2.  Right chest tube without pneumothorax.   Original Report Authenticated By: Darrol Angel, M.D.     MEDICATIONS: Scheduled  Meds:    . sodium chloride   Intravenous STAT  . aspirin EC  81 mg Oral Daily  . atorvastatin  20 mg Oral q1800  . clonazePAM  0.5 mg Oral BID  . guaiFENesin  600 mg Oral BID  . heparin  5,000 Units Subcutaneous Q8H  . metoprolol tartrate  12.5 mg Oral BID  . prasugrel  10 mg Oral Daily  . sodium chloride  3 mL Intravenous Q12H  . Tamsulosin HCl  0.4 mg Oral Daily   Continuous Infusions:    . sodium chloride 75 mL/hr at 12/17/11 0253   PRN Meds:.acetaminophen, acetaminophen, alum & mag hydroxide-simeth, gadobenate dimeglumine, haloperidol lactate, nitroGLYCERIN, ondansetron (ZOFRAN) IV, ondansetron, polyethylene glycol  Antibiotics: Anti-infectives     Start     Dose/Rate Route Frequency Ordered Stop   12/17/11 1000   doxycycline (VIBRAMYCIN) 50 MG capsule 50 mg  Status:  Discontinued     Comments: Use for  Viibryd 40 mg      50 mg Oral Daily 12/17/11 0237 12/17/11 0854           Jeoffrey Massed, MD  [facility] Pager:[phone]  If 7PM-7AM, please contact night-coverage [email or web address] Password [email or web address] 12/18/2011, 11:11 AM   LOS: 2 days

## 2011-12-18 NOTE — Telephone Encounter (Signed)
Pt's spouse returned call this morning. Per Verlon Au I advised her that we needed to schedule an rov with dr clance. She stated that pt is currently in the hospital and that she will call back when this can be scheduled. Hazel Sams

## 2011-12-18 NOTE — Consult Note (Addendum)
[facility] CONSULT NOTE     Reason for Consult: confusion    HPI:    Tyler Mayo is an 71 y.o. male with PmHx lung nodules S/P biopsies which showed inflammatory changes but no evidence of malignancy.  On 12/04/11 patient underwent a VATS with wedge resection of right middle lobe nodule and D/C on 12/06/11. Wife states that he never returned to baseline after returning home.  He remained mildly confused and at times would believe they were late for a flight or trip.  On Sunday morning she noted a significant difference, where patient was unable to recognize the remote and was talking nonsensical.   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  Patient has remained afebrile, most recent WBC 11.0, PLT count 523,   Previous test --ANA and P-ANCA (+)  Past Medical History  Diagnosis Date  . Lung nodules     s/p bx with Dr. [name] 9/12  . CAD (coronary artery disease)     a.  8.2012 neg MV;  b.  05/12/2011 Inf STEMI - 100% RCA - 3.5x22 Resolute DES, Residual 80% LAD dzs. c. s/p planned DES to LAD 06/01/11.  Marland Kitchen Hyperlipidemia     takes Crestor daily  . History of tobacco abuse   . Myocardial infarction 05/2011  . Joint pain   . Chronic back pain   . Bruises easily     takes Effient daily;stopped on 11/26/11  . Enlarged prostate   . Depression with anxiety     takes Vibryd daily and Clonazepam prn  . Erectile dysfunction   . Depression   . Insomnia     takes an OTC sleep aide    Past Surgical History  Procedure Date  . Tonsillectomy   . Adenoidectomy   . Ankle fracture surgery   . Cervical disc surgery     3 level fusion with Cadaver Maurine Minister June 2009 successful.  No complications  . Fiberoptic bronchoscopy with endobronchial 09/23/2010  . L1 vertebroplasty and l1 core biopsy.  08/18/2010  . Right thigh abscess incision and drainage. 05/04/2010  . Right inguinal hernia repair with extended prolene hernia 12/24/2008  . Decompressive anterior cervical diskectomy, c5-c6 and c6-c7. 08/20/2007  . Cardiac catheterization   . Cardiac stents     2 stents   . Colonoscopy   . Cataracts removed     bilateral  . Right lung biopsy     Family History  Problem Relation Age of Onset  . Alcohol abuse Father   . Lung cancer Father   . Heart disease Mother     Social History:  reports that he quit smoking about 20 years ago. His smoking use included Cigars. He has never used smokeless tobacco. He reports that he does not drink alcohol or use illicit drugs.  Allergies  Allergen Reactions  . Lipitor (Atorvastatin) Other (See Comments)    Joint and muscle pain    Medications:    Prior to Admission:  Prescriptions prior to admission  Medication Sig Dispense Refill  . aspirin EC 81 MG tablet Take 81 mg by mouth daily.      . clonazePAM (KLONOPIN) 0.5 MG tablet Take 0.5 mg by mouth 3 (three) times daily as needed. For anxiety.      Marland Kitchen  gabapentin (NEURONTIN) 100 MG capsule Take 100 mg by mouth 3 (three) times daily.      Marland Kitchen guaiFENesin (MUCINEX) 600 MG 12 hr tablet Take 1,200 mg by mouth 2 (two) times daily.      Marland Kitchen HYDROcodone-acetaminophen (NORCO) 5-325 MG per tablet Take 1 tablet by mouth every 4 (four) hours as needed. For pain      . metoprolol tartrate (LOPRESSOR) 25 MG tablet Take 12.5 mg by mouth 2 (two) times daily.      . nitroGLYCERIN (NITROSTAT) 0.4 MG SL tablet Place 0.4 mg under the tongue every 5 (five) minutes as needed. For chest pain      . prasugrel (EFFIENT) 10 MG TABS Take 10 mg by mouth daily.      . rosuvastatin (CRESTOR) 20 MG tablet Take 10 mg by mouth at bedtime.      . Vilazodone HCl (VIIBRYD) 40 MG TABS Take 40 mg by mouth daily.       Scheduled:   . sodium chloride   Intravenous STAT  . aspirin EC  81 mg Oral Daily  . atorvastatin  20 mg Oral  q1800  . clonazePAM  0.5 mg Oral BID  . guaiFENesin  600 mg Oral BID  . heparin  5,000 Units Subcutaneous Q8H  . metoprolol tartrate  12.5 mg Oral BID  . prasugrel  10 mg Oral Daily  . sodium chloride  3 mL Intravenous Q12H  . Tamsulosin HCl  0.4 mg Oral Daily    Review of Systems - General ROS: negative for - chills, fatigue, fever or hot flashes Hematological and Lymphatic ROS: negative for - bruising, fatigue, jaundice or pallor Endocrine ROS: negative for - hair pattern changes, hot flashes, mood swings or skin changes Respiratory ROS: negative for - cough, hemoptysis, orthopnea or wheezing Cardiovascular ROS: negative for - dyspnea on exertion, orthopnea, palpitations or shortness of breath Gastrointestinal ROS: negative for - abdominal pain, appetite loss, blood in stools, diarrhea or hematemesis Musculoskeletal ROS: negative for - joint pain, joint stiffness, joint swelling or muscle pain Neurological ROS: positive for - confusion Dermatological ROS: negative for dry skin, pruritus and rash   Blood pressure 164/87, pulse 112, temperature 97.9 F (36.6 C), temperature source Oral, resp. rate 18, height 5' 1.2" (1.554 m), weight 99.4 kg (219 lb 2.2 oz), SpO2 96.00%.   Neurologic Examination:   Mental Status: Alert, oriented to place , president but not year.  He has some difficulty following complex commands and shows decreased attention span. Speech fluent, but does make occasional paraphasic errors. He is unable to complete luria on either side. He is unable to spell world backwards.   He has a + palmomental on the right.   Cranial Nerves: II-Visual fields grossly intact. III/IV/VI-Extraocular movements intact.  Pupils reactive bilaterally. Ptosis not present. V/VII-Smile symmetric VIII-grossly intact IX/X-normal gag XI-bilateral shoulder shrug XII-midline tongue extension Motor: 5/5 bilaterally with normal tone and bulk. At rest I did not a tremor in his left foot.    Sensory: Pinprick and light touch intact throughout, bilaterally Deep Tendon Reflexes:  Right: Upper Extremity   Left: Upper extremity   biceps (C-5 to C-6) 2/4   biceps (C-5 to C-6) 2/4 tricep (C7) 2/4    triceps (C7) 2/4 Brachioradialis (C6) 2/4  Brachioradialis (C6) 2/4  Lower Extremity Lower Extremity  quadriceps (L-2 to L-4) 2/4   quadriceps (L-2 to L-4) 2/4 Achilles (S1) 2/4   Achilles (S1) 2/4      Plantars:  Right:  downgoing     Left:  Downgoing Cerebellar: Normal finger-to-nose, normal heel-to-shin test.     Lab Results  Component Value Date/Time   CHOL 126 08/16/2011 10:59 AM    Results for orders placed during the hospital encounter of 12/16/11 (from the past 48 hour(s))  GLUCOSE, CAPILLARY     Status: Abnormal   Collection Time   12/16/11  9:48 PM      Component Value Range Comment   Glucose-Capillary 110 (*) 70 - 99 mg/dL   CBC WITH DIFFERENTIAL     Status: Abnormal   Collection Time   12/16/11 10:39 PM      Component Value Range Comment   WBC 10.1  4.0 - 10.5 K/uL    RBC 4.46  4.22 - 5.81 MIL/uL    Hemoglobin 14.3  13.0 - 17.0 g/dL    HCT 14.7  82.9 - 56.2 %    MCV 93.5  78.0 - 100.0 fL    MCH 32.1  26.0 - 34.0 pg    MCHC 34.3  30.0 - 36.0 g/dL    RDW 13.0  86.5 - 78.4 %    Platelets 526 (*) 150 - 400 K/uL    Neutrophils Relative 61  43 - 77 %    Neutro Abs 6.1  1.7 - 7.7 K/uL    Lymphocytes Relative 27  12 - 46 %    Lymphs Abs 2.7  0.7 - 4.0 K/uL    Monocytes Relative 8  3 - 12 %    Monocytes Absolute 0.8  0.1 - 1.0 K/uL    Eosinophils Relative 5  0 - 5 %    Eosinophils Absolute 0.5  0.0 - 0.7 K/uL    Basophils Relative 1  0 - 1 %    Basophils Absolute 0.1  0.0 - 0.1 K/uL   COMPREHENSIVE METABOLIC PANEL     Status: Abnormal   Collection Time   12/16/11 10:39 PM      Component Value Range Comment   Sodium 135  135 - 145 mEq/L    Potassium 4.1  3.5 - 5.1 mEq/L    Chloride 100  96 - 112 mEq/L    CO2 25  19 - 32 mEq/L    Glucose, Bld 96  70 - 99  mg/dL    BUN 13  6 - 23 mg/dL    Creatinine, Ser 6.96  0.50 - 1.35 mg/dL    Calcium 9.6  8.4 - 29.5 mg/dL    Total Protein 8.5 (*) 6.0 - 8.3 g/dL    Albumin 3.2 (*) 3.5 - 5.2 g/dL    AST 25  0 - 37 U/L    ALT 26  0 - 53 U/L    Alkaline Phosphatase 74  39 - 117 U/L    Total Bilirubin 0.4  0.3 - 1.2 mg/dL    GFR calc non Af Amer 73 (*) >90 mL/min    GFR calc Af Amer 85 (*) >90 mL/min   ETHANOL     Status: Normal   Collection Time   12/16/11 10:39 PM      Component Value Range Comment   Alcohol, Ethyl (B) <11  0 - 11 mg/dL   APTT     Status: Abnormal   Collection Time   12/16/11 10:39 PM      Component Value Range Comment   aPTT 38 (*) 24 - 37 seconds   PROTIME-INR     Status: Normal   Collection Time  12/16/11 10:39 PM      Component Value Range Comment   Prothrombin Time 15.1  11.6 - 15.2 seconds    INR 1.17  0.00 - 1.49   AMMONIA     Status: Abnormal   Collection Time   12/16/11 10:46 PM      Component Value Range Comment   Ammonia <10 (*) 11 - 60 umol/L   URINE RAPID DRUG SCREEN (HOSP PERFORMED)     Status: Abnormal   Collection Time   12/17/11  2:33 AM      Component Value Range Comment   Opiates POSITIVE (*) NONE DETECTED    Cocaine NONE DETECTED  NONE DETECTED    Benzodiazepines POSITIVE (*) NONE DETECTED    Amphetamines NONE DETECTED  NONE DETECTED    Tetrahydrocannabinol NONE DETECTED  NONE DETECTED    Barbiturates NONE DETECTED  NONE DETECTED   URINALYSIS, ROUTINE W REFLEX MICROSCOPIC     Status: Normal   Collection Time   12/17/11  2:33 AM      Component Value Range Comment   Color, Urine YELLOW  YELLOW    APPearance CLEAR  CLEAR    Specific Gravity, Urine 1.022  1.005 - 1.030    pH 5.5  5.0 - 8.0    Glucose, UA NEGATIVE  NEGATIVE mg/dL    Hgb urine dipstick NEGATIVE  NEGATIVE    Bilirubin Urine NEGATIVE  NEGATIVE    Ketones, ur NEGATIVE  NEGATIVE mg/dL    Protein, ur NEGATIVE  NEGATIVE mg/dL    Urobilinogen, UA 0.2  0.0 - 1.0 mg/dL    Nitrite NEGATIVE  NEGATIVE     Leukocytes, UA NEGATIVE  NEGATIVE MICROSCOPIC NOT DONE ON URINES WITH NEGATIVE PROTEIN, BLOOD, LEUKOCYTES, NITRITE, OR GLUCOSE <1000 mg/dL.  COMPREHENSIVE METABOLIC PANEL     Status: Abnormal   Collection Time   12/17/11  8:51 AM      Component Value Range Comment   Sodium 132 (*) 135 - 145 mEq/L    Potassium 3.8  3.5 - 5.1 mEq/L    Chloride 96  96 - 112 mEq/L    CO2 24  19 - 32 mEq/L    Glucose, Bld 121 (*) 70 - 99 mg/dL    BUN 9  6 - 23 mg/dL    Creatinine, Ser 3.08  0.50 - 1.35 mg/dL    Calcium 9.2  8.4 - 65.7 mg/dL    Total Protein 8.2  6.0 - 8.3 g/dL    Albumin 3.3 (*) 3.5 - 5.2 g/dL    AST 23  0 - 37 U/L    ALT 23  0 - 53 U/L    Alkaline Phosphatase 78  39 - 117 U/L    Total Bilirubin 0.7  0.3 - 1.2 mg/dL    GFR calc non Af Amer 88 (*) >90 mL/min    GFR calc Af Amer >90  >90 mL/min   CBC     Status: Abnormal   Collection Time   12/17/11  8:51 AM      Component Value Range Comment   WBC 11.0 (*) 4.0 - 10.5 K/uL    RBC 4.27  4.22 - 5.81 MIL/uL    Hemoglobin 13.6  13.0 - 17.0 g/dL    HCT 84.6  96.2 - 95.2 %    MCV 92.0  78.0 - 100.0 fL    MCH 31.9  26.0 - 34.0 pg    MCHC 34.6  30.0 - 36.0 g/dL    RDW  13.9  11.5 - 15.5 %    Platelets 523 (*) 150 - 400 K/uL   TSH     Status: Normal   Collection Time   12/17/11  8:51 AM      Component Value Range Comment   TSH 3.192  0.350 - 4.500 uIU/mL   T4, FREE     Status: Normal   Collection Time   12/17/11  8:51 AM      Component Value Range Comment   Free T4 0.98  0.80 - 1.80 ng/dL     Ct Head Wo Contrast  12/16/2011  *RADIOLOGY REPORT*  Clinical Data: Altered mental status.  Difficulty with speech.  CT HEAD WITHOUT CONTRAST  Technique:  Contiguous axial images were obtained from the base of the skull through the vertex without contrast.  Comparison: None.  Findings: Mild diffuse cerebral atrophy.  Ventricular dilatation consistent with central atrophy.  No mass effect or midline shift. No abnormal extra-axial fluid collections.  Low  attenuation changes in the deep white matter consistent with patchy small vessel ischemic change.  Gray-white matter junctions are distinct.  Basal cisterns are not effaced.  No evidence of acute intracranial hemorrhage.  No depressed skull fractures.  Visualized paranasal sinuses and mastoid air cells are not opacified.  IMPRESSION: No acute intracranial abnormalities.   Original Report Authenticated By: Marlon Pel, M.D.    Mr Laqueta Jean Wo Contrast  12/17/2011  *RADIOLOGY REPORT*  Clinical Data: Altered mental status.  Difficulty with speech. Recent lung surgery.  MRI HEAD WITHOUT AND WITH CONTRAST  Technique:  Multiplanar, multiecho pulse sequences of the brain and surrounding structures were obtained according to standard protocol without and with intravenous contrast  Contrast: 20mL MULTIHANCE GADOBENATE DIMEGLUMINE 529 MG/ML IV SOLN  Comparison: 12/16/2011 head CT.  04/22/2009 brain MR  Findings: Motion degraded exam.  Artifact extends through the posterior frontal lobes on the diffusion sequence.  There are however, two tiny punctate left frontal lobe hyperintensities which may represent tiny acute infarct.  Small vessel disease type changes.  No intracranial hemorrhage.  Global atrophy.  Ventricular prominence probably related atrophy rather hydrocephalus and without change.  No intracranial mass lesion or abnormal enhancement noted on this motion degraded exam.  Major intracranial vascular structures are patent.  Left maxillary sinus mild polypoid opacification.  IMPRESSION: Exam limited by motion degradation.  Question two punctate infarcts left frontal lobe.  Please see above.   Original Report Authenticated By: Fuller Canada, M.D.    Dg Chest Port 1 View  12/16/2011  *RADIOLOGY REPORT*  Clinical Data: Altered mental status.  Recent VATS.  PORTABLE CHEST - 1 VIEW  Comparison: 12/06/2011.  Findings: Linear and patchy density at the right lung base with improvement.  Minimal right pleural fluid,  also improved.  The left lateral costophrenic angle is not currently included.  No visible pleural fluid on the left.  The included left lung is clear. Stable cervical spine fixation hardware.  IMPRESSION:  1.  Improving right basilar atelectasis and possible pneumonia. 2.  Small right pleural effusion, decreased.   Original Report Authenticated By: Darrol Angel, M.D.      Assessment/Plan:   71 YO male who underwent a VATS procedure 12/03/11.  Since the procedure he has had mild waxing and waning confusion but was noted to be severely and persistently confused 12/16/11.  MRI brain showed a small questionable infarct in left frontal lobe which would not be the cause of patients confusion.  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 Dr. Jerral Ralph discussed with Dr. [name] who stated this is a low grade maltoma. Etiology of confusion is not clear at this time, but possibilities include absence status, unwitnessed hypoxic insult that is manifested by the DWI changes on his MRI, delirium. Also, an encephalitis could give this picture as well.   Recommend:  1) EEG to check for encephalopathy/seizure focus.  2) Repeat MRI brain to confirm areas of infarction.  3) CTA head to look for vasculitis 4) B12, Folate 5) The combination of prasugrel + asa does make complications of lumbar puncture go up considerably. I have a low suspicion of bacterial disease in this gentleman, but it may be reasonable to treat empirically with acyclovir until a lumbar puncture can be done more safely if we are able to hold his prasugrel.    Felicie Morn PA-C [facility] [phone]  12/18/2011, 8:45 AM  Ritta Slot, MD [facility] [phone]

## 2011-12-18 NOTE — Progress Notes (Signed)
VASCULAR LAB PRELIMINARY  PRELIMINARY  PRELIMINARY  PRELIMINARY    Carotid duplex completed.    Preliminary report:  Right - 40% to 59% ICA stenosis mid to upper end of range. Vertebral artery flow is antegrade. Left - No evidence of ICA  Stenosis. Vertebral artery -  No color flow or Doppler signal noted however technically difficult due to movement of the patient.  Espiridion Supinski, RVS 12/18/2011, 11:58 AM

## 2011-12-18 NOTE — Procedures (Signed)
History: 71 yo M with confusion.   Sedation: None  Background: The background consists of rhythmic alpha with superimposed irregular delta and theta activity. There is anterior shiftng of his PDR present throughout much of the study.   EEG Diagnosis: 1) Generalized irregular delta and theta activity.   Clinical Interpretation: This abnormal EEG is consistent with a mild non-specific generalized cerebral dysfunction(encephalopathy). There was no seizure or seizure predisposition recorded on this study.   Ritta Slot, MD [facility] [phone]

## 2011-12-18 NOTE — Progress Notes (Signed)
Pt gave permission for use af aromatherapy for agitation and anxiety. Gave pt a cotton ball with 2 drops of pure lavender essential for him to inhale. Pt said he felt more relaxed. Will continue to monitor.   Peter Congo RN

## 2011-12-18 NOTE — Care Management Note (Unsigned)
    Page 1 of 1   12/18/2011     3:22:31 PM   CARE MANAGEMENT NOTE 12/18/2011  Patient:  Tyler Mayo, Tyler Mayo   Account Number:  [id number]  Date Initiated:  12/18/2011  Documentation initiated by:  Letha Cape  Subjective/Objective Assessment:   dx depression, cva  admit- lives with spouse.     Action/Plan:   pt eval-   Anticipated DC Date:  12/21/2011   Anticipated DC Plan:  HOME/SELF CARE      DC Planning Services  CM consult      Choice offered to / List presented to:             Status of service:  In process, will continue to follow Medicare Important Message given?   (If response is "NO", the following Medicare IM given date fields will be blank) Date Medicare IM given:   Date Additional Medicare IM given:    Discharge Disposition:    Per UR Regulation:  Reviewed for med. necessity/level of care/duration of stay  If discussed at Long Length of Stay Meetings, dates discussed:    Comments:  12/18/11 15:21 Letha Cape RN, BSN [phone] patient lives with spoue, await pt eval, patient has transportation and medication coverage, NCM will continue to follow for dc needs.

## 2011-12-18 NOTE — Progress Notes (Signed)
EEG COMPLETED

## 2011-12-19 ENCOUNTER — Inpatient Hospital Stay (HOSPITAL_COMMUNITY): Payer: Medicare Other

## 2011-12-19 DIAGNOSIS — F411 Generalized anxiety disorder: Secondary | ICD-10-CM

## 2011-12-19 LAB — CBC
Hemoglobin: 13.3 g/dL (ref 13.0–17.0)
RBC: 4.28 MIL/uL (ref 4.22–5.81)
WBC: 8.7 10*3/uL (ref 4.0–10.5)

## 2011-12-19 LAB — BASIC METABOLIC PANEL
GFR calc Af Amer: >90 mL/min (ref >90)
GFR calc non Af Amer: 85 mL/min — ABNORMAL LOW (ref >90)
Potassium: 3.7 mEq/L (ref 3.5–5.1)
Sodium: 137 mEq/L (ref 135–145)

## 2011-12-19 LAB — RPR: RPR Ser Ql: NONREACTIVE

## 2011-12-19 MED ORDER — METOPROLOL TARTRATE 25 MG PO TABS
25.0000 mg | ORAL_TABLET | Freq: Two times a day (BID) | ORAL | Status: DC
  Administered 2011-12-19 – 2011-12-21 (×4): 25 mg via ORAL
  Filled 2011-12-19 (×8): qty 1

## 2011-12-19 MED ORDER — METHYLPREDNISOLONE SODIUM SUCC 125 MG IJ SOLR
125.0000 mg | Freq: Two times a day (BID) | INTRAMUSCULAR | Status: DC
  Administered 2011-12-19 – 2011-12-20 (×3): 125 mg via INTRAVENOUS
  Filled 2011-12-19 (×6): qty 2

## 2011-12-19 MED ORDER — IOHEXOL 350 MG/ML SOLN
50.0000 mL | Freq: Once | INTRAVENOUS | Status: AC | PRN
  Administered 2011-12-19: 50 mL via INTRAVENOUS

## 2011-12-19 MED ORDER — CLONAZEPAM 0.5 MG PO TABS: 0.5000 mg | ORAL_TABLET | Freq: Two times a day (BID) | ORAL | Status: DC | PRN

## 2011-12-19 MED ORDER — INFLUENZA VIRUS VACC SPLIT PF IM SUSP
0.5000 mL | INTRAMUSCULAR | Status: AC
Start: 2011-12-20 — End: 2011-12-21
  Filled 2011-12-19: qty 0.5

## 2011-12-19 MED ORDER — BACITRACIN-NEOMYCIN-POLYMYXIN OINTMENT TUBE
TOPICAL_OINTMENT | Freq: Two times a day (BID) | CUTANEOUS | Status: DC
  Administered 2011-12-19 – 2011-12-20 (×2): via TOPICAL
  Administered 2011-12-21: 1 via TOPICAL
  Administered 2011-12-21 – 2011-12-23 (×2): via TOPICAL
  Filled 2011-12-19: qty 15

## 2011-12-19 NOTE — Progress Notes (Signed)
Informed of patient's admission  He had a right VATS, wedge resection of a right middle lobe mass a couple of weeks ago. Pathology was complex and was sent out for expert review. They feel it is most likely a low grade MALT lymphoma.  He was readmitted with altered mental status, the etiology which remains mysterious. He apparently is improving, he did recognize me immediately and remembered my first and last name.  I don't have anything to add to the comprehensive workup that has been done.  He does have some mild erythema at his posterior incision and the old CT sites- will order neosporin ointment for those areas  Thank you

## 2011-12-19 NOTE — Progress Notes (Signed)
Subjective: Wife and father-in-law in the room during evaluation this morning.  All questions addressed.  Patient still has not returned to baseline but by report of the wife he had some signs of confusion that were minor even prior to surgery.  Films have been reviewed.  No evidence of acute infarcts on MRI of the brain.  CTA shows no evidence of significant stenosis nor evidence of a vasculitis.  Etiology of mental status changes remains unclear.  Patient does have a significant history of depression.   EEG only significant for slowing.    Objective: Current vital signs: BP 137/75  Pulse 116  Temp 97.7 F (36.5 C) (Oral)  Resp 16  Ht 5\' 10"  (1.778 m)  Wt 99.4 kg (219 lb 2.2 oz)  BMI 31.44 kg/m2  SpO2 95% Vital signs in last 24 hours: Temp:  [97.5 F (36.4 C)-98 F (36.7 C)] 97.7 F (36.5 C) (09/11 1121) Pulse Rate:  [106-124] 116  (09/11 1121) Resp:  [16-20] 16  (09/11 1121) BP: (137-156)/(74-86) 137/75 mmHg (09/11 1121) SpO2:  [94 %-98 %] 95 % (09/11 1121)  Intake/Output from previous day: 09/10 0701 - 09/11 0700 In: 1173 [P.O.:720; I.V.:303; IV Piggyback:150] Out: -  Intake/Output this shift: Total I/O In: 360 [P.O.:360] Out: -  Nutritional status: Cardiac  Neurologic Exam: Mental Status: Alert, oriented, thought content appropriate.  Speech fluent without evidence of aphasia.  Although speech is fluent at times makes substitutions; ex. Asks his wife to put salt and pepper in his socks.  Later says he was asking for talcum powder to be placed in his socks.  Able to follow 3 step commands without difficulty.   Cranial Nerves: II: Discs flat bilaterally; Visual fields grossly normal, pupils equal, round, reactive to light and accommodation III,IV, VI: ptosis not present, extra-ocular motions intact bilaterally V,VII: smile symmetric, facial light touch sensation normal bilaterally VIII: hearing normal bilaterally IX,X: gag reflex present XI: bilateral shoulder  shrug XII: midline tongue extension Motor: Right : Upper extremity   5/5    Left:     Upper extremity   5/5  Lower extremity   5/5     Lower extremity   5/5 Tone and bulk:normal tone throughout; no atrophy noted.  Low amplitude, high frequency tremor noted in the feet bilaterally Sensory: Pinprick and light touch intact throughout, bilaterally Deep Tendon Reflexes: 2+ and symmetric throughout Plantars: Right: downgoing   Left: downgoing Cerebellar: normal finger-to-nose and normal heel-to-shin test normal gait and station    Lab Results: Basic Metabolic Panel:  Lab 12/19/11 1610 12/17/11 0851 12/16/11 2239  NA 137 132* 135  K 3.7 3.8 4.1  CL 102 96 100  CO2 25 24 25   GLUCOSE 101* 121* 96  BUN 6 9 13   CREATININE 0.91 0.83 1.02  CALCIUM 9.2 9.2 9.6  MG -- -- --  PHOS -- -- --    Liver Function Tests:  Lab 12/17/11 0851 12/16/11 2239  AST 23 25  ALT 23 26  ALKPHOS 78 74  BILITOT 0.7 0.4  PROT 8.2 8.5*  ALBUMIN 3.3* 3.2*   No results found for this basename: LIPASE:5,AMYLASE:5 in the last 168 hours  Lab 12/16/11 2246  AMMONIA <10*    CBC:  Lab 12/19/11 0725 12/17/11 0851 12/16/11 2239  WBC 8.7 11.0* 10.1  NEUTROABS -- -- 6.1  HGB 13.3 13.6 14.3  HCT 39.2 39.3 41.7  MCV 91.6 92.0 93.5  PLT 537* 523* 526*    Cardiac Enzymes: No results found  for this basename: CKTOTAL:5,CKMB:5,CKMBINDEX:5,TROPONINI:5 in the last 168 hours  Lipid Panel: No results found for this basename: CHOL:5,TRIG:5,HDL:5,CHOLHDL:5,VLDL:5,LDLCALC:5 in the last 168 hours  CBG:  Lab 12/16/11 2148  GLUCAP 110*    Microbiology: Results for orders placed during the hospital encounter of 11/29/11  SURGICAL PCR SCREEN     Status: Normal   Collection Time   11/29/11  3:36 PM      Component Value Range Status Comment   MRSA, PCR NEGATIVE  NEGATIVE Final    Staphylococcus aureus NEGATIVE  NEGATIVE Final     Coagulation Studies:  Basename 12/16/11 2239  LABPROT 15.1  INR 1.17     Imaging: Ct Angio Head W/cm &/or Wo Cm  12/19/2011  *RADIOLOGY REPORT*  Clinical Data:  71 year old male with altered mental status, abnormal speech, dizziness.  CT ANGIOGRAPHY HEAD  Technique:  Multidetector CT imaging of the head was performed using the standard protocol during bolus administration of intravenous contrast.  Multiplanar CT image reconstructions including MIPs were obtained to evaluate the vascular anatomy.  Contrast: 50mL OMNIPAQUE IOHEXOL 350 MG/ML SOLN  Comparison:  Brain MRIs 0949 hours the same day and 12/17/2011. Head CT without contrast 12/16/2011.  Findings:  Lower cervical ACDF hardware on the scout view.  Stable cerebral volume. Stable ventricle size and configuration. No midline shift, mass effect, or evidence of mass lesion.  No acute intracranial hemorrhage identified.  No evidence of cortically based acute infarction identified.  No abnormal enhancement identified.  No acute osseous abnormality identified. Visualized paranasal sinuses and mastoids are clear.  Vascular Findings: Major intracranial venous structures are enhancing.  Codominant distal vertebral arteries are patent.  Normal PICA vessels.  Normal vertebrobasilar junction.  No basilar artery stenosis.  SCA and PCA origins are within normal limits.  Small bilateral posterior communicating arteries.  Bilateral PCA branches are within normal limits.  Mild to moderate tortuosity of the distal cervical right ICA. Patent right ICA siphon with minimal atherosclerosis.  Normal right ophthalmic artery origin.  Normal right posterior communicating artery origin. Patent left ICA siphon with mild intermittent calcified plaque.  No stenosis.  Left ophthalmic and posterior communicating artery origins are normal.  The normal carotid termini.  Normal MCA and ACA origins.  Normal anterior communicating artery.  Bilateral ACA branches are within normal limits.  Bilateral MCA branches are within normal limits.   Review of the MIP  images confirms the above findings.  IMPRESSION: 1.  Normal for age intracranial CTA. 2. No acute intracranial abnormality.   Original Report Authenticated By: Harley Hallmark, M.D.    Mr Brain Wo Contrast  12/19/2011  *RADIOLOGY REPORT*  Clinical Data: 71 year old male with altered mental status, abnormal speech.  MRI HEAD WITHOUT CONTRAST  Technique:  Multiplanar, multiecho pulse sequences of the brain and surrounding structures were obtained according to standard protocol without intravenous contrast.  Comparison: Brain MRI 12/17/2011 and earlier.  Findings: Similar degree of motion artifact intermittently noted throughout today's exam.  Mildly heterogeneous diffusion with no convincing diffusion restriction to suggest acute infarct.  The previous to foci do not seem to persist.  Major intracranial vascular flow voids are stable.  No midline shift, ventriculomegaly, mass effect, evidence of mass lesion, extra-axial collection or acute intracranial hemorrhage. Cervicomedullary junction and pituitary are within normal limits.  Stable gray and white matter signal, with mild for age scattered cerebral white matter T2 and FLAIR hyperintensity.  The negative visualized cervical spine.  Normal marrow signal.  Stable orbits. Visualized paranasal sinuses  and mastoids are clear. Negative scalp soft tissues.  IMPRESSION: No convincing acute infarct or acute intracranial abnormality.   Original Report Authenticated By: Harley Hallmark, M.D.    Mr Laqueta Jean Wo Contrast  12/17/2011  *RADIOLOGY REPORT*  Clinical Data: Altered mental status.  Difficulty with speech. Recent lung surgery.  MRI HEAD WITHOUT AND WITH CONTRAST  Technique:  Multiplanar, multiecho pulse sequences of the brain and surrounding structures were obtained according to standard protocol without and with intravenous contrast  Contrast: 20mL MULTIHANCE GADOBENATE DIMEGLUMINE 529 MG/ML IV SOLN  Comparison: 12/16/2011 head CT.  04/22/2009 brain MR  Findings:  Motion degraded exam.  Artifact extends through the posterior frontal lobes on the diffusion sequence.  There are however, two tiny punctate left frontal lobe hyperintensities which may represent tiny acute infarct.  Small vessel disease type changes.  No intracranial hemorrhage.  Global atrophy.  Ventricular prominence probably related atrophy rather hydrocephalus and without change.  No intracranial mass lesion or abnormal enhancement noted on this motion degraded exam.  Major intracranial vascular structures are patent.  Left maxillary sinus mild polypoid opacification.  IMPRESSION: Exam limited by motion degradation.  Question two punctate infarcts left frontal lobe.  Please see above.   Original Report Authenticated By: Fuller Canada, M.D.     Medications:  I have reviewed the patient's current medications. Scheduled:   . acyclovir  10 mg/kg (Ideal) Intravenous Q8H  . aspirin EC  81 mg Oral Daily  . atorvastatin  20 mg Oral q1800  . clonazePAM  0.5 mg Oral BID  . guaiFENesin  600 mg Oral BID  . heparin  5,000 Units Subcutaneous Q8H  . metoprolol tartrate  25 mg Oral BID  . sodium chloride  3 mL Intravenous Q12H  . Tamsulosin HCl  0.4 mg Oral Daily  . DISCONTD: metoprolol tartrate  12.5 mg Oral BID  . DISCONTD: prasugrel  10 mg Oral Daily    Assessment/Plan:  Patient Active Hospital Problem List:  Confusion with non-focal neuro exam (12/17/2011)   Assessment: Confusion remains.  Has had some equivocal autoimmune testing in the past.  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  Brain imaging does not point to an etiology for the confusion.  EEG only significant for slowing.  Although no focal mass lesion noted unclear if there could be some paraneoplastic syndrome at play.  Patient just recently with some manipulation of his anticoagulants and therefore would not LP at this time.  A trial of steroids may be helpful though, both diagnostically and therapeutically.     Plan:  1.   Solumedrol 125mg  q12hr.  Case discussed at length with Dr. Jerral Ralph.  Will continue to follow with you.       LOS: 3 days   Thana Farr, MD [facility] [phone] 12/19/2011  1:26 PM

## 2011-12-19 NOTE — Progress Notes (Signed)
Spoke with Dr Jerral Ralph regarding patient concerns about Heparin prior to Lumbar Puncture, and increasing anxiety. Pt Wife requesting an afternoon dose of pts klonapin, MD states ok to give heparin right now, and will change orders regarding anti-anxiety medications. Will continue to monitor

## 2011-12-19 NOTE — Progress Notes (Signed)
PATIENT DETAILS Name: Tyler Mayo Age: 71 y.o. Sex: male Date of Birth: 1940/08/07 Admit Date: 12/16/2011 Admitting Physician Huey Bienenstock, MD ZOX:[id number] ALLEN, MD  Subjective: Essentially unchanged-still confused  Assessment/Plan: Active Problems: AMS -mostly in the form of confusion -he however does answer a lot of questions appropriately -per wife-this was sudden in onset, when he woke up 9/8-he was in his usual state of health, but retrospectively-she does state that he did have intermittent periods of confusion since his discharge from the VAT's procedure -etiology of this confusion remains unclear-Dr Amada Jupiter recommended empiric treatment Acyclovir, patient getting a repeat MRI brain and CT Angio of brain today. Unable to do LP as on both ASA and Effient, Effient stopped 9/10-in case we need to do LP in the near future. - Spoke with Dr. Dierdre Forth 9/10-patient's rheumatologist-case discussed including prior positive autoimmune markers-per Dr. Dierdre Forth these markers were subsequently negative when he repeated them. He does not think that the patient's current presentation is consistent with autoimmune disease.  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   Gen Exam: Awake and Mostly alert with clear speech.   Neck: Supple, No JVD.  Chest: B/L Clear.  No rales CVS: S1 S2 Regular, no murmurs.  Abdomen: soft, BS +, non tender, non distended.  Extremities: no edema, lower extremities warm to touch. Neurologic: Non Focal.   Skin: No Rash.   Wounds: N/A.    Intake/Output from previous day:  Intake/Output Summary (Last 24 hours) at 12/19/11 0959 Last data filed at 12/19/11 0829  Gross per 24 hour  Intake   1293 ml  Output      0 ml  Net   1293 ml     LAB RESULTS: CBC  Lab 12/19/11 0725 12/17/11 0851 12/16/11 2239  WBC 8.7 11.0* 10.1  HGB 13.3 13.6 14.3  HCT 39.2 39.3 41.7  PLT 537* 523* 526*  MCV 91.6 92.0 93.5  MCH 31.1 31.9 32.1  MCHC 33.9 34.6 34.3  RDW 13.9 13.9 14.0  LYMPHSABS -- -- 2.7  MONOABS -- -- 0.8  EOSABS -- -- 0.5  BASOSABS -- -- 0.1  BANDABS -- -- --    Chemistries   Lab 12/19/11 0725 12/17/11 0851 12/16/11 2239  NA 137 132* 135  K 3.7 3.8 4.1  CL 102 96 100  CO2 25 24 25  GLUCOSE 101* 121* 96  BUN 6 9 13   CREATININE 0.91 0.83 1.02  CALCIUM 9.2 9.2 9.6  MG -- -- --    CBG:  Lab 12/16/11 2148  GLUCAP 110*    GFR Estimated Creatinine Clearance: 91.9 ml/min (by C-G formula based on Cr of 0.91).  Coagulation profile  Lab 12/16/11 2239  INR 1.17  PROTIME --    Cardiac Enzymes No results found for this basename: CK:3,CKMB:3,TROPONINI:3,MYOGLOBIN:3 in the last 168 hours  No components found with this basename: POCBNP:3 No results found for this basename: DDIMER:2 in the last 72 hours No results found for this basename: HGBA1C:2 in the last 72 hours No results found for this  basename: CHOL:2,HDL:2,LDLCALC:2,TRIG:2,CHOLHDL:2,LDLDIRECT:2 in the last 72 hours  Basename 12/17/11 0851  TSH 3.192  T4TOTAL --  T3FREE --  THYROIDAB --    Basename 12/18/11 1030  VITAMINB12 474  FOLATE 16.7  FERRITIN --  TIBC --  IRON --  RETICCTPCT --   No results found for this basename: LIPASE:2,AMYLASE:2 in the last 72 hours  Urine Studies No results found for this basename: UACOL:2,UAPR:2,USPG:2,UPH:2,UTP:2,UGL:2,UKET:2,UBIL:2,UHGB:2,UNIT:2,UROB:2,ULEU:2,UEPI:2,UWBC:2,URBC:2,UBAC:2,CAST:2,CRYS:2,UCOM:2,BILUA:2 in the last 72 hours  MICROBIOLOGY: No results found for this or any previous visit (from the past 240 hour(s)).  RADIOLOGY STUDIES/RESULTS: Dg Chest 2 View  12/06/2011  *RADIOLOGY REPORT*  Clinical Data: Right lung mass, status post VATS  CHEST - 2 VIEW  Comparison: 12/05/2011  Findings: Patchy right lower lobe opacity, possibly atelectasis. Small bilateral pleural effusions.  No pneumothorax.  The heart is mildly enlarged, unchanged.  Mild degenerative changes of the visualized thoracolumbar spine. Cervical spine fixation hardware.  IMPRESSION: No pneumothorax is seen.  Suspected right lower lobe atelectasis with small bilateral pleural effusions.   Original Report Authenticated By: Charline Bills, M.D.    Dg Chest 2 View  12/03/2011  *RADIOLOGY REPORT*  Clinical Data: Preop radiograph.  CHEST - 2 VIEW  Comparison: 05/12/2011  Findings: Heart size and mediastinal contours are unremarkable.  No pleural effusion or pulmonary edema identified.  No airspace consolidation identified.  IMPRESSION:  1.  No acute cardiopulmonary abnormalities.   Original Report Authenticated By: Rosealee Albee, M.D.    Ct Head Wo Contrast  12/16/2011  *RADIOLOGY REPORT*  Clinical Data: Altered mental status.  Difficulty with speech.  CT HEAD WITHOUT CONTRAST  Technique:  Contiguous axial images were obtained from the base of the skull through the vertex without contrast.  Comparison: None.   Findings: Mild diffuse cerebral atrophy.  Ventricular dilatation consistent with central atrophy.  No mass effect or midline shift. No abnormal extra-axial fluid collections.  Low attenuation changes in the deep white matter consistent with patchy small vessel ischemic change.  Gray-white matter junctions are distinct.  Basal cisterns are not effaced.  No evidence of acute intracranial hemorrhage.  No depressed skull fractures.  Visualized paranasal sinuses and mastoid air cells are not opacified.  IMPRESSION: No acute intracranial abnormalities.   Original Report Authenticated By: Marlon Pel, M.D.    Dg Chest Port 1 View  12/16/2011  *RADIOLOGY REPORT*  Clinical Data: Altered mental status.  Recent VATS.  PORTABLE CHEST - 1 VIEW  Comparison: 12/06/2011.  Findings: Linear and patchy density at the right lung base with improvement.  Minimal right pleural fluid, also improved.  The left lateral costophrenic angle is not currently included.  No visible pleural fluid on the left.  The included left lung is clear. Stable cervical spine fixation hardware.  IMPRESSION:  1.  Improving right basilar  atelectasis and possible pneumonia. 2.  Small right pleural effusion, decreased.   Original Report Authenticated By: Darrol Angel, M.D.    Dg Chest Port 1 View  12/05/2011  *RADIOLOGY REPORT*  Clinical Data: Chest tube removal  PORTABLE CHEST - 1 VIEW  Comparison: Chest radiograph 11/27/2011 at 6:20 hours  Findings: Interval removal of right chest tube.  No evidence of pneumothorax.  There is persistent bibasilar atelectasis,  right greater on the right.  Stable enlarged heart silhouette.  IMPRESSION: No evidence of pneumothorax following right chest tube removal.  Bibasilar atelectasis.   Original Report Authenticated By: Genevive Bi, M.D.    Dg Chest Port 1 View  12/05/2011  *RADIOLOGY REPORT*  Clinical Data: Lung mass, VATS.  PORTABLE CHEST - 1 VIEW  Comparison: 12/04/2011 and CT chest 11/13/2011.   Findings: Trachea is midline.  Right IJ central line tip projects over the SVC.  Right chest tube remains in place at the apex of the right hemithorax.  Heart is enlarged, stable. Postoperative changes in the right hemithorax.  Lungs are low volume with diffuse interstitial prominence and indistinctness.  No pneumothorax. Bibasilar air space disease, right greater than left.  Possible tiny right pleural effusion.  IMPRESSION:  1.  No pneumothorax with right chest tube in place. 2.  Postoperative changes in the right hemithorax with stable right basilar airspace disease and possible tiny right pleural effusion. 3.  Mild diffuse interstitial prominence may be due to vascular crowding.  Pulmonary edema cannot be excluded. 4.  Left basilar airspace disease, stable.   Original Report Authenticated By: Reyes Ivan, M.D.    Dg Chest Port 1 View  12/04/2011  *RADIOLOGY REPORT*  Clinical Data: Chest tube placement.  Shortness of breath.  PORTABLE CHEST - 1 VIEW  Comparison: Chest x-ray 12/03/2011.  Findings: There is a right-sided internal jugular central venous catheter with tip terminating in the distal superior vena cava. Right-sided chest tube in position with tip and sideport projecting over the right hemithorax.  No appreciable pneumothorax is identified on today's examination.  Lung volumes are low with bibasilar linear opacities favored to reflect areas of subsegmental atelectasis.  Postoperative changes of right middle lobe wedge resection are again noted with persistent ill-defined opacity in this region, likely reflects a resolving postoperative hemorrhage and atelectasis.  Pulmonary vascular crowding accentuated by low lung volumes, without frank pulmonary edema.  Heart size is borderline enlarged (likely accentuated by low lung volumes). Mediastinal contours appear widened, likely accentuated by low lung volumes, rightward rotation and lordotic positioning.  IMPRESSION: 1.  Persistent low lung volumes  with bibasilar areas of atelectasis and postoperative changes in the right middle lobe, as above. 2.  Support apparatus, as above.   Original Report Authenticated By: Florencia Reasons, M.D.    Dg Chest Portable 1 View  12/03/2011  *RADIOLOGY REPORT*  Clinical Data: Postop.  PORTABLE CHEST - 1 VIEW  Comparison: Earlier today.  Findings: Interval right chest tube with its tip at the medial right lung apex.  No pneumothorax.  Interval right jugular catheter tip in the superior vena cava.  Interval linear densities in both lower lung zones with a decreased inspiration.  The cardiac silhouette currently appears mildly enlarged, magnified by the decreased inspiration.  Cervical spine fixation hardware is unchanged.  IMPRESSION:  1.  Poor inspiration with mild bibasilar atelectasis. 2.  Right chest tube without pneumothorax.   Original Report Authenticated By: Darrol Angel, M.D.     MEDICATIONS: Scheduled Meds:    .  acyclovir  10 mg/kg (Ideal) Intravenous Q8H  . aspirin EC  81 mg Oral Daily  . atorvastatin  20 mg Oral q1800  . clonazePAM  0.5 mg Oral BID  . guaiFENesin  600 mg Oral BID  . heparin  5,000 Units Subcutaneous Q8H  . metoprolol tartrate  25 mg Oral BID  . sodium chloride  3 mL Intravenous Q12H  . Tamsulosin HCl  0.4 mg Oral Daily  . DISCONTD: metoprolol tartrate  12.5 mg Oral BID  . DISCONTD: prasugrel  10 mg Oral Daily   Continuous Infusions:    . sodium chloride 100 mL/hr at 12/19/11 0743   PRN Meds:.acetaminophen, acetaminophen, alum & mag hydroxide-simeth, clonazePAM, haloperidol lactate, nitroGLYCERIN, ondansetron (ZOFRAN) IV, ondansetron, polyethylene glycol  Antibiotics: Anti-infectives     Start     Dose/Rate Route Frequency Ordered Stop   12/18/11 2000   acyclovir (ZOVIRAX) 730 mg in dextrose 5 % 150 mL IVPB        10 mg/kg  73 kg (Ideal) 164.6 mL/hr over 60 Minutes Intravenous 3 times per day 12/18/11 1849     12/17/11 1000   doxycycline (VIBRAMYCIN) 50 MG  capsule 50 mg  Status:  Discontinued     Comments: Use for Viibryd 40 mg      50 mg Oral Daily 12/17/11 0237 12/17/11 0854           Jeoffrey Massed, MD  [facility] Pager:[phone]  If 7PM-7AM, please contact night-coverage [email or web address] Password [email or web address] 12/19/2011, 9:59 AM   LOS: 3 days

## 2011-12-19 NOTE — Progress Notes (Signed)
Patient and family refused PM Atorvastatin, states that the medication gives him joint and muscle soreness.

## 2011-12-20 LAB — BASIC METABOLIC PANEL
GFR calc Af Amer: >90 mL/min (ref >90)
GFR calc non Af Amer: 90 mL/min — ABNORMAL LOW (ref >90)
Potassium: 3.6 mEq/L (ref 3.5–5.1)
Sodium: 136 mEq/L (ref 135–145)

## 2011-12-20 MED ORDER — CLONAZEPAM 1 MG PO TABS
1.0000 mg | ORAL_TABLET | Freq: Every evening | ORAL | Status: DC | PRN
  Administered 2011-12-22 – 2011-12-23 (×2): 1 mg via ORAL
  Filled 2011-12-20 (×2): qty 1

## 2011-12-20 MED ORDER — DIPHENHYDRAMINE HCL 25 MG PO CAPS: 25.0000 mg | ORAL_CAPSULE | Freq: Every evening | ORAL | Status: DC | PRN

## 2011-12-20 MED ORDER — ROSUVASTATIN CALCIUM 10 MG PO TABS
10.0000 mg | ORAL_TABLET | Freq: Every day | ORAL | Status: DC
  Filled 2011-12-20 (×5): qty 1

## 2011-12-20 NOTE — Clinical Documentation Improvement (Signed)
GENERIC DOCUMENTATION CLARIFICATION QUERY  THIS DOCUMENT IS NOT A PERMANENT PART OF THE MEDICAL RECORD  TO RESPOND TO THE THIS QUERY, FOLLOW THE INSTRUCTIONS BELOW:  1. If needed, update documentation for the patient's encounter via the notes activity.  2. Access this query again and click edit on the In [employer].  3. After updating, or not, click F2 to complete all highlighted (required) fields concerning your review. Select "additional documentation in the medical record" OR "no additional documentation provided".  4. Click Sign note button.  5. The deficiency will fall out of your In Basket *Please let us know if you are not able to complete this workflow by phone or e-mail (listed below).  Please update your documentation within the medical record to reflect your response to this query.                                                                                        12/20/11   Dear Dr. Werner [facility],  In a better effort to capture your patient's severity of illness, reflect appropriate length of stay and utilization of resources, a review of the patient medical record has revealed the following indicators.    Based on your clinical judgment, please clarify and document in a progress note and/or discharge summary the clinical condition associated with the following supporting information:  In responding to this query please exercise your independent judgment.  The fact that a query is asked, does not imply that any particular answer is desired or expected.  Pt receiving Acyclovir 730mg  IVPB every 8hrs.  If possible, please document the possible, probable or suspected condition that is being treated in Progress Notes and Discharge Summary.  Possible Clinical Conditions   Other Condition  Cannot Clinically Determine    You may use possible, probable, or suspect with inpatient documentation. possible, probable, suspected diagnoses MUST be documented  at the time of discharge  Reviewed: Query was addressed  Thank You,    Rossie Muskrat RN, BSN  Clinical Documentation Specialist Pager:  [phone] [email or web address]  Health Information Management [place]

## 2011-12-20 NOTE — Progress Notes (Signed)
PATIENT DETAILS Name: Tyler Mayo Age: 71 y.o. Sex: male Date of Birth: Nov 27, 1940 Admit Date: 12/16/2011 Admitting Physician Huey Bienenstock, MD ZOX:[id number] Freida Busman, MD  Subjective: More emotional-but seems to be inquiring what wrong with him-will I ever bet better?  Assessment/Plan: Active Problems: AMS -mostly in the form of confusion -he however does answer a lot of questions appropriately -per wife-this was sudden in onset, when he woke up 9/8-he was in his usual state of health, but retrospectively-she does state that he did have intermittent periods of confusion since his discharge from the VAT's procedure -etiology of this confusion remains unclear-but on empiric trial of steroids and Acyclovir -Unable to do LP as on both ASA and Effient, Effient stopped 9/10-in case we need to do LP in the near future. - Spoke with Dr. Dierdre Forth 9/10-patient's rheumatologist-case discussed including prior positive autoimmune markers-per Dr. Dierdre Forth these markers were subsequently negative when he repeated them. He does not think that the patient's current presentation is consistent with autoimmune disease.  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   Gen Exam: Awake and Mostly alert with clear speech.   Neck: Supple, No JVD.  Chest: B/L Clear.  No rales CVS: S1 S2 Regular, no murmurs.  Abdomen: soft, BS +, non tender, non distended.  Extremities: no edema, lower extremities warm to touch. Neurologic: Non Focal.   Skin: No Rash.   Wounds: N/A.    Intake/Output from previous day:  Intake/Output Summary (Last 24 hours) at 12/20/11 1300 Last data filed at 12/20/11 1010  Gross per 24 hour  Intake 3938.33 ml  Output      0 ml  Net 3938.33 ml     LAB RESULTS: CBC  Lab 12/19/11 0725 12/17/11 0851 12/16/11 2239  WBC 8.7 11.0* 10.1  HGB 13.3 13.6 14.3  HCT 39.2 39.3 41.7  PLT 537* 523* 526*  MCV 91.6 92.0 93.5  MCH 31.1 31.9 32.1  MCHC 33.9 34.6 34.3  RDW 13.9 13.9 14.0  LYMPHSABS -- -- 2.7  MONOABS -- -- 0.8  EOSABS -- -- 0.5  BASOSABS -- -- 0.1  BANDABS -- -- --    Chemistries   Lab 12/20/11 0555 12/19/11 0725 12/17/11 0851 12/16/11 2239  NA 136 137  132* 135  K 3.6 3.7 3.8 4.1  CL 101 102 96 100  CO2 24 25 24 25   GLUCOSE 145* 101* 121* 96  BUN 8 6 9 13   CREATININE 0.80 0.91 0.83 1.02  CALCIUM 9.3 9.2 9.2 9.6  MG -- -- -- --    CBG:  Lab 12/16/11 2148  GLUCAP 110*    GFR Estimated Creatinine Clearance: 104.5 ml/min (by C-G formula based on Cr of 0.8).  Coagulation profile  Lab 12/16/11 2239  INR 1.17  PROTIME --    Cardiac Enzymes No results found for this basename: CK:3,CKMB:3,TROPONINI:3,MYOGLOBIN:3 in the last 168 hours  No components found with this basename: POCBNP:3 No results  found for this basename: DDIMER:2 in the last 72 hours No results found for this basename: HGBA1C:2 in the last 72 hours No results found for this basename: CHOL:2,HDL:2,LDLCALC:2,TRIG:2,CHOLHDL:2,LDLDIRECT:2 in the last 72 hours No results found for this basename: TSH,T4TOTAL,FREET3,T3FREE,THYROIDAB in the last 72 hours  Basename 12/18/11 1030  VITAMINB12 474  FOLATE 16.7  FERRITIN --  TIBC --  IRON --  RETICCTPCT --   No results found for this basename: LIPASE:2,AMYLASE:2 in the last 72 hours  Urine Studies No results found for this basename: UACOL:2,UAPR:2,USPG:2,UPH:2,UTP:2,UGL:2,UKET:2,UBIL:2,UHGB:2,UNIT:2,UROB:2,ULEU:2,UEPI:2,UWBC:2,URBC:2,UBAC:2,CAST:2,CRYS:2,UCOM:2,BILUA:2 in the last 72 hours  MICROBIOLOGY: No results found for this or any previous visit (from the past 240 hour(s)).  RADIOLOGY STUDIES/RESULTS: Dg Chest 2 View  12/06/2011  *RADIOLOGY REPORT*  Clinical Data: Right lung mass, status post VATS  CHEST - 2 VIEW  Comparison: 12/05/2011  Findings: Patchy right lower lobe opacity, possibly atelectasis. Small bilateral pleural effusions.  No pneumothorax.  The heart is mildly enlarged, unchanged.  Mild degenerative changes of the visualized thoracolumbar spine. Cervical spine fixation hardware.  IMPRESSION: No pneumothorax is seen.  Suspected right lower lobe atelectasis with small bilateral pleural effusions.   Original Report Authenticated By: Charline Bills, M.D.    Dg Chest 2 View  12/03/2011  *RADIOLOGY REPORT*  Clinical Data: Preop radiograph.  CHEST - 2 VIEW  Comparison: 05/12/2011  Findings: Heart size and mediastinal contours are unremarkable.  No pleural effusion or pulmonary edema identified.  No airspace consolidation identified.  IMPRESSION:  1.  No acute cardiopulmonary abnormalities.   Original Report Authenticated By: Rosealee Albee, M.D.    Ct Head Wo Contrast  12/16/2011  *RADIOLOGY REPORT*  Clinical Data: Altered mental status.  Difficulty with  speech.  CT HEAD WITHOUT CONTRAST  Technique:  Contiguous axial images were obtained from the base of the skull through the vertex without contrast.  Comparison: None.  Findings: Mild diffuse cerebral atrophy.  Ventricular dilatation consistent with central atrophy.  No mass effect or midline shift. No abnormal extra-axial fluid collections.  Low attenuation changes in the deep white matter consistent with patchy small vessel ischemic change.  Gray-white matter junctions are distinct.  Basal cisterns are not effaced.  No evidence of acute intracranial hemorrhage.  No depressed skull fractures.  Visualized paranasal sinuses and mastoid air cells are not opacified.  IMPRESSION: No acute intracranial abnormalities.   Original Report Authenticated By: Marlon Pel, M.D.    Dg Chest Port 1 View  12/16/2011  *RADIOLOGY REPORT*  Clinical Data: Altered mental status.  Recent VATS.  PORTABLE CHEST - 1 VIEW  Comparison: 12/06/2011.  Findings: Linear and patchy density at the right lung base with improvement.  Minimal right pleural fluid, also improved.  The left lateral costophrenic angle is not currently included.  No visible pleural fluid on the left.  The included left lung is clear. Stable cervical spine fixation hardware.  IMPRESSION:  1.  Improving right basilar atelectasis and possible pneumonia. 2.  Small right pleural effusion, decreased.   Original Report Authenticated By: Darrol Angel, M.D.    Dg Chest Port 1 View  12/05/2011  *RADIOLOGY REPORT*  Clinical Data: Chest tube removal  PORTABLE CHEST - 1 VIEW  Comparison: Chest radiograph 11/27/2011 at 6:20 hours  Findings: Interval removal of right chest tube.  No evidence of pneumothorax.  There is persistent bibasilar atelectasis,  right greater on the right.  Stable enlarged heart silhouette.  IMPRESSION: No evidence of pneumothorax following right chest tube removal.  Bibasilar atelectasis.   Original Report Authenticated By: Genevive Bi, M.D.     Dg Chest Port 1 View  12/05/2011  *RADIOLOGY REPORT*  Clinical Data: Lung mass, VATS.  PORTABLE CHEST - 1 VIEW  Comparison: 12/04/2011 and CT chest 11/13/2011.  Findings: Trachea is midline.  Right IJ central line tip projects over the SVC.  Right chest tube remains in place at the apex of the right hemithorax.  Heart is enlarged, stable. Postoperative changes in the right hemithorax.  Lungs are low volume with diffuse interstitial prominence and indistinctness.  No pneumothorax. Bibasilar air space disease, right greater than left.  Possible tiny right pleural effusion.  IMPRESSION:  1.  No pneumothorax with right chest tube in place. 2.  Postoperative changes in the right hemithorax with stable right basilar airspace disease and possible tiny right pleural effusion. 3.  Mild diffuse interstitial prominence may be due to vascular crowding.  Pulmonary edema cannot be excluded. 4.  Left basilar airspace disease, stable.   Original Report Authenticated By: Reyes Ivan, M.D.    Dg Chest Port 1 View  12/04/2011  *RADIOLOGY REPORT*  Clinical Data: Chest tube placement.  Shortness of breath.  PORTABLE CHEST - 1 VIEW  Comparison: Chest x-ray 12/03/2011.  Findings: There is a right-sided internal jugular central venous catheter with tip terminating in the distal superior vena cava. Right-sided chest tube in position with tip and sideport projecting over the right hemithorax.  No appreciable pneumothorax is identified on today's examination.  Lung volumes are low with bibasilar linear opacities favored to reflect areas of subsegmental atelectasis.  Postoperative changes of right middle lobe wedge resection are again noted with persistent ill-defined opacity in this region, likely reflects a resolving postoperative hemorrhage and atelectasis.  Pulmonary vascular crowding accentuated by low lung volumes, without frank pulmonary edema.  Heart size is borderline enlarged (likely accentuated by low lung volumes).  Mediastinal contours appear widened, likely accentuated by low lung volumes, rightward rotation and lordotic positioning.  IMPRESSION: 1.  Persistent low lung volumes with bibasilar areas of atelectasis and postoperative changes in the right middle lobe, as above. 2.  Support apparatus, as above.   Original Report Authenticated By: Florencia Reasons, M.D.    Dg Chest Portable 1 View  12/03/2011  *RADIOLOGY REPORT*  Clinical Data: Postop.  PORTABLE CHEST - 1 VIEW  Comparison: Earlier today.  Findings: Interval right chest tube with its tip at the medial right lung apex.  No pneumothorax.  Interval right jugular catheter tip in the superior vena cava.  Interval linear densities in both lower lung zones with a decreased inspiration.  The cardiac silhouette currently appears mildly enlarged, magnified by the decreased inspiration.  Cervical spine fixation hardware is unchanged.  IMPRESSION:  1.  Poor inspiration with mild bibasilar atelectasis. 2.  Right chest tube  without pneumothorax.   Original Report Authenticated By: Darrol Angel, M.D.     MEDICATIONS: Scheduled Meds:    . acyclovir  10 mg/kg (Ideal) Intravenous Q8H  . aspirin EC  81 mg Oral Daily  . clonazePAM  0.5 mg Oral BID  . guaiFENesin  600 mg Oral BID  . heparin  5,000 Units Subcutaneous Q8H  . influenza  inactive virus vaccine  0.5 mL Intramuscular Tomorrow-1000  . methylPREDNISolone (SOLU-MEDROL) injection  125 mg Intravenous Q12H  . metoprolol tartrate  25 mg Oral BID  . neomycin-bacitracin-polymyxin   Topical BID  . rosuvastatin  10 mg Oral q1800  . sodium chloride  3 mL Intravenous Q12H  . Tamsulosin HCl  0.4 mg Oral Daily  . DISCONTD: atorvastatin  20 mg Oral q1800   Continuous Infusions:    . sodium chloride 100 mL/hr at 12/20/11 0535   PRN Meds:.acetaminophen, acetaminophen, alum & mag hydroxide-simeth, clonazePAM, clonazePAM, diphenhydrAMINE, haloperidol lactate, nitroGLYCERIN, ondansetron (ZOFRAN) IV, ondansetron,  polyethylene glycol, DISCONTD: clonazePAM  Antibiotics: Anti-infectives     Start     Dose/Rate Route Frequency Ordered Stop   12/18/11 2000   acyclovir (ZOVIRAX) 730 mg in dextrose 5 % 150 mL IVPB        10 mg/kg  73 kg (Ideal) 164.6 mL/hr over 60 Minutes Intravenous 3 times per day 12/18/11 1849     12/17/11 1000   doxycycline (VIBRAMYCIN) 50 MG capsule 50 mg  Status:  Discontinued     Comments: Use for Viibryd 40 mg      50 mg Oral Daily 12/17/11 0237 12/17/11 0854           Jeoffrey Massed, MD  [facility] Pager:[phone]  If 7PM-7AM, please contact night-coverage [email or web address] Password [email or web address] 12/20/2011, 1:00 PM   LOS: 4 days

## 2011-12-20 NOTE — Progress Notes (Signed)
Subjective: Patient reports that he was unable to sleep last evening and therefore feels poorly today.  Is able to communicate well and does not seem confused but clearly has some word finding difficulties.  Has been more emotional as well and cries easily.   Objective: Current vital signs: BP 121/71  Pulse 52  Temp 98.5 F (36.9 C) (Oral)  Resp 18  Ht 5\' 10"  (1.778 m)  Wt 99.4 kg (219 lb 2.2 oz)  BMI 31.44 kg/m2  SpO2 94% Vital signs in last 24 hours: Temp:  [97.7 F (36.5 C)-98.5 F (36.9 C)] 98.5 F (36.9 C) (09/12 0629) Pulse Rate:  [52-118] 52  (09/12 0959) Resp:  [16-20] 18  (09/12 0629) BP: (121-149)/(71-86) 121/71 mmHg (09/12 0959) SpO2:  [94 %-97 %] 94 % (09/12 0629)  Intake/Output from previous day: 09/11 0701 - 09/12 0700 In: 4398.3 [P.O.:1240; I.V.:3158.3] Out: -  Intake/Output this shift:   Nutritional status: Cardiac  Neurologic Exam: Mental Status:  Alert, oriented, thought content appropriate.  Emotionally labile.  Speech fluent without evidence of aphasia but has significant word finding difficulites. Able to follow 3 step commands without difficulty.  Cranial Nerves:  II: Discs flat bilaterally; Visual fields grossly normal, pupils equal, round, reactive to light and accommodation  III,IV, VI: ptosis not present, extra-ocular motions intact bilaterally  V,VII: smile symmetric, facial light touch sensation normal bilaterally  VIII: hearing normal bilaterally  IX,X: gag reflex present  XI: bilateral shoulder shrug  XII: midline tongue extension  Motor:  Right : Upper extremity 5/5        Left: Upper extremity 5/5   Lower extremity 5/5      Lower extremity 5/5  Tone and bulk:normal tone throughout; no atrophy noted. Low amplitude, high frequency tremor noted in the feet bilaterally  Sensory: Pinprick and light touch intact throughout, bilaterally  Deep Tendon Reflexes: 2+ and symmetric throughout  Plantars:  Right: downgoing Left: downgoing    Cerebellar:  normal finger-to-nose and normal heel-to-shin test  normal gait and station  Lab Results: Basic Metabolic Panel:  Lab 12/20/11 1610 12/19/11 0725 12/17/11 0851 12/16/11 2239  NA 136 137 132* 135  K 3.6 3.7 3.8 4.1  CL 101 102 96 100  CO2 24 25 24 25   GLUCOSE 145* 101* 121* 96  BUN 8 6 9 13   CREATININE 0.80 0.91 0.83 1.02  CALCIUM 9.3 9.2 9.2 --  MG -- -- -- --  PHOS -- -- -- --    Liver Function Tests:  Lab 12/17/11 0851 12/16/11 2239  AST 23 25  ALT 23 26  ALKPHOS 78 74  BILITOT 0.7 0.4  PROT 8.2 8.5*  ALBUMIN 3.3* 3.2*   No results found for this basename: LIPASE:5,AMYLASE:5 in the last 168 hours  Lab 12/16/11 2246  AMMONIA <10*    CBC:  Lab 12/19/11 0725 12/17/11 0851 12/16/11 2239  WBC 8.7 11.0* 10.1  NEUTROABS -- -- 6.1  HGB 13.3 13.6 14.3  HCT 39.2 39.3 41.7  MCV 91.6 92.0 93.5  PLT 537* 523* 526*    Cardiac Enzymes: No results found for this basename: CKTOTAL:5,CKMB:5,CKMBINDEX:5,TROPONINI:5 in the last 168 hours  Lipid Panel: No results found for this basename: CHOL:5,TRIG:5,HDL:5,CHOLHDL:5,VLDL:5,LDLCALC:5 in the last 168 hours  CBG:  Lab 12/16/11 2148  GLUCAP 110*    Microbiology: Results for orders placed during the hospital encounter of 11/29/11  SURGICAL PCR SCREEN     Status: Normal   Collection Time   11/29/11  3:36 PM  Component Value Range Status Comment   MRSA, PCR NEGATIVE  NEGATIVE Final    Staphylococcus aureus NEGATIVE  NEGATIVE Final     Coagulation Studies: No results found for this basename: LABPROT:5,INR:5 in the last 72 hours  Imaging: Ct Angio Head W/cm &/or Wo Cm  12/19/2011  *RADIOLOGY REPORT*  Clinical Data:  71 year old male with altered mental status, abnormal speech, dizziness.  CT ANGIOGRAPHY HEAD  Technique:  Multidetector CT imaging of the head was performed using the standard protocol during bolus administration of intravenous contrast.  Multiplanar CT image reconstructions including MIPs  were obtained to evaluate the vascular anatomy.  Contrast: 50mL OMNIPAQUE IOHEXOL 350 MG/ML SOLN  Comparison:  Brain MRIs 0949 hours the same day and 12/17/2011. Head CT without contrast 12/16/2011.  Findings:  Lower cervical ACDF hardware on the scout view.  Stable cerebral volume. Stable ventricle size and configuration. No midline shift, mass effect, or evidence of mass lesion.  No acute intracranial hemorrhage identified.  No evidence of cortically based acute infarction identified.  No abnormal enhancement identified.  No acute osseous abnormality identified. Visualized paranasal sinuses and mastoids are clear.  Vascular Findings: Major intracranial venous structures are enhancing.  Codominant distal vertebral arteries are patent.  Normal PICA vessels.  Normal vertebrobasilar junction.  No basilar artery stenosis.  SCA and PCA origins are within normal limits.  Small bilateral posterior communicating arteries.  Bilateral PCA branches are within normal limits.  Mild to moderate tortuosity of the distal cervical right ICA. Patent right ICA siphon with minimal atherosclerosis.  Normal right ophthalmic artery origin.  Normal right posterior communicating artery origin. Patent left ICA siphon with mild intermittent calcified plaque.  No stenosis.  Left ophthalmic and posterior communicating artery origins are normal.  The normal carotid termini.  Normal MCA and ACA origins.  Normal anterior communicating artery.  Bilateral ACA branches are within normal limits.  Bilateral MCA branches are within normal limits.   Review of the MIP images confirms the above findings.  IMPRESSION: 1.  Normal for age intracranial CTA. 2. No acute intracranial abnormality.   Original Report Authenticated By: Harley Hallmark, M.D.    Mr Brain Wo Contrast  12/19/2011  *RADIOLOGY REPORT*  Clinical Data: 71 year old male with altered mental status, abnormal speech.  MRI HEAD WITHOUT CONTRAST  Technique:  Multiplanar, multiecho pulse  sequences of the brain and surrounding structures were obtained according to standard protocol without intravenous contrast.  Comparison: Brain MRI 12/17/2011 and earlier.  Findings: Similar degree of motion artifact intermittently noted throughout today's exam.  Mildly heterogeneous diffusion with no convincing diffusion restriction to suggest acute infarct.  The previous to foci do not seem to persist.  Major intracranial vascular flow voids are stable.  No midline shift, ventriculomegaly, mass effect, evidence of mass lesion, extra-axial collection or acute intracranial hemorrhage. Cervicomedullary junction and pituitary are within normal limits.  Stable gray and white matter signal, with mild for age scattered cerebral white matter T2 and FLAIR hyperintensity.  The negative visualized cervical spine.  Normal marrow signal.  Stable orbits. Visualized paranasal sinuses and mastoids are clear. Negative scalp soft tissues.  IMPRESSION: No convincing acute infarct or acute intracranial abnormality.   Original Report Authenticated By: Harley Hallmark, M.D.     Medications:  I have reviewed the patient's current medications. Scheduled:   . acyclovir  10 mg/kg (Ideal) Intravenous Q8H  . aspirin EC  81 mg Oral Daily  . clonazePAM  0.5 mg Oral BID  . guaiFENesin  600 mg Oral BID  . heparin  5,000 Units Subcutaneous Q8H  . influenza  inactive virus vaccine  0.5 mL Intramuscular Tomorrow-1000  . methylPREDNISolone (SOLU-MEDROL) injection  125 mg Intravenous Q12H  . metoprolol tartrate  25 mg Oral BID  . neomycin-bacitracin-polymyxin   Topical BID  . rosuvastatin  10 mg Oral q1800  . sodium chloride  3 mL Intravenous Q12H  . Tamsulosin HCl  0.4 mg Oral Daily  . DISCONTD: atorvastatin  20 mg Oral q1800    Assessment/Plan:  Patient Active Hospital Problem List: DEPRESSION (01/17/2007)   Assessment: Likely exacerbated by steroids which are contributing to his increase in lability and insomnia.    Plan:   1.  Will increase nighttime Clonazepam to 1mg  which has worked in the past for sleep.  Also has an order for prn Benadryl to be used as well.  Has a history of not responding to Ambien and Haldol for sleep.  Trazodone may be used as well if the above is not useful.   Confusion with non-focal neuro exam (12/17/2011)   Assessment: s/p 2 doses of Solumedrol-anticipate 6 doses before going to an oral taper.     Plan:  1. Continue Solumedrol    LOS: 4 days   Thana Farr, MD [facility] [phone] 12/20/2011  10:07 AM

## 2011-12-21 LAB — HIV ANTIBODY (ROUTINE TESTING W REFLEX): HIV: NONREACTIVE

## 2011-12-21 MED ORDER — PREDNISONE 5 MG/ML PO CONC
60.0000 mg | Freq: Every day | ORAL | Status: DC
  Administered 2011-12-22: 60 mg via ORAL
  Filled 2011-12-21 (×2): qty 12

## 2011-12-21 MED ORDER — METOPROLOL TARTRATE 50 MG PO TABS
50.0000 mg | ORAL_TABLET | Freq: Two times a day (BID) | ORAL | Status: DC
  Administered 2011-12-21 – 2011-12-24 (×6): 50 mg via ORAL
  Filled 2011-12-21 (×7): qty 1

## 2011-12-21 MED ORDER — QUETIAPINE 12.5 MG HALF TABLET
12.5000 mg | ORAL_TABLET | Freq: Every day | ORAL | Status: DC
  Administered 2011-12-21 – 2011-12-23 (×3): 12.5 mg via ORAL
  Filled 2011-12-21 (×4): qty 1

## 2011-12-21 NOTE — Progress Notes (Signed)
PT is very combative. Pt has been kicking and swinging at the nursing staff. Pt refuses to stay in bed. Security has been called and MD on call has been paged. Pt scratched leg when climbing over the side of the bed. RN put mepilex on wound.

## 2011-12-21 NOTE — Progress Notes (Signed)
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 Lipitor (Atorvastatin) Other (See Comments)    Joint and muscle pain    Patient Measurements: Height: 5\' 10"  (177.8 cm) Weight: 219 lb 2.2 oz (99.4 kg) IBW/kg (Calculated) : 73    Vital Signs: Temp: 98.7 F (37.1 C) (09/13 0709) Temp src: Oral (09/13 0709) BP: 148/91 mmHg (09/13 0709) Pulse Rate: 111  (09/13 0709) Intake/Output from previous day: 09/12 0701 - 09/13 0700 In: 2551.7 [P.O.:682; I.V.:1046.7; IV Piggyback:823] Out: -  Intake/Output from this shift:    Labs:  Basename 12/20/11 0555 12/19/11 0725  WBC -- 8.7  HGB -- 13.3  PLT -- 537*  LABCREA -- --  CREATININE 0.80 0.91   Estimated Creatinine Clearance: 104.5 ml/min (by C-G formula based on Cr of 0.8). No results found for this basename: VANCOTROUGH:2,VANCOPEAK:2,VANCORANDOM:2,GENTTROUGH:2,GENTPEAK:2,GENTRANDOM:2,TOBRATROUGH:2,TOBRAPEAK:2,TOBRARND:2,AMIKACINPEAK:2,AMIKACINTROU:2,AMIKACIN:2, in the last 72 hours   Assessment: Mr. Orndoff continues on Acyclovir for suspected encephalitis. Remains afebrile with nml WBC. LP pending Effient wash out (last dose 9/10). Scr and lytes have remained nml and stable.  Goal of Therapy:  Treat infection  Plan:  - Continue Acyclovir 730mg  IV q8h - Will monitor cx/spec/sens, renal fn and clinical status daily.  Thanks, Kisha Messman K. Allena Katz, PharmD, BCPS.  Clinical Pharmacist Pager [phone]. 12/21/2011 9:57 AM

## 2011-12-21 NOTE — Progress Notes (Signed)
Subjective: Patient sleeping this morning.  Did not awaken since it seems the patient had a very busy night.  Per wife about 6pm began to announce that he was going home.  At times she had to physically prevent him.  Had been excessively labile during the day.  Per nursing notes was combative and pulled out IV's.   Objective: Current vital signs: BP 148/91  Pulse 111  Temp 98.7 F (37.1 C) (Oral)  Resp 20  Ht 5\' 10"  (1.778 m)  Wt 99.4 kg (219 lb 2.2 oz)  BMI 31.44 kg/m2  SpO2 94% Vital signs in last 24 hours: Temp:  [97.2 F (36.2 C)-98.7 F (37.1 C)] 98.7 F (37.1 C) (09/13 0709) Pulse Rate:  [93-126] 111  (09/13 0709) Resp:  [20] 20  (09/13 0709) BP: (129-164)/(67-141) 148/91 mmHg (09/13 0709) SpO2:  [93 %-96 %] 94 % (09/13 0709)  Intake/Output from previous day: 09/12 0701 - 09/13 0700 In: 2551.7 [P.O.:682; I.V.:1046.7; IV Piggyback:823] Out: -  Intake/Output this shift:   Nutritional status: Cardiac  Neurologic Exam: Patient sleeping.  Lab Results: Basic Metabolic Panel:  Lab 12/20/11 4098 12/19/11 0725 12/17/11 0851 12/16/11 2239  NA 136 137 132* 135  K 3.6 3.7 3.8 4.1  CL 101 102 96 100  CO2 24 25 24 25   GLUCOSE 145* 101* 121* 96  BUN 8 6 9 13   CREATININE 0.80 0.91 0.83 1.02  CALCIUM 9.3 9.2 9.2 --  MG -- -- -- --  PHOS -- -- -- --    Liver Function Tests:  Lab 12/17/11 0851 12/16/11 2239  AST 23 25  ALT 23 26  ALKPHOS 78 74  BILITOT 0.7 0.4  PROT 8.2 8.5*  ALBUMIN 3.3* 3.2*   No results found for this basename: LIPASE:5,AMYLASE:5 in the last 168 hours  Lab 12/16/11 2246  AMMONIA <10*    CBC:  Lab 12/19/11 0725 12/17/11 0851 12/16/11 2239  WBC 8.7 11.0* 10.1  NEUTROABS -- -- 6.1  HGB 13.3 13.6 14.3  HCT 39.2 39.3 41.7  MCV 91.6 92.0 93.5  PLT 537* 523* 526*    Cardiac Enzymes: No results found for this basename: CKTOTAL:5,CKMB:5,CKMBINDEX:5,TROPONINI:5 in the last 168 hours  Lipid Panel: No results found for this basename:  CHOL:5,TRIG:5,HDL:5,CHOLHDL:5,VLDL:5,LDLCALC:5 in the last 168 hours  CBG:  Lab 12/16/11 2148  GLUCAP 110*    Microbiology: Results for orders placed during the hospital encounter of 11/29/11  SURGICAL PCR SCREEN     Status: Normal   Collection Time   11/29/11  3:36 PM      Component Value Range Status Comment   MRSA, PCR NEGATIVE  NEGATIVE Final    Staphylococcus aureus NEGATIVE  NEGATIVE Final     Coagulation Studies: No results found for this basename: LABPROT:5,INR:5 in the last 72 hours  Imaging: No results found.  Medications:  I have reviewed the patient's current medications. Scheduled:   . acyclovir  10 mg/kg (Ideal) Intravenous Q8H  . aspirin EC  81 mg Oral Daily  . clonazePAM  0.5 mg Oral BID  . guaiFENesin  600 mg Oral BID  . heparin  5,000 Units Subcutaneous Q8H  . influenza  inactive virus vaccine  0.5 mL Intramuscular Tomorrow-1000  . methylPREDNISolone (SOLU-MEDROL) injection  125 mg Intravenous Q12H  . metoprolol tartrate  25 mg Oral BID  . neomycin-bacitracin-polymyxin   Topical BID  . rosuvastatin  10 mg Oral q1800  . sodium chloride  3 mL Intravenous Q12H  . Tamsulosin HCl  0.4  mg Oral Daily    Assessment/Plan:  Patient Active Hospital Problem List:  Confusion with non-focal neuro exam (12/17/2011)   Assessment: Patient agitated overnight.  More labile.  May very well be a consequence of the steroids.  Now refusing IV medications.     Plan:  1. Start taper of steroids.  Will change to po.  Will D/C IV steroids.    2.  Seroquel may be useful for night time agitation  Case discussed with wife and Dr. Jerral Ralph at length   LOS: 5 days   Thana Farr, MD [facility] [phone] 12/21/2011  11:57 AM

## 2011-12-21 NOTE — Progress Notes (Signed)
Pt refuses to stay in the bed and has been pulling on his IV. Pt has pulled out his IV and refuses to let the RN start a new IV on him. RN paged MD on call

## 2011-12-21 NOTE — Progress Notes (Signed)
PATIENT DETAILS Name: Tyler Mayo Age: 71 y.o. Sex: male Date of Birth: [birth year] Admit Date: 12/16/2011 Admitting Physician Huey Bienenstock, MD [id number] Freida Busman, MD  Subjective: Apparently very confused and agitated last night  Assessment/Plan: Active Problems: AMS -mostly in the form of confusion -he however does answer a lot of questions appropriately this am-but very agitated last night -per wife-this was sudden in onset, when he woke up 9/8-he was in his usual state of health, but retrospectively-she does state that he did have intermittent periods of confusion since his discharge from the VAT's procedure -etiology of this confusion remains unclear-but on empiric trial of steroids and Acyclovir -Unable to do LP as on both ASA and Effient, Effient stopped 9/10-in case we need to do LP in the near future. - Spoke with Dr. Dierdre Forth 9/10-patient's rheumatologist-case discussed including prior positive autoimmune markers-per Dr. Dierdre Forth these markers were subsequently negative when he repeated them. He does not think that the patient's current presentation is consistent with autoimmune disease. -suspicion that worsening confusion and agitation-may be from steroids-therefore  Anxiety  -c/w Klonopin -have added prn as well -emotional lability likely from steroids  ?Urinary Retention -Foley removed 9/9 -able to urinate following foley removal -?BPH-c/w Flomax   DEPRESSION -resume Vilazodone-in the next few days   CAD (coronary artery disease) -no chest pain or SOB -last PCI earlier this year -c/w ASA, off Effient since 9/10 incase we have to do a LP -c/w Statin and Metoprolol as well  Sinus Tachycardia -likely 2/2 anxiety -check echo -TSH earlier this admit-normal   Hyperlipidemia -c/w Statin   Pulmonary nodules -s/p VATS and biopsy last month -unclear about the biopsy results-so spoke with Dr [name] today-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   Gen Exam: Awake and Mostly alert with clear speech.   Neck: Supple, No JVD.  Chest: B/L Clear.  No rales CVS: S1 S2 Regular, no murmurs.  Abdomen: soft, BS +, non tender, non distended.  Extremities: no edema, lower extremities warm to touch. Neurologic: Non Focal.   Skin: No Rash.   Wounds: N/A.    Intake/Output from previous day:  Intake/Output Summary (Last 24 hours) at 12/21/11 1531 Last data filed at 12/21/11 0900  Gross per 24 hour  Intake 2209.67 ml  Output      0 ml  Net 2209.67 ml     LAB RESULTS: CBC  Lab 12/19/11 0725 12/17/11 0851 12/16/11 2239  WBC 8.7 11.0* 10.1  HGB 13.3 13.6 14.3  HCT 39.2 39.3 41.7  PLT 537* 523* 526*  MCV 91.6 92.0 93.5  MCH 31.1 31.9 32.1  MCHC 33.9 34.6 34.3  RDW 13.9 13.9 14.0  LYMPHSABS -- -- 2.7  MONOABS -- -- 0.8  EOSABS -- -- 0.5  BASOSABS -- -- 0.1  BANDABS -- -- --    Chemistries   Lab 12/20/11 0555 12/19/11 0725 12/17/11 0851 12/16/11 2239  NA 136 137 132* 135  K 3.6 3.7 3.8 4.1  CL 101 102 96 100  CO2 24 25 24 25   GLUCOSE 145* 101* 121* 96  BUN 8 6 9 13   CREATININE 0.80 0.91 0.83 1.02  CALCIUM 9.3 9.2 9.2 9.6  MG -- -- -- --    CBG:  Lab 12/16/11 2148  GLUCAP 110*    GFR Estimated Creatinine Clearance: 104.5 ml/min (by C-G formula based on Cr of 0.8).  Coagulation profile  Lab 12/16/11 2239  INR 1.17  PROTIME --    Cardiac  Enzymes No results found for this basename: CK:3,CKMB:3,TROPONINI:3,MYOGLOBIN:3 in the last 168 hours  No components found with this basename: POCBNP:3 No results found for this basename: DDIMER:2 in the last 72 hours No results found for this basename: HGBA1C:2 in the last 72 hours No results found for this basename: CHOL:2,HDL:2,LDLCALC:2,TRIG:2,CHOLHDL:2,LDLDIRECT:2 in the last 72 hours No results found for this basename: TSH,T4TOTAL,FREET3,T3FREE,THYROIDAB in the last 72 hours No results found for this basename: VITAMINB12:2,FOLATE:2,FERRITIN:2,TIBC:2,IRON:2,RETICCTPCT:2 in the last 72 hours No results found for this basename: LIPASE:2,AMYLASE:2 in the last 72 hours  Urine Studies No results found for this basename: UACOL:2,UAPR:2,USPG:2,UPH:2,UTP:2,UGL:2,UKET:2,UBIL:2,UHGB:2,UNIT:2,UROB:2,ULEU:2,UEPI:2,UWBC:2,URBC:2,UBAC:2,CAST:2,CRYS:2,UCOM:2,BILUA:2 in the last 72 hours  MICROBIOLOGY: No results found for this or any previous visit (from the past 240 hour(s)).  RADIOLOGY STUDIES/RESULTS: Dg Chest 2 View  12/06/2011  *RADIOLOGY REPORT*  Clinical Data: Right lung mass, status post VATS  CHEST - 2 VIEW  Comparison: 12/05/2011  Findings: Patchy right lower lobe opacity, possibly atelectasis. Small bilateral pleural effusions.  No pneumothorax.  The heart is mildly enlarged, unchanged.  Mild degenerative changes of the visualized thoracolumbar spine. Cervical spine fixation hardware.  IMPRESSION: No pneumothorax is seen.  Suspected right lower lobe atelectasis with small bilateral pleural effusions.   Original Report Authenticated By: Charline Bills, M.D.    Dg Chest 2 View  12/03/2011  *RADIOLOGY REPORT*  Clinical Data: Preop radiograph.  CHEST - 2 VIEW  Comparison: 05/12/2011  Findings: Heart size and mediastinal contours are unremarkable.  No pleural effusion or pulmonary edema identified.  No airspace consolidation identified.  IMPRESSION:  1.  No acute cardiopulmonary abnormalities.    Original Report Authenticated By: Rosealee Albee, M.D.    Ct Head Wo Contrast  12/16/2011  *RADIOLOGY REPORT*  Clinical Data: Altered mental status.  Difficulty with speech.  CT HEAD WITHOUT CONTRAST  Technique:  Contiguous axial images were obtained from the base of the skull through the vertex without contrast.  Comparison: None.  Findings: Mild diffuse cerebral atrophy.  Ventricular dilatation consistent with central atrophy.  No mass effect or midline shift. No abnormal extra-axial fluid collections.  Low attenuation changes in the deep white matter consistent with patchy small vessel ischemic change.  Gray-white matter junctions are distinct.  Basal cisterns are not effaced.  No evidence of acute intracranial hemorrhage.  No depressed skull fractures.  Visualized paranasal sinuses and mastoid air cells are not opacified.  IMPRESSION: No acute intracranial abnormalities.   Original Report Authenticated By: Marlon Pel, M.D.    Dg Chest Port 1 View  12/16/2011  *RADIOLOGY REPORT*  Clinical Data: Altered mental status.  Recent VATS.  PORTABLE CHEST - 1 VIEW  Comparison: 12/06/2011.  Findings: Linear and patchy density at the right lung base with improvement.  Minimal right pleural fluid, also improved.  The left lateral costophrenic angle is  not currently included.  No visible pleural fluid on the left.  The included left lung is clear. Stable cervical spine fixation hardware.  IMPRESSION:  1.  Improving right basilar atelectasis and possible pneumonia. 2.  Small right pleural effusion, decreased.   Original Report Authenticated By: Darrol Angel, M.D.    Dg Chest Port 1 View  12/05/2011  *RADIOLOGY REPORT*  Clinical Data: Chest tube removal  PORTABLE CHEST - 1 VIEW  Comparison: Chest radiograph 11/27/2011 at 6:20 hours  Findings: Interval removal of right chest tube.  No evidence of pneumothorax.  There is persistent bibasilar atelectasis,  right greater on the right.  Stable enlarged heart  silhouette.  IMPRESSION: No evidence of pneumothorax following right chest tube removal.  Bibasilar atelectasis.   Original Report Authenticated By: Genevive Bi, M.D.    Dg Chest Port 1 View  12/05/2011  *RADIOLOGY REPORT*  Clinical Data: Lung mass, VATS.  PORTABLE CHEST - 1 VIEW  Comparison: 12/04/2011 and CT chest 11/13/2011.  Findings: Trachea is midline.  Right IJ central line tip projects over the SVC.  Right chest tube remains in place at the apex of the right hemithorax.  Heart is enlarged, stable. Postoperative changes in the right hemithorax.  Lungs are low volume with diffuse interstitial prominence and indistinctness.  No pneumothorax. Bibasilar air space disease, right greater than left.  Possible tiny right pleural effusion.  IMPRESSION:  1.  No pneumothorax with right chest tube in place. 2.  Postoperative changes in the right hemithorax with stable right basilar airspace disease and possible tiny right pleural effusion. 3.  Mild diffuse interstitial prominence may be due to vascular crowding.  Pulmonary edema cannot be excluded. 4.  Left basilar airspace disease, stable.   Original Report Authenticated By: Reyes Ivan, M.D.    Dg Chest Port 1 View  12/04/2011  *RADIOLOGY REPORT*  Clinical Data: Chest tube placement.  Shortness of breath.  PORTABLE CHEST - 1 VIEW  Comparison: Chest x-ray 12/03/2011.  Findings: There is a right-sided internal jugular central venous catheter with tip terminating in the distal superior vena cava. Right-sided chest tube in position with tip and sideport projecting over the right hemithorax.  No appreciable pneumothorax is identified on today's examination.  Lung volumes are low with bibasilar linear opacities favored to reflect areas of subsegmental atelectasis.  Postoperative changes of right middle lobe wedge resection are again noted with persistent ill-defined opacity in this region, likely reflects a resolving postoperative hemorrhage and atelectasis.   Pulmonary vascular crowding accentuated by low lung volumes, without frank pulmonary edema.  Heart size is borderline enlarged (likely accentuated by low lung volumes). Mediastinal contours appear widened, likely accentuated by low lung volumes, rightward rotation and lordotic positioning.  IMPRESSION: 1.  Persistent low lung volumes with bibasilar areas of atelectasis and postoperative changes in the right middle lobe, as above. 2.  Support apparatus, as above.   Original Report Authenticated By: Florencia Reasons, M.D.    Dg Chest Portable 1 View  12/03/2011  *RADIOLOGY REPORT*  Clinical Data: Postop.  PORTABLE CHEST - 1 VIEW  Comparison: Earlier today.  Findings: Interval right chest tube with its tip at the medial right lung apex.  No pneumothorax.  Interval right jugular catheter tip in the superior vena cava.  Interval linear densities in both lower lung zones with a decreased inspiration.  The cardiac silhouette currently appears mildly enlarged, magnified by the decreased inspiration.  Cervical spine fixation hardware is unchanged.  IMPRESSION:  1.  Poor inspiration with mild bibasilar atelectasis. 2.  Right chest tube without pneumothorax.   Original Report Authenticated By: Darrol Angel, M.D.     MEDICATIONS: Scheduled Meds:    . acyclovir  10 mg/kg (Ideal) Intravenous Q8H  . aspirin EC  81 mg Oral Daily  . clonazePAM  0.5 mg Oral BID  . guaiFENesin  600 mg Oral BID  . heparin  5,000 Units Subcutaneous Q8H  . influenza  inactive virus vaccine  0.5 mL Intramuscular Tomorrow-1000  . metoprolol tartrate  25 mg Oral BID  . neomycin-bacitracin-polymyxin   Topical BID  . predniSONE  60 mg Oral Q breakfast  . QUEtiapine  12.5 mg Oral QHS  . rosuvastatin  10 mg Oral q1800  . sodium chloride  3 mL Intravenous Q12H  . Tamsulosin HCl  0.4 mg Oral Daily  . DISCONTD: methylPREDNISolone (SOLU-MEDROL) injection  125 mg Intravenous Q12H   Continuous Infusions:    . sodium chloride 50 mL/hr  at 12/20/11 2334   PRN Meds:.acetaminophen, acetaminophen, alum & mag hydroxide-simeth, clonazePAM, clonazePAM, diphenhydrAMINE, haloperidol lactate, nitroGLYCERIN, ondansetron (ZOFRAN) IV, ondansetron, polyethylene glycol  Antibiotics: Anti-infectives     Start     Dose/Rate Route Frequency Ordered Stop   12/18/11 2000   acyclovir (ZOVIRAX) 730 mg in dextrose 5 % 150 mL IVPB        10 mg/kg  73 kg (Ideal) 164.6 mL/hr over 60 Minutes Intravenous 3 times per day 12/18/11 1849     12/17/11 1000   doxycycline (VIBRAMYCIN) 50 MG capsule 50 mg  Status:  Discontinued     Comments: Use for Viibryd 40 mg      50 mg Oral Daily 12/17/11 0237 12/17/11 0854           Jeoffrey Massed, MD  [facility] Pager:[phone]  If 7PM-7AM, please contact night-coverage [email or web address] Password [email or web address] 12/21/2011, 3:31 PM   LOS: 5 days

## 2011-12-22 LAB — BASIC METABOLIC PANEL WITH GFR
BUN: 10 mg/dL (ref 6–23)
CO2: 24 meq/L (ref 19–32)
Calcium: 8.9 mg/dL (ref 8.4–10.5)
Chloride: 104 meq/L (ref 96–112)
Creatinine, Ser: 0.89 mg/dL (ref 0.50–1.35)
GFR calc Af Amer: >90 mL/min
GFR calc non Af Amer: 85 mL/min — ABNORMAL LOW
Glucose, Bld: 75 mg/dL (ref 70–99)
Potassium: 3.5 meq/L (ref 3.5–5.1)
Sodium: 139 meq/L (ref 135–145)

## 2011-12-22 LAB — CBC
HCT: 38.1 % — ABNORMAL LOW (ref 39.0–52.0)
Hemoglobin: 12.5 g/dL — ABNORMAL LOW (ref 13.0–17.0)
MCH: 30.6 pg (ref 26.0–34.0)
MCHC: 32.8 g/dL (ref 30.0–36.0)
MCV: 93.4 fL (ref 78.0–100.0)
Platelets: 411 K/uL — ABNORMAL HIGH (ref 150–400)
RBC: 4.08 MIL/uL — ABNORMAL LOW (ref 4.22–5.81)
RDW: 14.5 % (ref 11.5–15.5)
WBC: 8.1 K/uL (ref 4.0–10.5)

## 2011-12-22 MED ORDER — PREDNISONE 50 MG PO TABS
50.0000 mg | ORAL_TABLET | Freq: Every day | ORAL | Status: DC
  Administered 2011-12-23 – 2011-12-24 (×2): 50 mg via ORAL
  Filled 2011-12-22 (×3): qty 1

## 2011-12-22 MED ORDER — PREDNISONE 5 MG/ML PO CONC: 50.0000 mg | Freq: Every day | ORAL | Status: DC

## 2011-12-22 MED ORDER — HYDROCODONE-ACETAMINOPHEN 5-325 MG PO TABS
1.0000 | ORAL_TABLET | Freq: Four times a day (QID) | ORAL | Status: DC | PRN
  Administered 2011-12-22 – 2011-12-24 (×6): 1 via ORAL
  Filled 2011-12-22 (×6): qty 1

## 2011-12-22 NOTE — Progress Notes (Signed)
PATIENT DETAILS Name: JAXXEN VOONG Age: 71 y.o. Sex: male Date of Birth: 1940-06-24 Admit Date: 12/16/2011 Admitting Physician Huey Bienenstock, MD [id number] Freida Busman, MD  Subjective: Calmer and per wife mental status improved  Assessment/Plan: Active Problems: AMS -mostly in the form of confusion -he however does answer a lot of questions appropriately this am-but very agitated last night -per wife-this was sudden in onset, when he woke up 9/8-he was in his usual state of health, but retrospectively-she does state that he did have intermittent periods of confusion since his discharge from the [facility] procedure -etiology of this confusion remains unclear-but on empiric trial of steroids and Acyclovir -Unable to do LP as on both ASA and Effient, Effient stopped 9/10-in case we need to do LP in the near future, since clinically better-no need of LP per Dr Ed Blalock will restart Effient-however will restart only from tomorrow-to see if clinical improvement is persistent - Spoke with Dr. Dierdre Forth 9/10-patient's rheumatologist-case discussed including prior positive autoimmune markers-per Dr. Dierdre Forth these markers were subsequently negative when he repeated them. He does not think that the patient's current presentation is consistent with autoimmune disease. -now transitioned from IV Solumedrol to prednisone -Seroquel seems to be helping as well.  Anxiety  -c/w Klonopin -have added prn as well -emotional lability likely from steroids  ?Urinary Retention -Foley removed 9/9 -able to urinate following foley removal -?BPH-c/w Flomax   DEPRESSION -resume Vilazodone-in the next few days   CAD (coronary artery disease) -no chest pain or SOB -last PCI earlier this year -c/w ASA, off Effient since 9/10 incase we have to do a LP -c/w Statin and Metoprolol as well  Sinus Tachycardia -likely 2/2 anxiety and pain -await echo -TSH earlier this admit-normal -restarting Norco for  pain -monitor closely   Hyperlipidemia -c/w Statin   Pulmonary nodules -s/p VATS and biopsy last month -unclear about the biopsy results-so spoke with Dr [name] today-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   Gen Exam: Awake and Mostly alert with clear speech.   Neck: Supple, No JVD.  Chest: B/L Clear.  No rales CVS: S1 S2 Regular, no murmurs.  Abdomen: soft, BS +, non tender, non distended.  Extremities: no edema, lower extremities warm to touch. Neurologic: Non Focal.   Skin: No Rash.   Wounds: N/A.    Intake/Output from previous day:  Intake/Output Summary (Last 24 hours) at 12/22/11 1402 Last data filed at 12/22/11 0900  Gross per 24 hour  Intake 1751.73 ml  Output      0 ml  Net 1751.73 ml     LAB RESULTS: CBC  Lab 12/22/11 0625 12/19/11 0725 12/17/11 0851 12/16/11 2239  WBC 8.1 8.7 11.0* 10.1  HGB 12.5* 13.3 13.6 14.3  HCT 38.1* 39.2 39.3 41.7  PLT 411* 537* 523*  526*  MCV 93.4 91.6 92.0 93.5  MCH 30.6 31.1 31.9 32.1  MCHC 32.8 33.9 34.6 34.3  RDW 14.5 13.9 13.9 14.0  LYMPHSABS -- -- -- 2.7  MONOABS -- -- -- 0.8  EOSABS -- -- -- 0.5  BASOSABS -- -- -- 0.1  BANDABS -- -- -- --    Chemistries   Lab 12/22/11 0625 12/20/11 0555 12/19/11 0725 12/17/11 0851 12/16/11 2239  NA 139 136 137 132* 135  K 3.5 3.6 3.7 3.8 4.1  CL 104 101 102 96 100  CO2 24 24 25 24 25   GLUCOSE 75 145*  101* 121* 96  BUN 10 8 6 9 13   CREATININE 0.89 0.80 0.91 0.83 1.02  CALCIUM 8.9 9.3 9.2 9.2 9.6  MG -- -- -- -- --    CBG:  Lab 12/16/11 2148  GLUCAP 110*    GFR Estimated Creatinine Clearance: 92.6 ml/min (by C-G formula based on Cr of 0.89).  Coagulation profile  Lab 12/16/11 2239  INR 1.17  PROTIME --    Cardiac Enzymes No results found for this basename: CK:3,CKMB:3,TROPONINI:3,MYOGLOBIN:3 in the last 168 hours  No components found with this basename: POCBNP:3 No results found for this basename: DDIMER:2 in the last 72 hours No results found for this basename: HGBA1C:2 in the last 72 hours No results found for this basename: CHOL:2,HDL:2,LDLCALC:2,TRIG:2,CHOLHDL:2,LDLDIRECT:2 in the last 72 hours No results found for this basename: TSH,T4TOTAL,FREET3,T3FREE,THYROIDAB in the last 72 hours No results found for this basename: VITAMINB12:2,FOLATE:2,FERRITIN:2,TIBC:2,IRON:2,RETICCTPCT:2 in the last 72 hours No results found for this basename: LIPASE:2,AMYLASE:2 in the last 72 hours  Urine Studies No results found for this basename: UACOL:2,UAPR:2,USPG:2,UPH:2,UTP:2,UGL:2,UKET:2,UBIL:2,UHGB:2,UNIT:2,UROB:2,ULEU:2,UEPI:2,UWBC:2,URBC:2,UBAC:2,CAST:2,CRYS:2,UCOM:2,BILUA:2 in the last 72 hours  MICROBIOLOGY: No results found for this or any previous visit (from the past 240 hour(s)).  RADIOLOGY STUDIES/RESULTS: Dg Chest 2 View  12/06/2011  *RADIOLOGY REPORT*  Clinical Data: Right lung mass, status post VATS  CHEST - 2 VIEW  Comparison: 12/05/2011  Findings: Patchy right lower lobe opacity, possibly atelectasis. Small bilateral pleural effusions.  No pneumothorax.  The heart is mildly enlarged, unchanged.  Mild degenerative changes of the visualized thoracolumbar spine. Cervical spine fixation hardware.  IMPRESSION: No pneumothorax is seen.  Suspected right lower lobe atelectasis with small bilateral pleural effusions.   Original Report Authenticated By: Charline Bills, M.D.     Dg Chest 2 View  12/03/2011  *RADIOLOGY REPORT*  Clinical Data: Preop radiograph.  CHEST - 2 VIEW  Comparison: 05/12/2011  Findings: Heart size and mediastinal contours are unremarkable.  No pleural effusion or pulmonary edema identified.  No airspace consolidation identified.  IMPRESSION:  1.  No acute cardiopulmonary abnormalities.   Original Report Authenticated By: Rosealee Albee, M.D.    Ct Head Wo Contrast  12/16/2011  *RADIOLOGY REPORT*  Clinical Data: Altered mental status.  Difficulty with speech.  CT HEAD WITHOUT CONTRAST  Technique:  Contiguous axial images were obtained from the base of the skull through the vertex without contrast.  Comparison: None.  Findings: Mild diffuse cerebral atrophy.  Ventricular dilatation consistent with central atrophy.  No mass effect or midline shift. No abnormal extra-axial fluid collections.  Low attenuation changes in the deep white matter consistent with patchy small vessel ischemic change.  Gray-white matter junctions are distinct.  Basal cisterns are not effaced.  No evidence of acute intracranial hemorrhage.  No depressed skull fractures.  Visualized paranasal sinuses and mastoid air cells are not opacified.  IMPRESSION: No acute intracranial abnormalities.   Original Report Authenticated By:  Marlon Pel, M.D.    Dg Chest Port 1 View  12/16/2011  *RADIOLOGY REPORT*  Clinical Data: Altered mental status.  Recent VATS.  PORTABLE CHEST - 1 VIEW  Comparison: 12/06/2011.  Findings: Linear and patchy density at the right lung base with improvement.  Minimal right pleural fluid, also improved.  The left lateral costophrenic angle is not currently included.  No visible pleural fluid on the left.  The included left lung is clear. Stable cervical spine fixation hardware.  IMPRESSION:  1.  Improving right basilar atelectasis and possible pneumonia. 2.  Small right pleural effusion, decreased.   Original Report Authenticated By: Darrol Angel, M.D.    Dg  Chest Port 1 View  12/05/2011  *RADIOLOGY REPORT*  Clinical Data: Chest tube removal  PORTABLE CHEST - 1 VIEW  Comparison: Chest radiograph 11/27/2011 at 6:20 hours  Findings: Interval removal of right chest tube.  No evidence of pneumothorax.  There is persistent bibasilar atelectasis,  right greater on the right.  Stable enlarged heart silhouette.  IMPRESSION: No evidence of pneumothorax following right chest tube removal.  Bibasilar atelectasis.   Original Report Authenticated By: Genevive Bi, M.D.    Dg Chest Port 1 View  12/05/2011  *RADIOLOGY REPORT*  Clinical Data: Lung mass, VATS.  PORTABLE CHEST - 1 VIEW  Comparison: 12/04/2011 and CT chest 11/13/2011.  Findings: Trachea is midline.  Right IJ central line tip projects over the SVC.  Right chest tube remains in place at the apex of the right hemithorax.  Heart is enlarged, stable. Postoperative changes in the right hemithorax.  Lungs are low volume with diffuse interstitial prominence and indistinctness.  No pneumothorax. Bibasilar air space disease, right greater than left.  Possible tiny right pleural effusion.  IMPRESSION:  1.  No pneumothorax with right chest tube in place. 2.  Postoperative changes in the right hemithorax with stable right basilar airspace disease and possible tiny right pleural effusion. 3.  Mild diffuse interstitial prominence may be due to vascular crowding.  Pulmonary edema cannot be excluded. 4.  Left basilar airspace disease, stable.   Original Report Authenticated By: Reyes Ivan, M.D.    Dg Chest Port 1 View  12/04/2011  *RADIOLOGY REPORT*  Clinical Data: Chest tube placement.  Shortness of breath.  PORTABLE CHEST - 1 VIEW  Comparison: Chest x-ray 12/03/2011.  Findings: There is a right-sided internal jugular central venous catheter with tip terminating in the distal superior vena cava. Right-sided chest tube in position with tip and sideport projecting over the right hemithorax.  No appreciable pneumothorax is  identified on today's examination.  Lung volumes are low with bibasilar linear opacities favored to reflect areas of subsegmental atelectasis.  Postoperative changes of right middle lobe wedge resection are again noted with persistent ill-defined opacity in this region, likely reflects a resolving postoperative hemorrhage and atelectasis.  Pulmonary vascular crowding accentuated by low lung volumes, without frank pulmonary edema.  Heart size is borderline enlarged (likely accentuated by low lung volumes). Mediastinal contours appear widened, likely accentuated by low lung volumes, rightward rotation and lordotic positioning.  IMPRESSION: 1.  Persistent low lung volumes with bibasilar areas of atelectasis and postoperative changes in the right middle lobe, as above. 2.  Support apparatus, as above.   Original Report Authenticated By: Florencia Reasons, M.D.    Dg Chest Portable 1 View  12/03/2011  *RADIOLOGY REPORT*  Clinical Data: Postop.  PORTABLE CHEST - 1 VIEW  Comparison: Earlier today.  Findings: Interval right  chest tube with its tip at the medial right lung apex.  No pneumothorax.  Interval right jugular catheter tip in the superior vena cava.  Interval linear densities in both lower lung zones with a decreased inspiration.  The cardiac silhouette currently appears mildly enlarged, magnified by the decreased inspiration.  Cervical spine fixation hardware is unchanged.  IMPRESSION:  1.  Poor inspiration with mild bibasilar atelectasis. 2.  Right chest tube without pneumothorax.   Original Report Authenticated By: Darrol Angel, M.D.     MEDICATIONS: Scheduled Meds:    . acyclovir  10 mg/kg (Ideal) Intravenous Q8H  . aspirin EC  81 mg Oral Daily  . clonazePAM  0.5 mg Oral BID  . guaiFENesin  600 mg Oral BID  . heparin  5,000 Units Subcutaneous Q8H  . metoprolol tartrate  50 mg Oral BID  . neomycin-bacitracin-polymyxin   Topical BID  . predniSONE  50 mg Oral Q breakfast  . QUEtiapine  12.5 mg  Oral QHS  . rosuvastatin  10 mg Oral q1800  . sodium chloride  3 mL Intravenous Q12H  . Tamsulosin HCl  0.4 mg Oral Daily  . DISCONTD: metoprolol tartrate  25 mg Oral BID  . DISCONTD: predniSONE  50 mg Oral Q breakfast  . DISCONTD: predniSONE  60 mg Oral Q breakfast   Continuous Infusions:    . sodium chloride 50 mL/hr (12/22/11 1240)   PRN Meds:.acetaminophen, acetaminophen, alum & mag hydroxide-simeth, clonazePAM, clonazePAM, diphenhydrAMINE, haloperidol lactate, HYDROcodone-acetaminophen, nitroGLYCERIN, ondansetron (ZOFRAN) IV, ondansetron, polyethylene glycol  Antibiotics: Anti-infectives     Start     Dose/Rate Route Frequency Ordered Stop   12/18/11 2000   acyclovir (ZOVIRAX) 730 mg in dextrose 5 % 150 mL IVPB        10 mg/kg  73 kg (Ideal) 164.6 mL/hr over 60 Minutes Intravenous 3 times per day 12/18/11 1849     12/17/11 1000   doxycycline (VIBRAMYCIN) 50 MG capsule 50 mg  Status:  Discontinued     Comments: Use for Viibryd 40 mg      50 mg Oral Daily 12/17/11 0237 12/17/11 0854           Jeoffrey Massed, MD  [facility] Pager:[phone]  If 7PM-7AM, please contact night-coverage [email or web address] Password [email or web address] 12/22/2011, 2:02 PM   LOS: 6 days

## 2011-12-22 NOTE — Progress Notes (Signed)
Subjective: Patient was able to rest last evening.  Seems to be benefiting from Seroquel.  Alert and appropriate conversation experienced.  Objective: Current vital signs: BP 114/72  Pulse 103  Temp 99.2 F (37.3 C) (Oral)  Resp 18  Ht 5\' 10"  (1.778 m)  Wt 99.4 kg (219 lb 2.2 oz)  BMI 31.44 kg/m2  SpO2 93% Vital signs in last 24 hours: Temp:  [98.4 F (36.9 C)-99.2 F (37.3 C)] 99.2 F (37.3 C) (09/14 0619) Pulse Rate:  [93-106] 103  (09/14 1014) Resp:  [18-20] 18  (09/14 0619) BP: (110-136)/(70-84) 114/72 mmHg (09/14 1014) SpO2:  [93 %] 93 % (09/14 0619)  Intake/Output from previous day: 09/13 0701 - 09/14 0700 In: 1851.7 [P.O.:340; I.V.:853.3; IV Piggyback:658.4] Out: -  Intake/Output this shift: Total I/O In: 240 [P.O.:240] Out: -  Nutritional status: Cardiac  Neurologic Exam: Mental Status:  Alert, oriented, thought content appropriate. More emotionally stable. Speech fluent without evidence of aphasia but has significant word finding difficulites. Able to follow 3 step commands without difficulty.  Cranial Nerves:  II: Discs flat bilaterally; Visual fields grossly normal, pupils equal, round, reactive to light and accommodation  III,IV, VI: ptosis not present, extra-ocular motions intact bilaterally  V,VII: smile symmetric, facial light touch sensation normal bilaterally  VIII: hearing normal bilaterally  IX,X: gag reflex present  XI: bilateral shoulder shrug  XII: midline tongue extension  Motor:  Right : Upper extremity 5/5         Left: Upper extremity 5/5   Lower extremity 5/5      Lower extremity 5/5  Tone and bulk:normal tone throughout; no atrophy noted. Low amplitude, high frequency tremor noted in the feet bilaterally  Sensory: Pinprick and light touch intact throughout, bilaterally  Deep Tendon Reflexes: 2+ and symmetric throughout  Plantars:  Right: downgoing     Left: downgoing  Cerebellar:  normal finger-to-nose and normal heel-to-shin test     Lab Results: Basic Metabolic Panel:  Lab 12/22/11 1610 12/20/11 0555 12/19/11 0725 12/17/11 0851 12/16/11 2239  NA 139 136 137 132* 135  K 3.5 3.6 3.7 3.8 4.1  CL 104 101 102 96 100  CO2 24 24 25 24 25   GLUCOSE 75 145* 101* 121* 96  BUN 10 8 6 9 13   CREATININE 0.89 0.80 0.91 0.83 1.02  CALCIUM 8.9 9.3 9.2 -- --  MG -- -- -- -- --  PHOS -- -- -- -- --    Liver Function Tests:  Lab 12/17/11 0851 12/16/11 2239  AST 23 25  ALT 23 26  ALKPHOS 78 74  BILITOT 0.7 0.4  PROT 8.2 8.5*  ALBUMIN 3.3* 3.2*   No results found for this basename: LIPASE:5,AMYLASE:5 in the last 168 hours  Lab 12/16/11 2246  AMMONIA <10*    CBC:  Lab 12/22/11 0625 12/19/11 0725 12/17/11 0851 12/16/11 2239  WBC 8.1 8.7 11.0* 10.1  NEUTROABS -- -- -- 6.1  HGB 12.5* 13.3 13.6 14.3  HCT 38.1* 39.2 39.3 41.7  MCV 93.4 91.6 92.0 93.5  PLT 411* 537* 523* 526*    Cardiac Enzymes: No results found for this basename: CKTOTAL:5,CKMB:5,CKMBINDEX:5,TROPONINI:5 in the last 168 hours  Lipid Panel: No results found for this basename: CHOL:5,TRIG:5,HDL:5,CHOLHDL:5,VLDL:5,LDLCALC:5 in the last 168 hours  CBG:  Lab 12/16/11 2148  GLUCAP 110*    Microbiology: Results for orders placed during the hospital encounter of 11/29/11  SURGICAL PCR SCREEN     Status: Normal   Collection Time   11/29/11  3:36  PM      Component Value Range Status Comment   MRSA, PCR NEGATIVE  NEGATIVE Final    Staphylococcus aureus NEGATIVE  NEGATIVE Final     Coagulation Studies: No results found for this basename: LABPROT:5,INR:5 in the last 72 hours  Imaging: No results found.  Medications:  I have reviewed the patient's current medications. Scheduled:   . acyclovir  10 mg/kg (Ideal) Intravenous Q8H  . aspirin EC  81 mg Oral Daily  . clonazePAM  0.5 mg Oral BID  . guaiFENesin  600 mg Oral BID  . heparin  5,000 Units Subcutaneous Q8H  . metoprolol tartrate  50 mg Oral BID  . neomycin-bacitracin-polymyxin    Topical BID  . predniSONE  60 mg Oral Q breakfast  . QUEtiapine  12.5 mg Oral QHS  . rosuvastatin  10 mg Oral q1800  . sodium chloride  3 mL Intravenous Q12H  . Tamsulosin HCl  0.4 mg Oral Daily  . DISCONTD: methylPREDNISolone (SOLU-MEDROL) injection  125 mg Intravenous Q12H  . DISCONTD: metoprolol tartrate  25 mg Oral BID    Assessment/Plan:  Patient Active Hospital Problem List: Confusion with non-focal neuro exam (12/17/2011)   Assessment: Patient now on oral Prednisone.  Confusion improved.  Sleeping better.  Has responded well to Seroquel.     Plan: 1. Continue Seroquel prn  2. In am decrease Prednisone to 50mg .  Would decreased by 10mg  QOD until off.      LOS: 6 days   Thana Farr, MD [facility] [phone] 12/22/2011  11:14 AM

## 2011-12-23 DIAGNOSIS — I6789 Other cerebrovascular disease: Secondary | ICD-10-CM

## 2011-12-23 MED ORDER — PRASUGREL HCL 10 MG PO TABS
10.0000 mg | ORAL_TABLET | Freq: Every day | ORAL | Status: DC
  Administered 2011-12-23 – 2011-12-24 (×2): 10 mg via ORAL
  Filled 2011-12-23 (×2): qty 1

## 2011-12-23 NOTE — Progress Notes (Signed)
PATIENT DETAILS Name: Tyler Mayo Age: 71 y.o. Sex: male Date of Birth: December 20, 1940 Admit Date: 12/16/2011 Admitting Physician Huey Bienenstock, MD [id number] Freida Busman, MD  Subjective: Mental status continues to improve  Assessment/Plan: Active Problems: AMS -mostly in the form of confusion-now a whole lot better -Spoke with Dr Hillary Bow probability of Encephalitis-does not recommend LP or continued treatment with Acyclovir-will therefore stop (started 9/10) -plan is to taper prednisone by 10 every other day -continue with Serquel  Anxiety  -c/w Klonopin  ?Urinary Retention -Foley removed 9/9 -able to urinate following foley removal -?BPH-c/w Flomax   DEPRESSION -c/ Seroquel-outpatient Psych follow up   CAD (coronary artery disease) -no chest pain or SOB -last PCI earlier this year -c/w ASA, resume Effient today-no plans to do LP -c/w Statin and Metoprolol as well  Sinus Tachycardia -likely 2/2 anxiety and pain-rate much better -await echo -TSH earlier this admit-normal -restarting Norco for pain -monitor closely   Hyperlipidemia -c/w Statin   Pulmonary nodules -s/p VATS and biopsy last month -unclear about the biopsy results-so spoke with Dr [name] today-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   Gen Exam: Awake and Mostly  Neck: Supple, No JVD.  Chest: B/L  Clear.  No rales CVS: S1 S2 Regular, no murmurs.  Abdomen: soft, BS +, non tender, non distended.  Extremities: no edema, lower extremities warm to touch. Neurologic: Non Focal.   Skin: No Rash.   Wounds: N/A.    Intake/Output from previous day: No intake or output data in the 24 hours ending 12/23/11 1537   LAB RESULTS: CBC  Lab 12/22/11 0625 12/19/11 0725 12/17/11 0851 12/16/11 2239  WBC 8.1 8.7 11.0* 10.1  HGB 12.5* 13.3 13.6 14.3  HCT 38.1* 39.2 39.3 41.7  PLT 411* 537* 523* 526*  MCV 93.4 91.6 92.0 93.5  MCH 30.6 31.1 31.9 32.1  MCHC 32.8 33.9 34.6 34.3  RDW 14.5 13.9 13.9 14.0  LYMPHSABS -- -- -- 2.7  MONOABS -- -- -- 0.8  EOSABS -- -- -- 0.5  BASOSABS -- -- -- 0.1  BANDABS -- -- -- --    Chemistries   Lab 12/22/11 0625 12/20/11 0555 12/19/11 0725 12/17/11 0851 12/16/11 2239  NA 139 136 137 132* 135  K 3.5 3.6 3.7 3.8 4.1  CL 104 101 102 96 100  CO2 24 24 25 24 25   GLUCOSE 75 145* 101* 121* 96  BUN 10 8 6 9 13   CREATININE 0.89 0.80 0.91 0.83 1.02  CALCIUM 8.9 9.3 9.2 9.2 9.6  MG -- -- -- -- --    CBG:  Lab 12/16/11 2148  GLUCAP 110*    GFR Estimated Creatinine Clearance: 92.6 ml/min (by C-G formula based on Cr of 0.89).  Coagulation profile  Lab 12/16/11 2239  INR  1.17  PROTIME --    Cardiac Enzymes No results found for this basename: CK:3,CKMB:3,TROPONINI:3,MYOGLOBIN:3 in the last 168 hours  No components found with this basename: POCBNP:3 No results found for this basename: DDIMER:2 in the last 72 hours No results found for this basename: HGBA1C:2 in the last 72 hours No results found for this basename: CHOL:2,HDL:2,LDLCALC:2,TRIG:2,CHOLHDL:2,LDLDIRECT:2 in the last 72 hours No results found for this basename: TSH,T4TOTAL,FREET3,T3FREE,THYROIDAB in the last 72 hours No results found for this basename: VITAMINB12:2,FOLATE:2,FERRITIN:2,TIBC:2,IRON:2,RETICCTPCT:2 in the last 72 hours No results found for this basename: LIPASE:2,AMYLASE:2 in  the last 72 hours  Urine Studies No results found for this basename: UACOL:2,UAPR:2,USPG:2,UPH:2,UTP:2,UGL:2,UKET:2,UBIL:2,UHGB:2,UNIT:2,UROB:2,ULEU:2,UEPI:2,UWBC:2,URBC:2,UBAC:2,CAST:2,CRYS:2,UCOM:2,BILUA:2 in the last 72 hours  MICROBIOLOGY: No results found for this or any previous visit (from the past 240 hour(s)).  RADIOLOGY STUDIES/RESULTS: Dg Chest 2 View  12/06/2011  *RADIOLOGY REPORT*  Clinical Data: Right lung mass, status post VATS  CHEST - 2 VIEW  Comparison: 12/05/2011  Findings: Patchy right lower lobe opacity, possibly atelectasis. Small bilateral pleural effusions.  No pneumothorax.  The heart is mildly enlarged, unchanged.  Mild degenerative changes of the visualized thoracolumbar spine. Cervical spine fixation hardware.  IMPRESSION: No pneumothorax is seen.  Suspected right lower lobe atelectasis with small bilateral pleural effusions.   Original Report Authenticated By: Charline Bills, M.D.    Dg Chest 2 View  12/03/2011  *RADIOLOGY REPORT*  Clinical Data: Preop radiograph.  CHEST - 2 VIEW  Comparison: 05/12/2011  Findings: Heart size and mediastinal contours are unremarkable.  No pleural effusion or pulmonary edema identified.  No airspace consolidation identified.  IMPRESSION:  1.  No acute cardiopulmonary abnormalities.   Original Report Authenticated By: Rosealee Albee, M.D.    Ct Head Wo Contrast  12/16/2011  *RADIOLOGY REPORT*  Clinical Data: Altered mental status.  Difficulty with speech.  CT HEAD WITHOUT CONTRAST  Technique:  Contiguous axial images were obtained from the base of the skull through the vertex without contrast.  Comparison: None.  Findings: Mild diffuse cerebral atrophy.  Ventricular dilatation consistent with central atrophy.  No mass effect or midline shift. No abnormal extra-axial fluid collections.  Low attenuation changes in the deep white matter consistent with patchy small vessel ischemic change.  Gray-white matter junctions are distinct.  Basal  cisterns are not effaced.  No evidence of acute intracranial hemorrhage.  No depressed skull fractures.  Visualized paranasal sinuses and mastoid air cells are not opacified.  IMPRESSION: No acute intracranial abnormalities.   Original Report Authenticated By: Marlon Pel, M.D.    Dg Chest Port 1 View  12/16/2011  *RADIOLOGY REPORT*  Clinical Data: Altered mental status.  Recent VATS.  PORTABLE CHEST - 1 VIEW  Comparison: 12/06/2011.  Findings: Linear and patchy density at the right lung base with improvement.  Minimal right pleural fluid, also improved.  The left lateral costophrenic angle is not currently included.  No visible pleural fluid on the left.  The included left lung is clear. Stable cervical spine fixation hardware.  IMPRESSION:  1.  Improving right basilar atelectasis and possible pneumonia. 2.  Small right pleural effusion, decreased.   Original Report Authenticated By: Darrol Angel, M.D.    Dg Chest Port 1 View  12/05/2011  *RADIOLOGY REPORT*  Clinical Data: Chest tube removal  PORTABLE CHEST - 1 VIEW  Comparison: Chest radiograph 11/27/2011 at 6:20 hours  Findings: Interval removal of right chest tube.  No evidence of pneumothorax.  There is persistent bibasilar atelectasis,  right greater on the right.  Stable enlarged heart silhouette.  IMPRESSION: No evidence of pneumothorax following right chest tube removal.  Bibasilar atelectasis.   Original Report Authenticated By: Genevive Bi, M.D.    Dg Chest Port 1 View  12/05/2011  *RADIOLOGY REPORT*  Clinical Data: Lung mass, VATS.  PORTABLE CHEST - 1 VIEW  Comparison: 12/04/2011 and CT chest 11/13/2011.  Findings: Trachea is midline.  Right IJ central line tip projects over the SVC.  Right chest tube remains in place at the apex of the right hemithorax.  Heart is enlarged, stable. Postoperative changes in the right hemithorax.  Lungs are low volume with diffuse interstitial prominence and indistinctness.  No pneumothorax. Bibasilar  air space disease, right greater than left.  Possible tiny right pleural effusion.  IMPRESSION:  1.  No pneumothorax with right chest tube in place. 2.  Postoperative changes in the right hemithorax with stable right basilar airspace disease and possible tiny right pleural effusion. 3.  Mild diffuse interstitial prominence may be due to vascular crowding.  Pulmonary edema cannot be excluded. 4.  Left basilar airspace disease, stable.   Original Report Authenticated By: Reyes Ivan, M.D.    Dg Chest Port 1 View  12/04/2011  *RADIOLOGY REPORT*  Clinical Data: Chest tube placement.  Shortness of breath.  PORTABLE CHEST - 1 VIEW  Comparison: Chest x-ray 12/03/2011.  Findings: There is a right-sided internal jugular central venous catheter with tip terminating in the distal superior vena cava. Right-sided chest tube in position with tip and sideport projecting over the right hemithorax.  No appreciable pneumothorax is identified on today's examination.  Lung volumes are low with bibasilar linear opacities favored to reflect areas of subsegmental atelectasis.  Postoperative changes of right middle lobe wedge resection are again noted with persistent ill-defined opacity in this region, likely reflects a resolving postoperative hemorrhage and atelectasis.  Pulmonary vascular crowding accentuated by low lung volumes, without frank pulmonary edema.  Heart size is borderline enlarged (likely accentuated by low lung volumes). Mediastinal contours appear widened, likely accentuated by low lung volumes, rightward rotation and lordotic positioning.  IMPRESSION: 1.  Persistent low lung volumes with bibasilar areas of atelectasis and postoperative changes in the right middle lobe, as above. 2.  Support apparatus, as above.   Original Report Authenticated By: Florencia Reasons, M.D.    Dg Chest Portable 1 View  12/03/2011  *RADIOLOGY REPORT*  Clinical Data: Postop.  PORTABLE CHEST - 1 VIEW  Comparison: Earlier today.   Findings: Interval right chest tube with its tip at the medial right lung apex.  No pneumothorax.  Interval right jugular catheter tip in the superior vena cava.  Interval linear densities in both lower lung zones with a decreased inspiration.  The cardiac silhouette currently appears mildly enlarged, magnified by the decreased inspiration.  Cervical spine fixation hardware is unchanged.  IMPRESSION:  1.  Poor inspiration with mild bibasilar atelectasis. 2.  Right chest tube without pneumothorax.   Original Report Authenticated By: Darrol Angel, M.D.     MEDICATIONS: Scheduled Meds:    . acyclovir  10 mg/kg (Ideal) Intravenous Q8H  . aspirin EC  81 mg Oral Daily  . clonazePAM  0.5 mg Oral BID  . guaiFENesin  600 mg Oral BID  . heparin  5,000 Units Subcutaneous Q8H  . metoprolol tartrate  50 mg Oral BID  . neomycin-bacitracin-polymyxin   Topical BID  . predniSONE  50 mg Oral Q breakfast  . QUEtiapine  12.5 mg Oral QHS  . rosuvastatin  10 mg Oral q1800  . sodium chloride  3 mL Intravenous Q12H  . Tamsulosin HCl  0.4 mg Oral Daily   Continuous Infusions:    . sodium chloride 50 mL/hr (12/22/11 1240)   PRN Meds:.acetaminophen, acetaminophen, alum & mag hydroxide-simeth, clonazePAM, clonazePAM, diphenhydrAMINE, haloperidol lactate, HYDROcodone-acetaminophen, nitroGLYCERIN, ondansetron (ZOFRAN) IV, ondansetron, polyethylene glycol  Antibiotics: Anti-infectives     Start     Dose/Rate Route Frequency Ordered Stop   12/18/11 2000   acyclovir (ZOVIRAX) 730 mg in dextrose 5 % 150 mL IVPB        10 mg/kg  73 kg (Ideal) 164.6 mL/hr over 60 Minutes Intravenous 3 times per day 12/18/11 1849     12/17/11 1000   doxycycline (VIBRAMYCIN) 50 MG capsule 50 mg  Status:  Discontinued     Comments: Use for Viibryd 40 mg      50 mg Oral Daily 12/17/11 0237 12/17/11 0854           Jeoffrey Massed, MD  [facility] Pager:[phone]  If 7PM-7AM, please contact  night-coverage [email or web address] Password [email or web address] 12/23/2011, 3:37 PM   LOS: 7 days

## 2011-12-23 NOTE — Progress Notes (Signed)
Seems to be doing much better  Able to converse normally, remembers some details of watching golf yesterday

## 2011-12-23 NOTE — Progress Notes (Signed)
Subjective: Patient conversant and appropriate.  Only impediment to resting well overnight was frequent urination.    Objective: Current vital signs: BP 138/73  Pulse 89  Temp 98.4 F (36.9 C) (Oral)  Resp 16  Ht 5\' 10"  (1.778 m)  Wt 99.4 kg (219 lb 2.2 oz)  BMI 31.44 kg/m2  SpO2 94% Vital signs in last 24 hours: Temp:  [98 F (36.7 C)-98.4 F (36.9 C)] 98.4 F (36.9 C) (09/15 0650) Pulse Rate:  [89-103] 89  (09/15 0650) Resp:  [16-18] 16  (09/15 0650) BP: (130-138)/(73-74) 138/73 mmHg (09/15 0650) SpO2:  [94 %-96 %] 94 % (09/15 0650)  Intake/Output from previous day: 09/14 0701 - 09/15 0700 In: 240 [P.O.:240] Out: -  Intake/Output this shift:   Nutritional status: Cardiac  Neurologic Exam: Mental Status:  Alert, oriented, thought content appropriate. More emotionally stable. Speech fluent without evidence of aphasia but has significant word finding difficulites. Able to follow 3 step commands without difficulty.  Cranial Nerves:  II: Discs flat bilaterally; Visual fields grossly normal, pupils equal, round, reactive to light and accommodation  III,IV, VI: ptosis not present, extra-ocular motions intact bilaterally  V,VII: smile symmetric, facial light touch sensation normal bilaterally  VIII: hearing normal bilaterally  IX,X: gag reflex present  XI: bilateral shoulder shrug  XII: midline tongue extension  Motor:  Right : Upper extremity 5/5        Left: Upper extremity 5/5   Lower extremity 5/5     Lower extremity 5/5  Tone and bulk:normal tone throughout; no atrophy noted. Low amplitude, high frequency tremor noted in the feet bilaterally  Sensory: Pinprick and light touch intact throughout, bilaterally  Deep Tendon Reflexes: 2+ and symmetric throughout  Plantars:  Right: downgoing       Left: downgoing  Cerebellar:  normal finger-to-nose and normal heel-to-shin test  Gait: normal gait and station.  Transfers independently.      Lab Results: Basic Metabolic  Panel:  Lab 12/22/11 0625 12/20/11 0555 12/19/11 0725 12/17/11 0851 12/16/11 2239  NA 139 136 137 132* 135  K 3.5 3.6 3.7 3.8 4.1  CL 104 101 102 96 100  CO2 24 24 25 24 25   GLUCOSE 75 145* 101* 121* 96  BUN 10 8 6 9 13   CREATININE 0.89 0.80 0.91 0.83 1.02  CALCIUM 8.9 9.3 9.2 -- --  MG -- -- -- -- --  PHOS -- -- -- -- --    Liver Function Tests:  Lab 12/17/11 0851 12/16/11 2239  AST 23 25  ALT 23 26  ALKPHOS 78 74  BILITOT 0.7 0.4  PROT 8.2 8.5*  ALBUMIN 3.3* 3.2*   No results found for this basename: LIPASE:5,AMYLASE:5 in the last 168 hours  Lab 12/16/11 2246  AMMONIA <10*    CBC:  Lab 12/22/11 0625 12/19/11 0725 12/17/11 0851 12/16/11 2239  WBC 8.1 8.7 11.0* 10.1  NEUTROABS -- -- -- 6.1  HGB 12.5* 13.3 13.6 14.3  HCT 38.1* 39.2 39.3 41.7  MCV 93.4 91.6 92.0 93.5  PLT 411* 537* 523* 526*    Cardiac Enzymes: No results found for this basename: CKTOTAL:5,CKMB:5,CKMBINDEX:5,TROPONINI:5 in the last 168 hours  Lipid Panel: No results found for this basename: CHOL:5,TRIG:5,HDL:5,CHOLHDL:5,VLDL:5,LDLCALC:5 in the last 168 hours  CBG:  Lab 12/16/11 2148  GLUCAP 110*    Microbiology: Results for orders placed during the hospital encounter of 11/29/11  SURGICAL PCR SCREEN     Status: Normal   Collection Time   11/29/11  3:36 PM  Component Value Range Status Comment   MRSA, PCR NEGATIVE  NEGATIVE Final    Staphylococcus aureus NEGATIVE  NEGATIVE Final     Coagulation Studies: No results found for this basename: LABPROT:5,INR:5 in the last 72 hours  Imaging: No results found.  Medications:  I have reviewed the patient's current medications. Scheduled:   . acyclovir  10 mg/kg (Ideal) Intravenous Q8H  . aspirin EC  81 mg Oral Daily  . clonazePAM  0.5 mg Oral BID  . guaiFENesin  600 mg Oral BID  . heparin  5,000 Units Subcutaneous Q8H  . metoprolol tartrate  50 mg Oral BID  . neomycin-bacitracin-polymyxin   Topical BID  . predniSONE  50 mg Oral Q  breakfast  . QUEtiapine  12.5 mg Oral QHS  . rosuvastatin  10 mg Oral q1800  . sodium chloride  3 mL Intravenous Q12H  . Tamsulosin HCl  0.4 mg Oral Daily  . DISCONTD: predniSONE  50 mg Oral Q breakfast  . DISCONTD: predniSONE  60 mg Oral Q breakfast    Assessment/Plan:  Patient Active Hospital Problem List: Confusion with non-focal neuro exam (12/17/2011)   Assessment: Tolerating Prednisone taper.  Sleeping with Seroquel nightly   Plan: Agree with continued Seroquel and Prednisone taper.      LOS: 7 days   Thana Farr, MD [facility] [phone] 12/23/2011  11:24 AM

## 2011-12-23 NOTE — Progress Notes (Signed)
  Echocardiogram 2D Echocardiogram has been performed.  DEVONTAY, GERSTENBERGER 12/23/2011, 3:15 PM

## 2011-12-24 ENCOUNTER — Telehealth: Payer: Self-pay | Admitting: Cardiovascular Disease

## 2011-12-24 LAB — BASIC METABOLIC PANEL
BUN: 12 mg/dL (ref 6–23)
CO2: 28 mEq/L (ref 19–32)
Chloride: 103 mEq/L (ref 96–112)
GFR calc Af Amer: >90 mL/min (ref >90)
Potassium: 3 mEq/L — ABNORMAL LOW (ref 3.5–5.1)

## 2011-12-24 MED ORDER — METOPROLOL TARTRATE 50 MG PO TABS: 50.0000 mg | ORAL_TABLET | Freq: Two times a day (BID) | ORAL | Status: DC

## 2011-12-24 MED ORDER — PREDNISONE 10 MG PO TABS: ORAL_TABLET | ORAL | Status: DC

## 2011-12-24 MED ORDER — CLONAZEPAM 1 MG PO TABS: 1.0000 mg | ORAL_TABLET | Freq: Every evening | ORAL | Status: DC | PRN

## 2011-12-24 MED ORDER — TAMSULOSIN HCL 0.4 MG PO CAPS: 0.4000 mg | ORAL_CAPSULE | Freq: Every day | ORAL | Status: DC

## 2011-12-24 MED ORDER — POTASSIUM CHLORIDE CRYS ER 20 MEQ PO TBCR
40.0000 meq | EXTENDED_RELEASE_TABLET | Freq: Once | ORAL | Status: AC
  Administered 2011-12-24: 40 meq via ORAL
  Filled 2011-12-24: qty 2

## 2011-12-24 MED ORDER — QUETIAPINE 12.5 MG HALF TABLET: 12.5000 mg | ORAL_TABLET | Freq: Every day | ORAL | Status: DC

## 2011-12-24 MED ORDER — CLONAZEPAM 0.5 MG PO TABS: 0.5000 mg | ORAL_TABLET | Freq: Two times a day (BID) | ORAL | Status: DC | PRN

## 2011-12-24 MED ORDER — DIPHENHYDRAMINE HCL 25 MG PO CAPS: 25.0000 mg | ORAL_CAPSULE | Freq: Every evening | ORAL | Status: DC | PRN

## 2011-12-24 NOTE — Telephone Encounter (Signed)
PER PT'S WIFE PT DID NOT TOLERATE LIPITOR  OR  CRESTOR AS OF RECENTLY  WILL FORWARD TO DR Eden Emms FOR REVIEW./CY

## 2011-12-24 NOTE — Discharge Summary (Signed)
PATIENT DETAILS Name: Tyler Mayo Age: 71 y.o. Sex: male Date of Birth: 12/12/1940 MRN: [id number]. Admit Date: 12/16/2011 Admitting Physician: Huey Bienenstock, MD WUJ:WJXB,JYNWGNF ALLEN, MD  Recommendations for Outpatient Follow-up:  1. Close follow up of cognitive function)  PRIMARY DISCHARGE DIAGNOSIS:  Active Problems:  Confusion with non-focal neuro exam  DEPRESSION  CAD (coronary artery disease)  Hyperlipidemia  Pulmonary nodules-Low grade Lymphoma  Sinus Tachycardia  Urinary Retention     PAST MEDICAL HISTORY: Past Medical History  Diagnosis Date  . Lung nodules     s/p bx with Dr. [name] 9/12  . CAD (coronary artery disease)     a.  8.2012 neg MV;  b.  05/12/2011 Inf STEMI - 100% RCA - 3.5x22 Resolute DES, Residual 80% LAD dzs. c. s/p planned DES to LAD 06/01/11.  Marland Kitchen Hyperlipidemia     takes Crestor daily  . History of tobacco abuse   . Myocardial infarction 05/2011  . Joint pain   . Chronic back pain   . Bruises easily     takes Effient daily;stopped on 11/26/11  . Enlarged prostate   . Depression with anxiety     takes Vibryd daily and Clonazepam prn  . Erectile dysfunction   . Depression   . Insomnia     takes an OTC sleep aide    DISCHARGE MEDICATIONS:   Medication List     As of 12/24/2011 10:25 AM    STOP taking these medications         gabapentin 100 MG capsule   Commonly known as: NEURONTIN      guaiFENesin 600 MG 12 hr tablet   Commonly known as: MUCINEX      VIIBRYD 40 MG Tabs   Generic drug: Vilazodone HCl      TAKE these medications         aspirin EC 81 MG tablet   Take 81 mg by mouth daily.      clonazePAM 0.5 MG tablet   Commonly known as: KLONOPIN   Take 1 tablet (0.5 mg total) by mouth 2 (two) times daily as needed. For anxiety.      clonazePAM 1 MG tablet   Commonly known as: KLONOPIN   Take 1 tablet (1 mg total) by mouth at bedtime as needed (INSOMNIA).      diphenhydrAMINE 25 mg capsule   Commonly known as:  BENADRYL   Take 1 capsule (25 mg total) by mouth at bedtime as needed for sleep.      HYDROcodone-acetaminophen 5-325 MG per tablet   Commonly known as: NORCO/VICODIN   Take 1 tablet by mouth every 4 (four) hours as needed. For pain      metoprolol 50 MG tablet   Commonly known as: LOPRESSOR   Take 1 tablet (50 mg total) by mouth 2 (two) times daily.      nitroGLYCERIN 0.4 MG SL tablet   Commonly known as: NITROSTAT   Place 0.4 mg under the tongue every 5 (five) minutes as needed. For chest pain      prasugrel 10 MG Tabs   Commonly known as: EFFIENT   Take 10 mg by mouth daily.      predniSONE 10 MG tablet   Commonly known as: DELTASONE   Take 50 mg daily, decrease dose by 10 mg every OTHER day till gone      QUEtiapine 12.5 mg Tabs   Commonly known as: SEROQUEL   Take 0.5 tablets (12.5 mg total) by mouth at bedtime.  rosuvastatin 20 MG tablet   Commonly known as: CRESTOR   Take 10 mg by mouth at bedtime.      Tamsulosin HCl 0.4 MG Caps   Commonly known as: FLOMAX   Take 1 capsule (0.4 mg total) by mouth daily.          BRIEF HPI:  See H&P, Labs, Consult and Test reports for all details in brief, patient was admitted for confusion. Retrospectively ever since discharge from the hospital last month-patient was never returned to his baseline mental status. However upon further questioning the patient's spouse he may have had some confusion even prior to the VATS procedure.The confusion actually worsened the Sunday prior to admission and as a result he was admitted to the hospital for further evaluation and treatment.  CONSULTATIONS:   {neurology PERTINENT RADIOLOGIC STUDIES: Ct Angio Head W/cm &/or Wo Cm  12/19/2011  *RADIOLOGY REPORT*  Clinical Data:  71 year old male with altered mental status, abnormal speech, dizziness.  CT ANGIOGRAPHY HEAD  Technique:  Multidetector CT imaging of the head was performed using the standard protocol during bolus administration of  intravenous contrast.  Multiplanar CT image reconstructions including MIPs were obtained to evaluate the vascular anatomy.  Contrast: 50mL OMNIPAQUE IOHEXOL 350 MG/ML SOLN  Comparison:  Brain MRIs 0949 hours the same day and 12/17/2011. Head CT without contrast 12/16/2011.  Findings:  Lower cervical ACDF hardware on the scout view.  Stable cerebral volume. Stable ventricle size and configuration. No midline shift, mass effect, or evidence of mass lesion.  No acute intracranial hemorrhage identified.  No evidence of cortically based acute infarction identified.  No abnormal enhancement identified.  No acute osseous abnormality identified. Visualized paranasal sinuses and mastoids are clear.  Vascular Findings: Major intracranial venous structures are enhancing.  Codominant distal vertebral arteries are patent.  Normal PICA vessels.  Normal vertebrobasilar junction.  No basilar artery stenosis.  SCA and PCA origins are within normal limits.  Small bilateral posterior communicating arteries.  Bilateral PCA branches are within normal limits.  Mild to moderate tortuosity of the distal cervical right ICA. Patent right ICA siphon with minimal atherosclerosis.  Normal right ophthalmic artery origin.  Normal right posterior communicating artery origin. Patent left ICA siphon with mild intermittent calcified plaque.  No stenosis.  Left ophthalmic and posterior communicating artery origins are normal.  The normal carotid termini.  Normal MCA and ACA origins.  Normal anterior communicating artery.  Bilateral ACA branches are within normal limits.  Bilateral MCA branches are within normal limits.   Review of the MIP images confirms the above findings.  IMPRESSION: 1.  Normal for age intracranial CTA. 2. No acute intracranial abnormality.   Original Report Authenticated By: Harley Hallmark, M.D.    Dg Chest 2 View  12/06/2011  *RADIOLOGY REPORT*  Clinical Data: Right lung mass, status post VATS  CHEST - 2 VIEW  Comparison:  12/05/2011  Findings: Patchy right lower lobe opacity, possibly atelectasis. Small bilateral pleural effusions.  No pneumothorax.  The heart is mildly enlarged, unchanged.  Mild degenerative changes of the visualized thoracolumbar spine. Cervical spine fixation hardware.  IMPRESSION: No pneumothorax is seen.  Suspected right lower lobe atelectasis with small bilateral pleural effusions.   Original Report Authenticated By: Charline Bills, M.D.    Dg Chest 2 View  12/03/2011  *RADIOLOGY REPORT*  Clinical Data: Preop radiograph.  CHEST - 2 VIEW  Comparison: 05/12/2011  Findings: Heart size and mediastinal contours are unremarkable.  No pleural effusion or pulmonary edema  identified.  No airspace consolidation identified.  IMPRESSION:  1.  No acute cardiopulmonary abnormalities.   Original Report Authenticated By: Rosealee Albee, M.D.    Ct Head Wo Contrast  12/16/2011  *RADIOLOGY REPORT*  Clinical Data: Altered mental status.  Difficulty with speech.  CT HEAD WITHOUT CONTRAST  Technique:  Contiguous axial images were obtained from the base of the skull through the vertex without contrast.  Comparison: None.  Findings: Mild diffuse cerebral atrophy.  Ventricular dilatation consistent with central atrophy.  No mass effect or midline shift. No abnormal extra-axial fluid collections.  Low attenuation changes in the deep white matter consistent with patchy small vessel ischemic change.  Gray-white matter junctions are distinct.  Basal cisterns are not effaced.  No evidence of acute intracranial hemorrhage.  No depressed skull fractures.  Visualized paranasal sinuses and mastoid air cells are not opacified.  IMPRESSION: No acute intracranial abnormalities.   Original Report Authenticated By: Marlon Pel, M.D.    Mr Brain Wo Contrast  12/19/2011  *RADIOLOGY REPORT*  Clinical Data: 71 year old male with altered mental status, abnormal speech.  MRI HEAD WITHOUT CONTRAST  Technique:  Multiplanar, multiecho  pulse sequences of the brain and surrounding structures were obtained according to standard protocol without intravenous contrast.  Comparison: Brain MRI 12/17/2011 and earlier.  Findings: Similar degree of motion artifact intermittently noted throughout today's exam.  Mildly heterogeneous diffusion with no convincing diffusion restriction to suggest acute infarct.  The previous to foci do not seem to persist.  Major intracranial vascular flow voids are stable.  No midline shift, ventriculomegaly, mass effect, evidence of mass lesion, extra-axial collection or acute intracranial hemorrhage. Cervicomedullary junction and pituitary are within normal limits.  Stable gray and white matter signal, with mild for age scattered cerebral white matter T2 and FLAIR hyperintensity.  The negative visualized cervical spine.  Normal marrow signal.  Stable orbits. Visualized paranasal sinuses and mastoids are clear. Negative scalp soft tissues.  IMPRESSION: No convincing acute infarct or acute intracranial abnormality.   Original Report Authenticated By: Harley Hallmark, M.D.    Mr Laqueta Jean Wo Contrast  12/17/2011  *RADIOLOGY REPORT*  Clinical Data: Altered mental status.  Difficulty with speech. Recent lung surgery.  MRI HEAD WITHOUT AND WITH CONTRAST  Technique:  Multiplanar, multiecho pulse sequences of the brain and surrounding structures were obtained according to standard protocol without and with intravenous contrast  Contrast: 20mL MULTIHANCE GADOBENATE DIMEGLUMINE 529 MG/ML IV SOLN  Comparison: 12/16/2011 head CT.  04/22/2009 brain MR  Findings: Motion degraded exam.  Artifact extends through the posterior frontal lobes on the diffusion sequence.  There are however, two tiny punctate left frontal lobe hyperintensities which may represent tiny acute infarct.  Small vessel disease type changes.  No intracranial hemorrhage.  Global atrophy.  Ventricular prominence probably related atrophy rather hydrocephalus and without  change.  No intracranial mass lesion or abnormal enhancement noted on this motion degraded exam.  Major intracranial vascular structures are patent.  Left maxillary sinus mild polypoid opacification.  IMPRESSION: Exam limited by motion degradation.  Question two punctate infarcts left frontal lobe.  Please see above.   Original Report Authenticated By: Fuller Canada, M.D.    Dg Chest Port 1 View  12/16/2011  *RADIOLOGY REPORT*  Clinical Data: Altered mental status.  Recent VATS.  PORTABLE CHEST - 1 VIEW  Comparison: 12/06/2011.  Findings: Linear and patchy density at the right lung base with improvement.  Minimal right pleural fluid, also improved.  The  left lateral costophrenic angle is not currently included.  No visible pleural fluid on the left.  The included left lung is clear. Stable cervical spine fixation hardware.  IMPRESSION:  1.  Improving right basilar atelectasis and possible pneumonia. 2.  Small right pleural effusion, decreased.   Original Report Authenticated By: Darrol Angel, M.D.    Dg Chest Port 1 View  12/05/2011  *RADIOLOGY REPORT*  Clinical Data: Chest tube removal  PORTABLE CHEST - 1 VIEW  Comparison: Chest radiograph 11/27/2011 at 6:20 hours  Findings: Interval removal of right chest tube.  No evidence of pneumothorax.  There is persistent bibasilar atelectasis,  right greater on the right.  Stable enlarged heart silhouette.  IMPRESSION: No evidence of pneumothorax following right chest tube removal.  Bibasilar atelectasis.   Original Report Authenticated By: Genevive Bi, M.D.    Dg Chest Port 1 View  12/05/2011  *RADIOLOGY REPORT*  Clinical Data: Lung mass, VATS.  PORTABLE CHEST - 1 VIEW  Comparison: 12/04/2011 and CT chest 11/13/2011.  Findings: Trachea is midline.  Right IJ central line tip projects over the SVC.  Right chest tube remains in place at the apex of the right hemithorax.  Heart is enlarged, stable. Postoperative changes in the right hemithorax.  Lungs are low  volume with diffuse interstitial prominence and indistinctness.  No pneumothorax. Bibasilar air space disease, right greater than left.  Possible tiny right pleural effusion.  IMPRESSION:  1.  No pneumothorax with right chest tube in place. 2.  Postoperative changes in the right hemithorax with stable right basilar airspace disease and possible tiny right pleural effusion. 3.  Mild diffuse interstitial prominence may be due to vascular crowding.  Pulmonary edema cannot be excluded. 4.  Left basilar airspace disease, stable.   Original Report Authenticated By: Reyes Ivan, M.D.    Dg Chest Port 1 View  12/04/2011  *RADIOLOGY REPORT*  Clinical Data: Chest tube placement.  Shortness of breath.  PORTABLE CHEST - 1 VIEW  Comparison: Chest x-ray 12/03/2011.  Findings: There is a right-sided internal jugular central venous catheter with tip terminating in the distal superior vena cava. Right-sided chest tube in position with tip and sideport projecting over the right hemithorax.  No appreciable pneumothorax is identified on today's examination.  Lung volumes are low with bibasilar linear opacities favored to reflect areas of subsegmental atelectasis.  Postoperative changes of right middle lobe wedge resection are again noted with persistent ill-defined opacity in this region, likely reflects a resolving postoperative hemorrhage and atelectasis.  Pulmonary vascular crowding accentuated by low lung volumes, without frank pulmonary edema.  Heart size is borderline enlarged (likely accentuated by low lung volumes). Mediastinal contours appear widened, likely accentuated by low lung volumes, rightward rotation and lordotic positioning.  IMPRESSION: 1.  Persistent low lung volumes with bibasilar areas of atelectasis and postoperative changes in the right middle lobe, as above. 2.  Support apparatus, as above.   Original Report Authenticated By: Florencia Reasons, M.D.    Dg Chest Portable 1 View  12/03/2011   *RADIOLOGY REPORT*  Clinical Data: Postop.  PORTABLE CHEST - 1 VIEW  Comparison: Earlier today.  Findings: Interval right chest tube with its tip at the medial right lung apex.  No pneumothorax.  Interval right jugular catheter tip in the superior vena cava.  Interval linear densities in both lower lung zones with a decreased inspiration.  The cardiac silhouette currently appears mildly enlarged, magnified by the decreased inspiration.  Cervical spine fixation hardware is  unchanged.  IMPRESSION:  1.  Poor inspiration with mild bibasilar atelectasis. 2.  Right chest tube without pneumothorax.   Original Report Authenticated By: Darrol Angel, M.D.      PERTINENT LAB RESULTS: CBC:  Basename 12/22/11 0625  WBC 8.1  HGB 12.5*  HCT 38.1*  PLT 411*   CMET CMP     Component Value Date/Time   NA 140 12/24/2011 0645   K 3.0* 12/24/2011 0645   CL 103 12/24/2011 0645   CO2 28 12/24/2011 0645   GLUCOSE 81 12/24/2011 0645   BUN 12 12/24/2011 0645   CREATININE 0.96 12/24/2011 0645   CALCIUM 9.2 12/24/2011 0645   PROT 8.2 12/17/2011 0851   ALBUMIN 3.3* 12/17/2011 0851   AST 23 12/17/2011 0851   ALT 23 12/17/2011 0851   ALKPHOS 78 12/17/2011 0851   BILITOT 0.7 12/17/2011 0851   GFRNONAA 83* 12/24/2011 0645   GFRAA >90 12/24/2011 0645    GFR Estimated Creatinine Clearance: 85.9 ml/min (by C-G formula based on Cr of 0.96). No results found for this basename: LIPASE:2,AMYLASE:2 in the last 72 hours No results found for this basename: CKTOTAL:3,CKMB:3,CKMBINDEX:3,TROPONINI:3 in the last 72 hours No components found with this basename: POCBNP:3 No results found for this basename: DDIMER:2 in the last 72 hours No results found for this basename: HGBA1C:2 in the last 72 hours No results found for this basename: CHOL:2,HDL:2,LDLCALC:2,TRIG:2,CHOLHDL:2,LDLDIRECT:2 in the last 72 hours No results found for this basename: TSH,T4TOTAL,FREET3,T3FREE,THYROIDAB in the last 72 hours No results found for this basename:  VITAMINB12:2,FOLATE:2,FERRITIN:2,TIBC:2,IRON:2,RETICCTPCT:2 in the last 72 hours Coags: No results found for this basename: PT:2,INR:2 in the last 72 hours Microbiology: No results found for this or any previous visit (from the past 240 hour(s)).   BRIEF HOSPITAL COURSE:   Active Problems:  AMS  -mostly in the form of confusion, on admission he was very delirious-requiring Haldol and Ativan Intravenously. He had no h/o fever, UA and CXR did not show any obvious infection, neck was supple. Initially medications (patient on Benzodiazepine/Narcotics) were thought to be the culprit, patient was restarted on Benzo's, narcotics were held. However patient did not make any clinical improvement. MRI brain was ordered-First MRI brain-did not show major abnormalities-there was motion artifacts, however question of tiny acute infarcts left frontal lobe were seen. It was thought that these were unrelated to his condition. Neurology was subsequently consulted, was initially seen by Dr Alonna Minium recommended starting Acyclovir empirically to cover HSV Encephalitis, LP could not be done right away-as patient was on ASA/Effient.He did not have any response to Acyclovir, Dr Thad Ranger saw him subsequently-and recommended to start Steroids. His Effient was held in anticipation of LP in case he needed it if he continued to have AMS.Seroquel was also started. He could not tolerate high doses of IV steroids-because of emotional lability and agitation, therefore was tapered to oral prednisone. With these, patient made improvement, his confusion and cognitive status is so much better, he still has some mild word finding difficulties.I spoke to Dr Jodi Mourning on 9/15-given low likelihood of this being HSV encephalitis-we stopped the Acyclovir on 9/15-he was observed for one more day-he continues to do well. No plans at this time to do a LP given clinical improvement. -Etiology of AMS is unknown at this point in time.During his  hospital course-MRI brain was repeated-the questionable left frontal lobe tiny infarcts were not seen, CT Angio of the head did not reveal any significant pathology.EEG was only suggestive of slowing. 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 -I had also spoken with patient's Rheumatologist-Dr [facility] had acutally repeated a autoimmune panel at his office which was negative, Dr Dierdre Forth did not think that AMS was autoimmune related. -current plans are to taper of prednisone by 10 mg every other day. [facility] has been contacted, patient's spouse's cell no has been passed on to them, along with the patient's demographics, they will call the patient for a follow up appointment.Patient will need close follow up post discharge -Long d/w patient and spouse at bedside today-and I have told them that I could not specify exactly what cause the AMS-but with clinical improvement he could go home and be followed closely as a outpatient.  Anxiety with Depression -c/w Klonopin and Seroquel -follow up with Psych MD as outpatinet  CAD (coronary artery disease)  -no chest pain or SOB  -last PCI earlier this year  -c/w ASA, resumed Effient-held initially given potential for LP this admit  Sinus Tachycardia  -likely 2/2 anxiety and pain-rate much better -Echo pending-can follow up as outpatient  Hyperlipidemia  -c/w Statin   Pulmonary nodules  -s/p VATS and biopsy last month  -unclear about the biopsy results-so spoke with Dr [name] today-final conclusion was a low grade lymphoma -patient to follow up with Dr [name] as outpatient  TODAY-DAY OF DISCHARGE:  Subjective:   Rhys Martini today has no headache,no chest abdominal pain,no new weakness tingling or numbness, feels much better wants to go home today.   Objective:   Blood pressure 137/85, pulse 80, temperature  98.3 F (36.8 C), temperature source Axillary, resp. rate 16, height 5\' 10"  (1.778 m), weight 99.4 kg (219 lb 2.2 oz), SpO2 95.00%.  Intake/Output Summary (Last 24 hours) at 12/24/11 1025 Last data filed at 12/23/11 1700  Gross per 24 hour  Intake    480 ml  Output      0 ml  Net    480 ml    Exam Awake Alert, Oriented *3, No new F.N deficits, Normal affect [place].AT,PERRAL Supple Neck,No JVD, No cervical lymphadenopathy appriciated.  Symmetrical Chest wall movement, Good air movement bilaterally, CTAB RRR,No Gallops,Rubs or new Murmurs, No Parasternal Heave +ve B.Sounds, Abd Soft, Non tender, No organomegaly appriciated, No rebound -guarding or rigidity. No Cyanosis, Clubbing or edema, No new Rash or bruise  DISCHARGE CONDITION: Stable  DISPOSITION:  HOME  DISCHARGE INSTRUCTIONS:    Activity:  As tolerated  Diet recommendation: Heart Healthy diet   Follow-up Information    Follow up with TODD,JEFFREY ALLEN, MD. Schedule an appointment as soon as possible for a visit in 1 week.   Contact information:   [address] [place] [address] [phone]       Follow up with [facility]. (Practice will call Ms Edwardson in the next 1-2 days with a follow up appointment)    Contact information:   [address] [place] [address] [phone]      Follow up with Loreli Slot, MD. Schedule an appointment as soon as possible for a visit in 2 weeks.   Contact information:   [address] [employer] [address] [place] [address] [phone]       Follow up with [facility] ANN, MD. Schedule an appointment as soon as possible for a visit in 1 week.   Contact information:   [address] [place] [address] [phone]  Follow up with Charlton Haws, MD. Schedule an appointment as soon as possible for a visit in 2 weeks.   Contact information:   [address]  [place] [address] [phone]         Total Time spent on discharge equals 45 minutes.  SignedJeoffrey Massed 12/24/2011 10:25 AM

## 2011-12-24 NOTE — Progress Notes (Signed)
Subjective: Patient continues to do well with some occasional word finding difficulties as his only cognitive deficits noted.  Tolerating steroid taper.    Objective: Current vital signs: BP 137/85  Pulse 80  Temp 98.3 F (36.8 C) (Axillary)  Resp 16  Ht 5\' 10"  (1.778 m)  Wt 99.4 kg (219 lb 2.2 oz)  BMI 31.44 kg/m2  SpO2 95% Vital signs in last 24 hours: Temp:  [98.1 F (36.7 C)-98.9 F (37.2 C)] 98.3 F (36.8 C) (09/16 0740) Pulse Rate:  [80-99] 80  (09/16 0740) Resp:  [16-18] 16  (09/16 0740) BP: (121-153)/(80-85) 137/85 mmHg (09/16 0740) SpO2:  [95 %-96 %] 95 % (09/16 0740)  Intake/Output from previous day: 09/15 0701 - 09/16 0700 In: 720 [P.O.:720] Out: -  Intake/Output this shift:   Nutritional status: Cardiac  Neurologic Exam: Mental Status:  Alert, oriented, thought content appropriate. No lability noted.  Speech fluent without evidence of aphasia but has significant word finding difficulites. Able to follow 3 step commands without difficulty.  Cranial Nerves:  II: Discs flat bilaterally; Visual fields grossly normal, pupils equal, round, reactive to light and accommodation  III,IV, VI: ptosis not present, extra-ocular motions intact bilaterally  V,VII: smile symmetric, facial light touch sensation normal bilaterally  VIII: hearing normal bilaterally  IX,X: gag reflex present  XI: bilateral shoulder shrug  XII: midline tongue extension  Motor:  Right : Upper extremity 5/5          Left: Upper extremity 5/5   Lower extremity 5/5       Lower extremity 5/5  Tone and bulk:normal tone throughout; no atrophy noted. Low amplitude, high frequency tremor noted in the feet bilaterally  Sensory: Pinprick and light touch intact throughout, bilaterally  Deep Tendon Reflexes: 2+ and symmetric throughout  Plantars:  Right: downgoing Left: downgoing  Cerebellar:  normal finger-to-nose and normal heel-to-shin test  Gait: normal gait and station. Transfers  independently   Lab Results: Basic Metabolic Panel:  Lab 12/22/11 1610 12/20/11 0555 12/19/11 0725 12/17/11 0851  NA 139 136 137 132*  K 3.5 3.6 3.7 3.8  CL 104 101 102 96  CO2 24 24 25 24   GLUCOSE 75 145* 101* 121*  BUN 10 8 6 9   CREATININE 0.89 0.80 0.91 0.83  CALCIUM 8.9 9.3 9.2 --  MG -- -- -- --  PHOS -- -- -- --    Liver Function Tests:  Lab 12/17/11 0851  AST 23  ALT 23  ALKPHOS 78  BILITOT 0.7  PROT 8.2  ALBUMIN 3.3*   No results found for this basename: LIPASE:5,AMYLASE:5 in the last 168 hours No results found for this basename: AMMONIA:3 in the last 168 hours  CBC:  Lab 12/22/11 0625 12/19/11 0725 12/17/11 0851  WBC 8.1 8.7 11.0*  NEUTROABS -- -- --  HGB 12.5* 13.3 13.6  HCT 38.1* 39.2 39.3  MCV 93.4 91.6 92.0  PLT 411* 537* 523*    Cardiac Enzymes: No results found for this basename: CKTOTAL:5,CKMB:5,CKMBINDEX:5,TROPONINI:5 in the last 168 hours  Lipid Panel: No results found for this basename: CHOL:5,TRIG:5,HDL:5,CHOLHDL:5,VLDL:5,LDLCALC:5 in the last 168 hours  CBG: No results found for this basename: GLUCAP:5 in the last 168 hours  Microbiology: Results for orders placed during the hospital encounter of 11/29/11  SURGICAL PCR SCREEN     Status: Normal   Collection Time   11/29/11  3:36 PM      Component Value Range Status Comment   MRSA, PCR NEGATIVE  NEGATIVE Final  Staphylococcus aureus NEGATIVE  NEGATIVE Final     Coagulation Studies: No results found for this basename: LABPROT:5,INR:5 in the last 72 hours  Imaging: No results found.  Medications:  I have reviewed the patient's current medications. Scheduled:   . aspirin EC  81 mg Oral Daily  . clonazePAM  0.5 mg Oral BID  . guaiFENesin  600 mg Oral BID  . heparin  5,000 Units Subcutaneous Q8H  . metoprolol tartrate  50 mg Oral BID  . neomycin-bacitracin-polymyxin   Topical BID  . prasugrel  10 mg Oral Daily  . predniSONE  50 mg Oral Q breakfast  . QUEtiapine  12.5 mg  Oral QHS  . rosuvastatin  10 mg Oral q1800  . sodium chloride  3 mL Intravenous Q12H  . Tamsulosin HCl  0.4 mg Oral Daily  . DISCONTD: acyclovir  10 mg/kg (Ideal) Intravenous Q8H    Assessment/Plan:  Patient Active Hospital Problem List: Confusion with non-focal neuro exam (12/17/2011)   Assessment: Tolerating Prednisone taper. Sleeping with Seroquel nightly   Plan: Agree with continued Seroquel and Prednisone taper.  Would continue taper on regimen of decreasing dose by 10 mg QOD until off.  This may be continued on an outpatient basis with Seroquel being used prn for sleep.  At discharge patient should follow up with neurology in 2-3 weeks.      LOS: 8 days   Thana Farr, MD [facility] [phone] 12/24/2011  8:29 AM

## 2011-12-24 NOTE — Telephone Encounter (Signed)
Not for now

## 2011-12-24 NOTE — Telephone Encounter (Signed)
PT'S WIFE AWARE  WILL NOT TAKE ANY MED FOR NOW TO  DIET AND EXERCISE  .Tyler Mayo

## 2011-12-24 NOTE — Telephone Encounter (Signed)
Pt stopped crestor for a week and the soreness has gone away and so now they need to know if he needs something else for cholesterol

## 2011-12-25 ENCOUNTER — Encounter: Payer: Medicare Other | Admitting: Thoracic Surgery (Cardiothoracic Vascular Surgery)

## 2012-01-07 ENCOUNTER — Other Ambulatory Visit: Payer: Self-pay | Admitting: Thoracic Surgery (Cardiothoracic Vascular Surgery)

## 2012-01-07 DIAGNOSIS — D381 Neoplasm of uncertain behavior of trachea, bronchus and lung: Secondary | ICD-10-CM

## 2012-01-08 ENCOUNTER — Encounter: Payer: Self-pay | Admitting: Thoracic Surgery (Cardiothoracic Vascular Surgery)

## 2012-01-09 DIAGNOSIS — C8590 Non-Hodgkin lymphoma, unspecified, unspecified site: Secondary | ICD-10-CM | POA: Insufficient documentation

## 2012-01-10 ENCOUNTER — Telehealth: Payer: Self-pay | Admitting: Family Medicine

## 2012-01-10 NOTE — Telephone Encounter (Signed)
Spoke with Tyler Mayo

## 2012-01-10 NOTE — Telephone Encounter (Signed)
Tyler Mayo I reviewed his records we do not need to see him for followup he has had a primary care evaluation. The next step in his evaluation should be a neurologic consult. If he has a neurologist asked him to call and be seen for followup by his neurologist if not set him up a consult

## 2012-01-10 NOTE — Telephone Encounter (Signed)
Caller: Angela/Spouse; Patient Name: Tyler Mayo; PCP: Kelle [facility]); Best Callback Phone Number: [phone]. States that patient was just recently hospitalized for Confusion and possible stroke. Was negative for stroke. Hospitalization date was 12/16/11. States that on his discharge instructions it reccommended to make an appointment with PCP. States "I'm not sure he really needs an appointment. They did every test under the sun in the hospital." Wants to know if Dr. Tawanna Cooler can review the test results and let her know if he really needs to come in to see Dr. Tawanna Cooler. OFFICE PLEASE FOLLOW UP.

## 2012-01-11 ENCOUNTER — Encounter: Payer: Self-pay | Admitting: Physician Assistant

## 2012-01-11 ENCOUNTER — Ambulatory Visit (INDEPENDENT_AMBULATORY_CARE_PROVIDER_SITE_OTHER): Payer: Medicare Other | Admitting: Physician Assistant

## 2012-01-11 VITALS — BP 122/60 | HR 87 | Ht 70.0 in | Wt 216.0 lb

## 2012-01-11 DIAGNOSIS — E785 Hyperlipidemia, unspecified: Secondary | ICD-10-CM

## 2012-01-11 DIAGNOSIS — I2510 Atherosclerotic heart disease of native coronary artery without angina pectoris: Secondary | ICD-10-CM

## 2012-01-11 NOTE — Progress Notes (Signed)
[address] [place], [place]  [address] Phone: [phone] Fax:  [phone]  Date:  01/11/2012   Name:  Tyler Mayo   DOB:  22-Aug-1940   MRN:  [id number]  PCP:  Evette Georges, MD  Primary Cardiologist:  Dr. Charlton Haws  Primary Electrophysiologist:  None    History of Present Illness: Tyler Mayo is a 71 y.o. male who returns for post hospital follow up.  He has a history of CAD, status post inferior STEMI 2/13 treated with a DES to the RCA. He had staged intervention with a DES to the mid LAD as well. He's been followed for lung nodules by Dr. [name]. He ultimately underwent wedge resection in 8/13. According to the notes, this was diagnosed as a low-grade lymphoma. He sees Dr. [name] next week in follow up.  He was admitted in September for mental status changes. He had a question of tiny acute infarcts in the left frontal lobe on MRI. He was seen by neurology. Repeat MRI was unremarkable. EEG was unremarkable. He was off of his Effient for a time because of a concern he may need lumbar puncture. This was deferred and his Effient was restarted. He was seen by neurology who placed him on steroids for suspicion of paraneoplastic disorder. RPR and HIV were non-reactive.  He had no cardiac complications during his admission.   He has seen neurology. He is off prednisone.  Mental status is about the same.  No new recommendations from neurology.  The patient denies chest pain, shortness of breath, syncope, orthopnea, PND or significant pedal edema.   Wt Readings from Last 3 Encounters:  01/11/12 216 lb (97.977 kg)  12/17/11 219 lb 2.2 oz (99.4 kg)  12/03/11 225 lb 12 oz (102.4 kg)     Past Medical History  Diagnosis Date  . Lung nodules     s/p bx with Dr. 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 CAD (coronary artery disease)     a.  8.2012 neg MV;  b.  05/12/2011 Inf STEMI - 100% RCA - 3.5x22 Resolute DES, Residual  80% LAD dzs. c. s/p planned DES to LAD 06/01/11.  Marland Kitchen Hyperlipidemia     takes Crestor daily  . History of tobacco abuse   . Joint pain   . Chronic back pain   . Bruises easily     takes Effient daily  . Enlarged prostate   . Depression with anxiety     takes Vibryd daily and Clonazepam prn; dx with PTSD and sees Dr.McKinney  . Erectile dysfunction   . Insomnia     takes an OTC sleep aide  . Carotid stenosis     dopplers 9/13:  RICA 40-59%    Current Outpatient Prescriptions  Medication Sig Dispense Refill  . aspirin EC 81 MG tablet Take 81 mg by mouth daily.      . clonazePAM (KLONOPIN) 0.5 MG tablet Take 0.5 mg by mouth 3 (three) times daily. For anxiety.      . gabapentin (NEURONTIN) 100 MG capsule Take 100 mg by mouth 3 (three) times daily.       Marland Kitchen HYDROcodone-acetaminophen (NORCO) 5-325 MG per tablet Take 1 tablet by mouth every 8 (eight) hours as needed. For pain      . metoprolol tartrate (LOPRESSOR) 25 MG tablet Take 12.5 mg by mouth 2 (two) times daily.      . prasugrel (EFFIENT) 10 MG TABS Take  10 mg by mouth daily.      . QUEtiapine (SEROQUEL) 12.5 mg TABS Take 0.5 tablets (12.5 mg total) by mouth at bedtime.  30 tablet  0  . rosuvastatin (CRESTOR) 20 MG tablet Take 10 mg by mouth at bedtime.      . Tamsulosin HCl (FLOMAX) 0.4 MG CAPS Take 1 capsule (0.4 mg total) by mouth daily.  30 capsule  0    Allergies: Allergies  Allergen Reactions  . Lipitor (Atorvastatin) Other (See Comments)    Joint and muscle pain    History  Substance Use Topics  . Smoking status: Former Smoker -- 0.5 packs/day for 25 years    Types: Cigars    Quit date: 04/10/1991  . Smokeless tobacco: Never Used  . Alcohol Use: No     ETOH free x 56yrs     ROS:  Please see the history of present illness.  Still has significant LB pain.   Joint pain no better off Crestor.   PHYSICAL EXAM: VS:  BP 122/60  Pulse 87  Ht 5\' 10"  (1.778 m)  Wt 216 lb (97.977 kg)  BMI 30.99 kg/m2 Well nourished,  well developed, in no acute distress HEENT: normal Neck: no JVD Cardiac:  normal S1, S2; RRR; no murmur Lungs:  clear to auscultation bilaterally, no wheezing, rhonchi or rales Abd: soft, nontender, no hepatomegaly Ext: no edema Skin: warm and dry Neuro:  CNs 2-12 intact, no focal abnormalities noted  EKG:  NSR, HR 87, no acute changes.       ASSESSMENT AND PLAN:  1. Coronary Artery Disease:  Stable.  Continue dual antiplatelet Rx.  He should remain on this for at least one year post infarct.  We can decide after that if he should remain on Effient.  Follow up with Dr. Charlton Haws in 3 mos.  He also saw the patient today (at patient's request).  2. Hyperlipidemia:  He stopped Crestor for joint pain but there has been no obvious improvement.  Will ask him to take Crestor 3x per week on Mon, Wed, Fri.    3. Confusion:  Continue follow up with neurology.  He also sees Psychiatry next week.  4. Lung Nodule:  He sees Dr. [name] next week.  Luna Glasgow, PA-C  12:02 PM 01/11/2012

## 2012-01-11 NOTE — Patient Instructions (Addendum)
1. Try taking the Crestor 3 times a week on Monday, Wednesday, Friday.  2.  Schedule follow up with Dr. Charlton Haws in 3 months.

## 2012-01-14 ENCOUNTER — Other Ambulatory Visit: Payer: Self-pay | Admitting: Thoracic Surgery (Cardiothoracic Vascular Surgery)

## 2012-01-15 ENCOUNTER — Ambulatory Visit
Admission: RE | Admit: 2012-01-15 | Discharge: 2012-01-15 | Disposition: A | Discharge status: Home / Self Care | Payer: Medicare Other | Source: Ambulatory Visit | Attending: Thoracic Surgery (Cardiothoracic Vascular Surgery) | Admitting: Thoracic Surgery (Cardiothoracic Vascular Surgery)

## 2012-01-15 ENCOUNTER — Ambulatory Visit (INDEPENDENT_AMBULATORY_CARE_PROVIDER_SITE_OTHER): Payer: Self-pay | Admitting: Thoracic Surgery (Cardiothoracic Vascular Surgery)

## 2012-01-15 ENCOUNTER — Encounter: Payer: Self-pay | Admitting: Thoracic Surgery (Cardiothoracic Vascular Surgery)

## 2012-01-15 VITALS — BP 101/69 | HR 84 | Resp 20 | Ht 70.0 in | Wt 226.0 lb

## 2012-01-15 DIAGNOSIS — D381 Neoplasm of uncertain behavior of trachea, bronchus and lung: Secondary | ICD-10-CM

## 2012-01-15 DIAGNOSIS — Z09 Encounter for follow-up examination after completed treatment for conditions other than malignant neoplasm: Secondary | ICD-10-CM

## 2012-01-15 DIAGNOSIS — R918 Other nonspecific abnormal finding of lung field: Secondary | ICD-10-CM

## 2012-01-15 NOTE — Progress Notes (Signed)
HPI:  Her side and returns today for her second postoperative followup visit. He had a wedge resection of a right middle lobe nodule in August. 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 He was readmitted about a week after discharge with neurologic symptoms. He's been seen by both neurology and psychiatry. 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 He has improved from a neurologic standpoint, although he is not back to normal.  He has some incisional discomfort, but it's mild. He's not had any respiratory difficulties.  Remains very anxious about his diagnosis. I briefly discussed the possibility of an oncology referral with them, and he became very agitated.  Past Medical History  Diagnosis Date  . Lung nodules     s/p bx with Dr. 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 CAD (coronary artery disease)     a.  8.2012 neg MV;  b.  05/12/2011 Inf STEMI - 100% RCA - 3.5x22 Resolute DES, Residual 80% LAD dzs. c. s/p planned DES to LAD 06/01/11.  Marland Kitchen Hyperlipidemia     takes Crestor daily  . History of tobacco abuse   . Joint pain   . Chronic back pain   . Bruises easily     takes Effient daily  . Enlarged prostate   . Depression with anxiety     takes Vibryd daily and Clonazepam prn; dx with PTSD and sees Dr.McKinney  . Erectile dysfunction   . Insomnia     takes an OTC sleep aide  . Carotid stenosis     dopplers 9/13:  RICA 40-59%      Current Outpatient Prescriptions  Medication Sig Dispense Refill  . aspirin EC 81 MG tablet Take 81 mg by mouth daily.      . clonazePAM (KLONOPIN) 0.5 MG tablet Take 0.5 mg by mouth 3 (three) times daily. For anxiety.      . gabapentin (NEURONTIN) 100 MG capsule Take 100 mg by mouth 3 (three) times daily.       Marland Kitchen HYDROcodone-acetaminophen (NORCO) 5-325 MG per tablet Take 1 tablet by mouth every 8 (eight) hours as needed. For pain      . metoprolol tartrate (LOPRESSOR) 25 MG tablet Take  12.5 mg by mouth 2 (two) times daily.      . prasugrel (EFFIENT) 10 MG TABS Take 10 mg by mouth daily.      . QUEtiapine (SEROQUEL) 12.5 mg TABS Take 0.5 tablets (12.5 mg total) by mouth at bedtime.  30 tablet  0  . rosuvastatin (CRESTOR) 20 MG tablet Take 10 mg by mouth at bedtime. TAKES ONLY ON MON, WED.,FRI'S        Physical Exam BP 101/69  Pulse 84  Resp 20  Ht 5\' 10"  (1.778 m)  Wt 226 lb (102.513 kg)  BMI 32.43 kg/m2  SpO2 95% Gen. well-developed and well-nourished, very anxious Lungs clear with equal breath sounds bilaterally Incision is well-healed Cardiac regular rate and rhythm  Diagnostic Tests: Chest x-ray shows postoperative changes no active disease  Impression: 71 year old gentleman who is now about 2 months out from wedge resection of the right middle lobe nodule that turned out to be a MALT lymphoma. 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 He remains extremely anxious about the nodule and the diagnosis. He became agitated and upset when I discussed the possibility of an  oncology referral.  As I suspect there is no indication for any systemic treatment, we will not do an oncology referral at this time.  I did strongly advise him to followup with Dr. Shelle Iron, as he will want to continue following [name] as well.  He does still have a nodule in the left side that will need to be followed  Plan: I will plan to see him back in 6 months with a CT of the chest

## 2012-01-26 ENCOUNTER — Other Ambulatory Visit: Payer: Self-pay | Admitting: Thoracic Surgery (Cardiothoracic Vascular Surgery)

## 2012-01-28 ENCOUNTER — Other Ambulatory Visit: Payer: Self-pay

## 2012-01-28 NOTE — Telephone Encounter (Signed)
RX refill call to pt's requesting pharm

## 2012-02-01 ENCOUNTER — Encounter: Payer: Self-pay | Admitting: Pulmonary Disease

## 2012-02-01 ENCOUNTER — Ambulatory Visit (INDEPENDENT_AMBULATORY_CARE_PROVIDER_SITE_OTHER): Payer: Medicare Other | Admitting: Pulmonary Disease

## 2012-02-01 VITALS — BP 122/88 | HR 91 | Temp 97.5°F | Ht 70.0 in | Wt 218.0 lb

## 2012-02-01 DIAGNOSIS — R918 Other nonspecific abnormal finding of lung field: Secondary | ICD-10-CM

## 2012-02-01 NOTE — Progress Notes (Signed)
  Subjective:    Patient ID: Tyler Mayo, male    DOB: 1941/01/13, 71 y.o.   MRN: [id number]  HPI The patient comes in today for followup after his surgery for his pulmonary nodules.  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  The patient also had an autoimmune workup that was abnormal, however has seen a rheumatologist who felt he did not have significant disease.  I will need to get that note for verification.  The patient currently is doing well from a pulmonary standpoint, with no increased shortness of breath or congestion.   Review of Systems  Constitutional: Negative for fever and unexpected weight change.  HENT: Negative for ear pain, nosebleeds, congestion, sore throat, rhinorrhea, sneezing, trouble swallowing, dental problem, postnasal drip and sinus pressure.   Eyes: Negative for redness and itching.  Respiratory: Negative for cough, chest tightness, shortness of breath and wheezing.   Cardiovascular: Negative for palpitations and leg swelling.  Gastrointestinal: Negative for nausea and vomiting.  Genitourinary: Negative for dysuria.  Musculoskeletal: Negative for joint swelling.  Skin: Negative for rash.  Neurological: Negative for headaches.  Hematological: Bruises/bleeds easily.  Psychiatric/Behavioral: Positive for dysphoric mood. The patient is nervous/anxious.        Objective:   Physical Exam Well-developed male in no acute distress Nose without purulence or discharge noted Chest totally clear to auscultation, no wheezes Cardiac exam with regular rate and rhythm Lower extremities with minimal edema, no cyanosis Alert and oriented, moves all 4 extremities.       Assessment & Plan:

## 2012-02-01 NOTE — Patient Instructions (Addendum)
Will refer you to a hematologist/oncologist to get an opinion about your lung pathology. Will get note from rheumatology to review. Will call once I receive your hematology/oncology evaluation.

## 2012-02-01 NOTE — Assessment & Plan Note (Signed)
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  He did have abnormal autoimmune labs, and was referred to rheumatology for evaluation.  I have not received their note, but the patient's wife states they did not feel he had a rheumatologic disease.  I think he needs to have an evaluation by hematology/oncology.

## 2012-02-11 ENCOUNTER — Other Ambulatory Visit: Payer: Self-pay | Admitting: *Deleted

## 2012-02-11 DIAGNOSIS — G8918 Other acute postprocedural pain: Secondary | ICD-10-CM

## 2012-02-11 MED ORDER — HYDROCODONE-ACETAMINOPHEN 5-325 MG PO TABS: 1.0000 | ORAL_TABLET | Freq: Three times a day (TID) | ORAL | Status: DC | PRN

## 2012-02-12 ENCOUNTER — Telehealth: Payer: Self-pay | Admitting: Oncology

## 2012-02-12 NOTE — Telephone Encounter (Signed)
C/D 02/12/12 for appt 03/20/12

## 2012-03-03 ENCOUNTER — Other Ambulatory Visit: Payer: Self-pay

## 2012-03-03 DIAGNOSIS — G8918 Other acute postprocedural pain: Secondary | ICD-10-CM

## 2012-03-03 MED ORDER — HYDROCODONE-ACETAMINOPHEN 5-325 MG PO TABS: 1.0000 | ORAL_TABLET | Freq: Three times a day (TID) | ORAL | Status: DC | PRN

## 2012-03-03 NOTE — Telephone Encounter (Signed)
Refill on RX Norco 5/325 mg #40/ no refills called to [facility]

## 2012-03-19 ENCOUNTER — Other Ambulatory Visit: Payer: Self-pay | Admitting: Physician Assistant

## 2012-03-19 DIAGNOSIS — C8590 Non-Hodgkin lymphoma, unspecified, unspecified site: Secondary | ICD-10-CM

## 2012-03-20 ENCOUNTER — Ambulatory Visit (HOSPITAL_BASED_OUTPATIENT_CLINIC_OR_DEPARTMENT_OTHER): Payer: Medicare Other | Admitting: Oncology

## 2012-03-20 ENCOUNTER — Other Ambulatory Visit (HOSPITAL_BASED_OUTPATIENT_CLINIC_OR_DEPARTMENT_OTHER): Payer: Medicare Other | Admitting: Lab

## 2012-03-20 ENCOUNTER — Telehealth: Payer: Self-pay | Admitting: Oncology

## 2012-03-20 ENCOUNTER — Ambulatory Visit: Payer: Medicare Other

## 2012-03-20 VITALS — BP 110/74 | HR 77 | Temp 96.9°F | Resp 20 | Ht 70.0 in | Wt 222.2 lb

## 2012-03-20 DIAGNOSIS — D72820 Lymphocytosis (symptomatic): Secondary | ICD-10-CM

## 2012-03-20 DIAGNOSIS — C8590 Non-Hodgkin lymphoma, unspecified, unspecified site: Secondary | ICD-10-CM

## 2012-03-20 LAB — CBC WITH DIFFERENTIAL/PLATELET
EOS%: 5.2 % (ref 0.0–7.0)
Eosinophils Absolute: 0.5 10*3/uL (ref 0.0–0.5)
MCV: 95.2 fL (ref 79.3–98.0)
MONO%: 9.3 % (ref 0.0–14.0)
NEUT#: 3.7 10*3/uL (ref 1.5–6.5)
RBC: 4.46 10*6/uL (ref 4.20–5.82)
RDW: 15 % — ABNORMAL HIGH (ref 11.0–14.6)

## 2012-03-20 LAB — COMPREHENSIVE METABOLIC PANEL (CC13)
ALT: 14 U/L (ref 0–55)
AST: 17 U/L (ref 5–34)
Albumin: 3.5 g/dL (ref 3.5–5.0)
Alkaline Phosphatase: 86 U/L (ref 40–150)
Glucose: 88 mg/dl (ref 70–99)
Potassium: 4 mEq/L (ref 3.5–5.1)
Sodium: 139 mEq/L (ref 136–145)
Total Protein: 8.2 g/dL (ref 6.4–8.3)

## 2012-03-20 NOTE — Telephone Encounter (Signed)
gve the pt his may 2014 appt calendar

## 2012-03-23 NOTE — Progress Notes (Signed)
ID: Tyler Mayo   DOB: May 05, 1940  MR#: [id number]  CSN#:[id number]  PCP: Evette Georges, MD SU: Andrey Spearman OTHER MD: Marcelyn Bruins, Rosita Kea   HISTORY OF PRESENT ILLNESS: [name] had a chest x-ray preop a planned L1 vertebral plasty 05/24/2009. This showed a nodular density in the medial right lower lung. CT scan of the chest was obtained the next day, and showed a 2 groundglass opacities, one in the right middle lobe peripherally, a second one in the anterior medial left upper lobe. Both of were greater than 1 cm him and felt to be worrisome for low-grade adenocarcinoma. Consultation with thoracic surgery was obtained, and on 09/11/2010 the patient had a PET scan which showed the right middle lobe nodule, measuring 69 cm, to have an S. U V. max of 3.2. The left upper lobe nodule also had increased uptake, though the SUV max of 1.5 was borderline. This smaller nodule measured 1.2 cm.  Bronchoscopy with brushings and trans-bronchial biopsy 09/22/2010 showed only benign reactive epithelium (NZA [id number] and SZA [id number]). On 11/23/2010 a cardiac CT scan incidentally showed the right lung mass to have increased to 1.8 cm. Repeat chest CT 12/27/2010 showed the now slightly spiculated right lung nodule to measure 2.2 cm. The left upper lobe nodule measured 1.4 cm and had not changed as compared to CT scan of August 2012.  On 01/05/2011 the patient underwent CT-guided biopsy of the right lung nodule, showing ([id number] [phone]) a chronic inflammatory infiltrate with an abundance of plasma cells. Clonality by kappa/lambda stains could not be demonstrated. This was felt to most likely to be a reactive process. However, repeat CT of the chest 07/25/2011 showed the groundglass opacity in the right middle lobe to have increased further, now with a cavitary center. The lingular lesion hot also increased slightly. Accordingly on 12/03/2011 the patient underwent video-assisted thoracoscopic  wedge resection of the more worrisome right middle lobe nodule. The pathology [phone] and [id number]) showed an atypical lymphoplasmacytic infiltrate which was reviewed in [place] by Dr. Cheri Fowler. Based on her careful review, it was not possible to distinguish between nodular lymphoid hyperplasia (a very and of lymphoid interstitial pneumonia), which would be benign, and a low-grade MALT lymphoma with plasmacytic differentiation. In favor of the latter was their in situ hybridization study for kappa and lambda light chains, which showed a kappa: lambda ratio of greater than 10:1 (upper limit in reactive cases being 4 :1). In favor of a "reactive" interpretation were are flow cytometry studies, which showed no monoclonal B-cell population or abnormal T cell phenotype.   The patient's subsequent history is as detailed below  INTERVAL HISTORY: Patient was seen in the hematology clinic 03/20/2012 71, accompanied by his wife Tyler Mayo.  REVIEW OF SYSTEMS: The patient was found to have some difficulties with speech, and underwent neurologic evaluation September of this year, without definitive findings. He tolerated his right VATS procedure well, but does occasionally have pain in his right arm and back, which is very intermittent. He denies current angina symptoms 71, shortness of breath, cough, phlegm production, pleurisy, fever, drenching sweats, weight loss (brother he has had some weight gain), or unexplained fatigue. He bruises easily, but of course he is on antiplatelet agents. He describes himself as forgetful, anxious and depressed. A detailed review of systems was otherwise noncontributory.  PAST MEDICAL HISTORY: Past Medical History  Diagnosis Date  . Lung nodules     s/p bx with Dr. 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 CAD (coronary artery disease)     a.  8.2012 neg MV;  b.  05/12/2011 Inf STEMI - 100% RCA - 3.5x22 Resolute DES, Residual 80% LAD  dzs. c. s/p planned DES to LAD 06/01/11.  Marland Kitchen Hyperlipidemia     takes Crestor daily  . History of tobacco abuse   . Joint pain   . Chronic back pain   . Bruises easily     takes Effient daily  . Enlarged prostate   . Depression with anxiety     takes Vibryd daily and Clonazepam prn; dx with PTSD and sees Dr.McKinney  . Erectile dysfunction   . Insomnia     takes an OTC sleep aide  . Carotid stenosis     dopplers 9/13:  RICA 40-59%    PAST SURGICAL HISTORY: Past Surgical History  Procedure Date  . Tonsillectomy   . Adenoidectomy   . Ankle fracture surgery   . Cervical disc surgery     3 level fusion with Cadaver Maurine Minister June 2009 successful.  No complications  . Fiberoptic bronchoscopy with endobronchial 09/23/2010  . L1 vertebroplasty and l1 core biopsy. 08/18/2010  . Right thigh abscess incision and drainage. 05/04/2010  . Right inguinal hernia repair with extended prolene hernia 12/24/2008  . Decompressive anterior cervical diskectomy, c5-c6 and c6-c7. 08/20/2007  . Cardiac catheterization   . Cardiac stents     2 stents   . Colonoscopy   . Cataracts removed     bilateral  . Right lung biopsy     FAMILY HISTORY Family History  Problem Relation Age of Onset  . Alcohol abuse Father   . Lung cancer Father   . Heart disease Mother    the patient's father died from lung cancer at the age of 44. He had a history of smoking. The patient's mother died at the age of 52. He was an only child.  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 This involved quite a bit of traveling. His wife of 16 years, Tyler Mayo, present today, formerly worked in an Biomedical scientist. The patient has a daughter from an earlier marriage, Tyler Mayo, who lives in [place]. [place] and worked for the [employer]. [employer] Tyler Mayo has 2 children from an earlier marriage, [name] and Maynard. The patient is a nonproductive Engineering geologist. Tyler Mayo is a Control and instrumentation engineer.   ADVANCED  DIRECTIVES:  HEALTH MAINTENANCE: History  Substance Use Topics  . Smoking status: Former Smoker -- 0.5 packs/day for 25 years    Types: Cigars    Quit date: 04/10/1991  . Smokeless tobacco: Never Used  . Alcohol Use: No     Comment: ETOH free x 6yrs     Colonoscopy:  PSA:  Bone density:  Lipid panel:  Allergies  Allergen Reactions  . Lipitor (Atorvastatin) Other (See Comments)    Joint and muscle pain    Current Outpatient Prescriptions  Medication Sig Dispense Refill  . aspirin EC 81 MG tablet Take 81 mg by mouth daily.      . clonazePAM (KLONOPIN) 0.5 MG tablet Take 0.5 mg by mouth 3 (three) times daily. For anxiety.      . gabapentin (NEURONTIN) 100 MG capsule Take 100 mg by mouth 3 (three) times daily.       Marland Kitchen HYDROcodone-acetaminophen (NORCO/VICODIN) 5-325 MG per tablet Take 1 tablet by mouth every 8 (eight) hours as needed for pain (take 1 or 2 tablets 3  times daily as needed ).  40 tablet  0  . metoprolol tartrate (LOPRESSOR) 25 MG tablet Take 12.5 mg by mouth 2 (two) times daily.      . prasugrel (EFFIENT) 10 MG TABS Take 10 mg by mouth daily.      . QUEtiapine (SEROQUEL) 12.5 mg TABS Take 0.5 tablets (12.5 mg total) by mouth at bedtime.  30 tablet  0  . rosuvastatin (CRESTOR) 20 MG tablet Take 10 mg by mouth at bedtime. TAKES ONLY ON MON, WED.,FRI'S        OBJECTIVE: Elderly white male in no acute distress Filed Vitals:   03/20/12 1328  BP: 110/74  Pulse: 77  Temp: 96.9 F (36.1 C)  Resp: 20     Body mass index is 31.88 kg/(m^2).    ECOG FS: 1  Sclerae unicteric Oropharynx clear No cervical or supraclavicular adenopathy; no axillary or inguinal adenopathy Lungs no rales or rhonchi; VATS scars nicely healed Heart regular rate and rhythm Abd benign MSK no peripheral edema Neuro: nonfocal; speech is her medical but slow with occasional difficulty in word finding; oriented x3 Skin: no rash   LAB RESULTS:  SPEP February 2012 showed an IgG of 2120, IgA  512, and IgM 363, all nonspecifically elevated. Kappa light chains were 3.09, lambda of 4.06, with a normal ratio at 0.76.   Lab Results  Component Value Date   WBC 8.9 03/20/2012   NEUTROABS 3.7 03/20/2012   HGB 14.7 03/20/2012   HCT 42.5 03/20/2012   MCV 95.2 03/20/2012   PLT 241 03/20/2012      Chemistry      Component Value Date/Time   NA 139 03/20/2012 1309   NA 140 12/24/2011 0645   K 4.0 03/20/2012 1309   K 3.0* 12/24/2011 0645   CL 104 03/20/2012 1309   CL 103 12/24/2011 0645   CO2 28 03/20/2012 1309   CO2 28 12/24/2011 0645   BUN 16.0 03/20/2012 1309   BUN 12 12/24/2011 0645   CREATININE 1.2 03/20/2012 1309   CREATININE 0.96 12/24/2011 0645      Component Value Date/Time   CALCIUM 9.0 03/20/2012 1309   CALCIUM 9.2 12/24/2011 0645   ALKPHOS 86 03/20/2012 1309   ALKPHOS 78 12/17/2011 0851   AST 17 03/20/2012 1309   AST 23 12/17/2011 0851   ALT 14 03/20/2012 1309   ALT 23 12/17/2011 0851   BILITOT 0.82 03/20/2012 1309   BILITOT 0.7 12/17/2011 0851       No results found for this basename: LABCA2    No components found with this basename: LABCA125    No results found for this basename: INR:1;PROTIME:1 in the last 168 hours  Urinalysis    Component Value Date/Time   COLORURINE YELLOW 12/17/2011 0233   APPEARANCEUR CLEAR 12/17/2011 0233   LABSPEC 1.022 12/17/2011 0233   PHURINE 5.5 12/17/2011 0233   GLUCOSEU NEGATIVE 12/17/2011 0233   HGBUR NEGATIVE 12/17/2011 0233   HGBUR negative 04/13/2009 1008   BILIRUBINUR NEGATIVE 12/17/2011 0233   KETONESUR NEGATIVE 12/17/2011 0233   PROTEINUR NEGATIVE 12/17/2011 0233   UROBILINOGEN 0.2 12/17/2011 0233   NITRITE NEGATIVE 12/17/2011 0233   LEUKOCYTESUR NEGATIVE 12/17/2011 0233    STUDIES: No results found.  ASSESSMENT: 71 y.o. status post VATS wedge resection of a middle lobe lesion 12/03/2011, the differential diagnosis including benign nodular lymphoid hyperplasia/ lymphoid interstitial pneumonia, and a low-grade MALT lymphoma  PLAN: I  spent well over an hour with Mr. Minahan and his wife  going over the nature of "white cell cancers", how they are evaluated, and there of variety. 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 We discussed both possibilities. Specifically, if he has a reactive process, this requires only observation, and should be self-limiting.  If he has a low-grade non-Hodgkin's lymphoma, indications for treatment include anemia, thrombocytopenia, unexplained severe fatigue, unexplained loss of 10% of body weight, unexplained fevers or drenching sweats, or significant adenopathy. He has none of these indications, so even if we knew for a fact that he had a MALT lymphoma, we would likely only observe. He understands we have many treatments for this type of lymphoma, and they are generally well-tolerated, it's just at they would make no difference to his survival or well-being at this point.  This was very reassuring to the patient. He will see me again in 6 months, with lab work, and a chest x-ray prior to that is. Certainly if any "B." symptoms or other  concerns develop before the next visit, he will let us know.      MAGRINAT,GUSTAV C    03/23/2012

## 2012-04-18 ENCOUNTER — Ambulatory Visit (INDEPENDENT_AMBULATORY_CARE_PROVIDER_SITE_OTHER): Payer: Medicare Other | Admitting: Cardiovascular Disease

## 2012-04-18 ENCOUNTER — Encounter: Payer: Self-pay | Admitting: Cardiovascular Disease

## 2012-04-18 VITALS — BP 127/80 | HR 93 | Wt 226.0 lb

## 2012-04-18 DIAGNOSIS — R410 Disorientation, unspecified: Secondary | ICD-10-CM

## 2012-04-18 DIAGNOSIS — F29 Unspecified psychosis not due to a substance or known physiological condition: Secondary | ICD-10-CM

## 2012-04-18 DIAGNOSIS — E785 Hyperlipidemia, unspecified: Secondary | ICD-10-CM

## 2012-04-18 DIAGNOSIS — C8590 Non-Hodgkin lymphoma, unspecified, unspecified site: Secondary | ICD-10-CM

## 2012-04-18 DIAGNOSIS — I2510 Atherosclerotic heart disease of native coronary artery without angina pectoris: Secondary | ICD-10-CM

## 2012-04-18 DIAGNOSIS — C8589 Other specified types of non-Hodgkin lymphoma, extranodal and solid organ sites: Secondary | ICD-10-CM

## 2012-04-18 NOTE — Patient Instructions (Addendum)
**Note De-identified  Obfuscation** Your physician recommends that you continue on your current medications as directed. Please refer to the Current Medication list given to you today.  Your physician recommends that you schedule a follow-up appointment in: 6 months  

## 2012-04-18 NOTE — Assessment & Plan Note (Signed)
S/P right lung wedge resection F/U Dr [name] Appears to be a more benign prognosis

## 2012-04-18 NOTE — Assessment & Plan Note (Signed)
He clearly has some personality disorder and dementia  F/U neuro and primary  Stable

## 2012-04-18 NOTE — Assessment & Plan Note (Signed)
Stable with no angina and good activity level.  Continue medical Rx  

## 2012-04-18 NOTE — Assessment & Plan Note (Signed)
Cholesterol is at goal.  Continue current dose of statin and diet Rx.  No myalgias or side effects.  F/U  LFT's in 6 months. Lab Results  Component Value Date   LDLCALC 68 08/16/2011

## 2012-04-18 NOTE — Progress Notes (Signed)
Patient ID: Tyler Mayo, male   DOB: [birth year], 72 y.o.   MRN: [id number] Tyler Mayo is a 72 y.o. male who returns for post hospital follow up.  He has a history of CAD, status post inferior STEMI 2/13 treated with a DES to the RCA. He had staged intervention with a DES to the mid LAD as well. He's been followed for lung nodules by Dr. [name]. He ultimately underwent wedge resection in 8/13. According to the notes, this was diagnosed as a low-grade lymphoma. He sees Dr. [name] next week in follow up.  He was admitted in September for mental status changes. He had a question of tiny acute infarcts in the left frontal lobe on MRI. He was seen by neurology. Repeat MRI was unremarkable. EEG was unremarkable. He was off of his Effient for a time because of a concern he may need lumbar puncture. This was deferred and his Effient was restarted. He was seen by neurology who placed him on steroids for suspicion of paraneoplastic disorder. RPR and HIV were non-reactive. He had no cardiac complications during his admission.  He has seen neurology. He is off prednisone. Mental status is about the same. No new recommendations from neurology. The patient denies chest pain, shortness of breath, syncope, orthopnea, PND or significant pedal edema.   Dr [name] alayed his fears about either reactive lung disease or low grade lymphoma which should do well  Asking for for oxycodone for pain which he has received from Dr Prince Rome before   ROS: Denies fever, malais, weight loss, blurry vision, decreased visual acuity, cough, sputum, SOB, hemoptysis, pleuritic pain, palpitaitons, heartburn, abdominal pain, melena, lower extremity edema, claudication, or rash.  All other systems reviewed and negative  General: Affect appropriate Healthy:  appears stated age HEENT: normal Neck supple with no adenopathy JVP normal no bruits no thyromegaly Lungs clear with no wheezing and good diaphragmatic motion Heart:   S1/S2 no murmur, no rub, gallop or click PMI normal Abdomen: benighn, BS positve, no tenderness, no AAA no bruit.  No HSM or HJR Distal pulses intact with no bruits No edema Neuro non-focal Skin warm and dry No muscular weakness   Current Outpatient Prescriptions  Medication Sig Dispense Refill  . aspirin EC 81 MG tablet Take 81 mg by mouth daily.      . clonazePAM (KLONOPIN) 0.5 MG tablet Take 0.5 mg by mouth 3 (three) times daily. For anxiety.      . gabapentin (NEURONTIN) 100 MG capsule Take 100 mg by mouth 3 (three) times daily.       Marland Kitchen HYDROcodone-acetaminophen (NORCO/VICODIN) 5-325 MG per tablet Take 1 tablet by mouth every 8 (eight) hours as needed for pain (take 1 or 2 tablets 3 times daily as needed ).  40 tablet  0  . metoprolol tartrate (LOPRESSOR) 25 MG tablet Take 12.5 mg by mouth 2 (two) times daily.      . prasugrel (EFFIENT) 10 MG TABS Take 10 mg by mouth daily.      . QUEtiapine (SEROQUEL) 12.5 mg TABS Take 0.5 tablets (12.5 mg total) by mouth at bedtime.  30 tablet  0    Allergies  Lipitor  Electrocardiogram:  01/11/12 SR rate 87 normal  Assessment and Plan

## 2012-06-25 ENCOUNTER — Other Ambulatory Visit: Payer: Self-pay | Admitting: *Deleted

## 2012-06-25 MED ORDER — PRASUGREL HCL 10 MG PO TABS: 10.0000 mg | ORAL_TABLET | Freq: Every day | ORAL | Status: DC

## 2012-07-01 ENCOUNTER — Other Ambulatory Visit: Payer: Self-pay | Admitting: *Deleted

## 2012-07-01 DIAGNOSIS — R918 Other nonspecific abnormal finding of lung field: Secondary | ICD-10-CM

## 2012-07-29 ENCOUNTER — Ambulatory Visit: Payer: Medicare Other | Admitting: Thoracic Surgery (Cardiothoracic Vascular Surgery)

## 2012-07-29 ENCOUNTER — Ambulatory Visit
Admission: RE | Admit: 2012-07-29 | Discharge: 2012-07-29 | Disposition: A | Discharge status: Home / Self Care | Payer: Medicare Other | Source: Ambulatory Visit | Attending: Thoracic Surgery (Cardiothoracic Vascular Surgery) | Admitting: Thoracic Surgery (Cardiothoracic Vascular Surgery)

## 2012-07-29 ENCOUNTER — Encounter: Payer: Self-pay | Admitting: Thoracic Surgery (Cardiothoracic Vascular Surgery)

## 2012-07-29 ENCOUNTER — Ambulatory Visit (INDEPENDENT_AMBULATORY_CARE_PROVIDER_SITE_OTHER): Payer: Medicare Other | Admitting: Thoracic Surgery (Cardiothoracic Vascular Surgery)

## 2012-07-29 VITALS — BP 112/74 | HR 117 | Resp 20 | Ht 70.0 in | Wt 226.0 lb

## 2012-07-29 DIAGNOSIS — R918 Other nonspecific abnormal finding of lung field: Secondary | ICD-10-CM

## 2012-07-29 DIAGNOSIS — Z09 Encounter for follow-up examination after completed treatment for conditions other than malignant neoplasm: Secondary | ICD-10-CM

## 2012-07-29 DIAGNOSIS — D381 Neoplasm of uncertain behavior of trachea, bronchus and lung: Secondary | ICD-10-CM

## 2012-07-29 MED ORDER — IOHEXOL 300 MG/ML  SOLN
75.0000 mL | Freq: Once | INTRAMUSCULAR | Status: AC | PRN
  Administered 2012-07-29: 75 mL via INTRAVENOUS

## 2012-07-29 NOTE — Progress Notes (Signed)
HPI:  Mr. Geno is a 72 year old gentleman had a wedge resection of a right middle lobe mass back in the fall. The mass turned out to be likely a low-grade MALT lymphoma. His initial postoperative course was uncomplicated. However, shortly after discharge he was readmitted with some unusual neurologic complaints. 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 He does still have some issues with depression and memory loss.  He had been followed for bilateral lung nodules for quite some time. The one in the right middle lobe was biopsied as it was larger. He does still have one in the left upper lobe that needs continued followup.  He says that he's been doing well since his last visit with me. He did see Dr. Eden Emms and Dr. [name] in the meantime. Dr. [name] did not recommend any systemic therapy for the low-grade MALT lymphoma.  He says that he is gained about 20 pounds. He attributes this to eating more and exercising loss. He otherwise has no new complaints.  Past Medical History  Diagnosis Date  . Lung nodules     s/p bx with Dr. 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 CAD (coronary artery disease)     a.  8.2012 neg MV;  b.  05/12/2011 Inf STEMI - 100% RCA - 3.5x22 Resolute DES, Residual 80% LAD dzs. c. s/p planned DES to LAD 06/01/11.  Marland Kitchen Hyperlipidemia     takes Crestor daily  . History of tobacco abuse   . Joint pain   . Chronic back pain   . Bruises easily     takes Effient daily  . Enlarged prostate   . Depression with anxiety     takes Vibryd daily and Clonazepam prn; dx with PTSD and sees Dr.McKinney  . Erectile dysfunction   . Insomnia     takes an OTC sleep aide  . Carotid stenosis     dopplers 9/13:  RICA 40-59%       Current Outpatient Prescriptions  Medication Sig Dispense Refill  . aspirin EC 81 MG tablet Take 81 mg by mouth daily.      . clonazePAM (KLONOPIN) 0.5 MG tablet Take 0.5 mg by mouth 3 (three) times daily. For  anxiety.      . gabapentin (NEURONTIN) 100 MG capsule Take 100 mg by mouth 3 (three) times daily.       Marland Kitchen HYDROcodone-acetaminophen (NORCO/VICODIN) 5-325 MG per tablet Take 1 tablet by mouth every 8 (eight) hours as needed for pain (take 1 or 2 tablets 3 times daily as needed ).  40 tablet  0  . metoprolol tartrate (LOPRESSOR) 25 MG tablet Take 12.5 mg by mouth 2 (two) times daily.      . prasugrel (EFFIENT) 10 MG TABS Take 1 tablet (10 mg total) by mouth daily.  30 tablet  12  . QUEtiapine (SEROQUEL) 12.5 mg TABS Take 0.5 tablets (12.5 mg total) by mouth at bedtime.  30 tablet  0   No current facility-administered medications for this visit.    Physical Exam BP 112/74  Pulse 117  Resp 20  Ht 5\' 10"  (1.778 m)  Wt 226 lb (102.513 kg)  BMI 32.43 kg/m2  SpO66 15% 72 year old male in no acute distress Alert and oriented x3 with no focal deficits Lungs clear with equal breath sounds bilaterally No palpable cervical or supraclavicular adenopathy  Diagnostic Tests: CT of chest 07/29/2012 *RADIOLOGY REPORT*  Clinical Data: Follow-up of lung nodules.  CT  CHEST WITH CONTRAST  Technique: Multidetector CT imaging of the chest was performed  following the standard protocol during bolus administration of  intravenous contrast.  Contrast: 75mL OMNIPAQUE IOHEXOL 300 MG/ML SOLN  Comparison: 11/13/2011  Findings: Heart is upper limits normal in size. Coronary artery  calcifications in the left anterior descending artery and right  coronary artery. Aorta is normal caliber. No mediastinal, hilar,  or axillary adenopathy.  Postsurgical changes in the right middle lobe with resection of  previously seen cavitary nodule. Linear areas of scarring now  present in the right middle lobe. No new or suspicious pulmonary  nodules on the right.  On the left, the ground-glass attenuation nodule in the anterior  left upper lobe is again noted. This measures 14 x 12 mm compared  to 17 x 12 mm previously.  Again, some retraction of the overlying  medial pleural noted.  Previous area of bronchiectasis and architectural distortion in the  anterolateral right lower lobe is no longer visualized. No new  pulmonary nodules. No pleural effusions. Visualized thyroid and  chest wall soft tissues unremarkable. Imaging into the upper  abdomen shows no acute findings.  IMPRESSION:  Interval resection of the previously seen right middle lobe nodule.  Expected postoperative changes.  Stable or slight decreased size of the anterior left upper lobe  nodule.  No new nodules.  Original Report Authenticated By: Charlett Nose, M.D.  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 This has not required any further therapy. He still has a nodule in the left upper lobe. This is been stable and has remained stable over the past 6 months. There is no indication for intervention at this time but does need to continue to be followed.   Plan: I will plan to see him back in 6 months with a repeat CT of the chest

## 2012-08-13 ENCOUNTER — Other Ambulatory Visit: Payer: Self-pay | Admitting: *Deleted

## 2012-08-13 MED ORDER — METOPROLOL TARTRATE 25 MG PO TABS: 12.5000 mg | ORAL_TABLET | Freq: Two times a day (BID) | ORAL | Status: DC

## 2012-08-25 ENCOUNTER — Telehealth: Payer: Self-pay | Admitting: Oncology

## 2012-08-25 ENCOUNTER — Other Ambulatory Visit: Payer: Self-pay | Admitting: Oncology

## 2012-08-25 DIAGNOSIS — D72820 Lymphocytosis (symptomatic): Secondary | ICD-10-CM

## 2012-08-25 NOTE — Telephone Encounter (Signed)
Per 5/19 pof cx'd 5/27 lb only pt to keep f/u on 5/27. Added lb for 5/22 per 5/19 pof and pt to have cxr after lb on 5/22. lmonvm for pt re changes and new appts for lb/cxr 5/22 and confirmed f/u only appt @ 2:30pm 5/27.

## 2012-08-28 ENCOUNTER — Other Ambulatory Visit (HOSPITAL_BASED_OUTPATIENT_CLINIC_OR_DEPARTMENT_OTHER): Payer: Medicare Other | Admitting: Lab

## 2012-08-28 DIAGNOSIS — D72820 Lymphocytosis (symptomatic): Secondary | ICD-10-CM

## 2012-08-28 LAB — COMPREHENSIVE METABOLIC PANEL (CC13)
ALT: 26 U/L (ref 0–55)
Albumin: 3.8 g/dL (ref 3.5–5.0)
CO2: 22 mEq/L (ref 22–29)
Potassium: 3.8 mEq/L (ref 3.5–5.1)
Sodium: 141 mEq/L (ref 136–145)
Total Bilirubin: 0.57 mg/dL (ref 0.20–1.20)
Total Protein: 8.7 g/dL — ABNORMAL HIGH (ref 6.4–8.3)

## 2012-08-28 LAB — CBC WITH DIFFERENTIAL/PLATELET
BASO%: 0.9 % (ref 0.0–2.0)
LYMPH%: 46.8 % (ref 14.0–49.0)
MCHC: 33.7 g/dL (ref 32.0–36.0)
MONO#: 0.7 10*3/uL (ref 0.1–0.9)
NEUT#: 3.2 10*3/uL (ref 1.5–6.5)
RBC: 4.76 10*6/uL (ref 4.20–5.82)
RDW: 14.5 % (ref 11.0–14.6)
WBC: 8.6 10*3/uL (ref 4.0–10.3)
lymph#: 4 10*3/uL — ABNORMAL HIGH (ref 0.9–3.3)

## 2012-08-28 LAB — LACTATE DEHYDROGENASE (CC13): LDH: 146 U/L (ref 125–245)

## 2012-08-29 LAB — BETA 2 MICROGLOBULIN, SERUM: Beta-2 Microglobulin: 2.68 mg/L — ABNORMAL HIGH (ref 1.01–1.73)

## 2012-09-02 ENCOUNTER — Ambulatory Visit (HOSPITAL_BASED_OUTPATIENT_CLINIC_OR_DEPARTMENT_OTHER): Payer: Medicare Other | Admitting: Oncology

## 2012-09-02 ENCOUNTER — Telehealth: Payer: Self-pay | Admitting: *Deleted

## 2012-09-02 ENCOUNTER — Other Ambulatory Visit: Payer: Medicare Other | Admitting: Lab

## 2012-09-02 DIAGNOSIS — D381 Neoplasm of uncertain behavior of trachea, bronchus and lung: Secondary | ICD-10-CM

## 2012-09-02 NOTE — Telephone Encounter (Signed)
Received call from patient stating he has a stomach bug and would like to cancel his appt. And reschedule to tomorrow or Friday.  Per Dr. [name] schedule with Bernell List for tomorrow 09/03/12 at 2pm. Pt. Aware of appt.

## 2012-09-02 NOTE — Progress Notes (Signed)
ID: Tyler Mayo   DOB: 1940-09-26  MR#: [id number]  CSN#:[id number]  PCP: Evette Georges, MD SU: Tyler Mayo OTHER MD: Tyler Mayo, Tyler Mayo   HISTORY OF PRESENT ILLNESS: [name] had a chest x-ray preop a planned L1 vertebral plasty 05/24/2009. This showed a nodular density in the medial right lower lung. CT scan of the chest was obtained the next day, and showed a 2 groundglass opacities, one in the right middle lobe peripherally, a second one in the anterior medial left upper lobe. Both of were greater than 1 cm him and felt to be worrisome for low-grade adenocarcinoma. Consultation with thoracic surgery was obtained, and on 09/11/2010 the patient had a PET scan which showed the right middle lobe nodule, measuring 1.5 cm, to have an S. U V. max of 3.2. The left upper lobe nodule also had increased uptake, though the SUV max of 1.5 was borderline. This smaller nodule measured 1.2 cm.  Bronchoscopy with brushings and trans-bronchial biopsy 09/22/2010 showed only benign reactive epithelium (NZA [id number]`328 and SZA 03-3041). On 11/23/2010 a cardiac CT scan incidentally showed the right lung mass to have increased to 1.8 cm. Repeat chest CT 12/27/2010 showed the now slightly spiculated right lung nodule to measure 2.2 cm. The left upper lobe nodule measured 1.4 cm and had not changed as compared to CT scan of August 2012.  On 01/05/2011 the patient underwent CT-guided biopsy of the right lung nodule, showing (SZA [phone]) a chronic inflammatory infiltrate with an abundance of plasma cells. Clonality by kappa/lambda stains could not be demonstrated. This was felt to most likely to be a reactive process. However, repeat CT of the chest 07/25/2011 showed the groundglass opacity in the right middle lobe to have increased further, now with a cavitary center. The lingular lesion hot also increased slightly. Accordingly on 12/03/2011 the patient underwent video-assisted thoracoscopic  wedge resection of the more worrisome right middle lobe nodule. The pathology [id number] and [id number]) showed an atypical lymphoplasmacytic infiltrate which was reviewed in [place] by Dr. Cheri Mayo. Based on her careful review, it was not possible to distinguish between nodular lymphoid hyperplasia (a very and of lymphoid interstitial pneumonia), which would be benign, and a low-grade MALT lymphoma with plasmacytic differentiation. In favor of the latter was their in situ hybridization study for kappa and lambda light chains, which showed a kappa: lambda ratio of greater than 10:1 (upper limit in reactive cases being 4 :1). In favor of a "reactive" interpretation were are flow cytometry studies, which showed no monoclonal B-cell population or abnormal T cell phenotype.   The patient's subsequent history is as detailed below  INTERVAL HISTORY: Patient was seen in the hematology clinic 03/20/2012, accompanied by his wife Tyler Mayo.  REVIEW OF SYSTEMS: The patient was found to have some difficulties with speech, and underwent neurologic evaluation September of this year, without definitive findings. He tolerated his right VATS procedure well, but does occasionally have pain in his right arm and back, which is very intermittent. He denies current angina symptoms, shortness of breath, cough, phlegm production, pleurisy, fever, drenching sweats, weight loss (brother he has had some weight gain), or unexplained fatigue. He bruises easily, but of course he is on antiplatelet agents. He describes himself as forgetful, anxious and depressed. A detailed review of systems was otherwise noncontributory.  PAST MEDICAL HISTORY: Past Medical History  Diagnosis Date  . Lung nodules     s/p bx with Dr. 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 CAD (coronary artery disease)     a.  8.2012 neg MV;  b.  05/12/2011 Inf STEMI - 100% RCA - 3.5x22 Resolute DES, Residual 80% LAD  dzs. c. s/p planned DES to LAD 06/01/11.  Marland Kitchen Hyperlipidemia     takes Crestor daily  . History of tobacco abuse   . Joint pain   . Chronic back pain   . Bruises easily     takes Effient daily  . Enlarged prostate   . Depression with anxiety     takes Vibryd daily and Clonazepam prn; dx with PTSD and sees Tyler Mayo  . Erectile dysfunction   . Insomnia     takes an OTC sleep aide  . Carotid stenosis     dopplers 9/13:  RICA 40-59%    PAST SURGICAL HISTORY: Past Surgical History  Procedure Laterality Date  . Tonsillectomy    . Adenoidectomy    . Ankle fracture surgery    . Cervical disc surgery      3 level fusion with Cadaver Tyler Mayo June 2009 successful.  No complications  . Fiberoptic bronchoscopy with endobronchial  09/23/2010  . L1 vertebroplasty and l1 core biopsy.  08/18/2010  . Right thigh abscess incision and drainage.  05/04/2010  . Right inguinal hernia repair with extended prolene hernia  12/24/2008  . Decompressive anterior cervical diskectomy, c5-c6 and c6-c7.  08/20/2007  . Cardiac catheterization    . Cardiac stents      2 stents   . Colonoscopy    . Cataracts removed      bilateral  . Right lung biopsy      FAMILY HISTORY Family History  Problem Relation Age of Onset  . Alcohol abuse Father   . Lung cancer Father   . Heart disease Mother    the patient's father died from lung cancer at the age of 97. He had a history of smoking. The patient's mother died at the age of 74. He was an only child.  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 This involved quite a bit of traveling. His wife of 16 years, Tyler Mayo, present today, formerly worked in an Biomedical scientist. The patient has a daughter from an earlier marriage, Tyler Mayo, who lives in [place]. [place] and worked for the [employer]. [employer] Tyler Mayo has 2 children from an earlier marriage, Tyler Mayo and Tyler Mayo. The patient is a nonproductive Engineering geologist. Tyler Mayo is a  Control and instrumentation engineer.   ADVANCED DIRECTIVES:  HEALTH MAINTENANCE: History  Substance Use Topics  . Smoking status: Former Smoker -- 0.50 packs/day for 25 years    Types: Cigars    Quit date: 04/10/1991  . Smokeless tobacco: Never Used  . Alcohol Use: No     Comment: ETOH free x 10yrs     Colonoscopy:  PSA:  Bone density:  Lipid panel:  Allergies  Allergen Reactions  . Lipitor (Atorvastatin) Other (See Comments)    Joint and muscle pain    Current Outpatient Prescriptions  Medication Sig Dispense Refill  . aspirin EC 81 MG tablet Take 81 mg by mouth daily.      . clonazePAM (KLONOPIN) 0.5 MG tablet Take 0.5 mg by mouth 3 (three) times daily. For anxiety.      . gabapentin (NEURONTIN) 100 MG capsule Take 100 mg by mouth 3 (three) times daily.       Marland Kitchen HYDROcodone-acetaminophen (NORCO/VICODIN) 5-325 MG per tablet Take 1 tablet by  mouth every 8 (eight) hours as needed for pain (take 1 or 2 tablets 3 times daily as needed ).  40 tablet  0  . metoprolol tartrate (LOPRESSOR) 25 MG tablet Take 0.5 tablets (12.5 mg total) by mouth 2 (two) times daily.  30 tablet  4  . prasugrel (EFFIENT) 10 MG TABS Take 1 tablet (10 mg total) by mouth daily.  30 tablet  12  . QUEtiapine (SEROQUEL) 12.5 mg TABS Take 0.5 tablets (12.5 mg total) by mouth at bedtime.  30 tablet  0   No current facility-administered medications for this visit.    OBJECTIVE: Elderly white male in no acute distress There were no vitals filed for this visit.   There is no weight on file to calculate BMI.    ECOG FS: 1  Sclerae unicteric Oropharynx clear No cervical or supraclavicular adenopathy; no axillary or inguinal adenopathy Lungs no rales or rhonchi; VATS scars nicely healed Heart regular rate and rhythm Abd benign MSK no peripheral edema Neuro: nonfocal; speech is her medical but slow with occasional difficulty in word finding; oriented x3 Skin: no rash   LAB RESULTS:  SPEP February 2012 showed an IgG of 2120, IgA 512,  and IgM 363, all nonspecifically elevated. Kappa light chains were 3.09, lambda of 4.06, with a normal ratio at 0.76.   Lab Results  Component Value Date   WBC 8.6 08/28/2012   NEUTROABS 3.2 08/28/2012   HGB 15.2 08/28/2012   HCT 45.2 08/28/2012   MCV 94.9 08/28/2012   PLT 265 08/28/2012      Chemistry      Component Value Date/Time   NA 141 08/28/2012 1138   NA 140 12/24/2011 0645   K 3.8 08/28/2012 1138   K 3.0* 12/24/2011 0645   CL 106 08/28/2012 1138   CL 103 12/24/2011 0645   CO2 22 08/28/2012 1138   CO2 28 12/24/2011 0645   BUN 13.1 08/28/2012 1138   BUN 12 07/29/2012 0219   CREATININE 1.3 08/28/2012 1138   CREATININE 1.30 07/29/2012 0219   CREATININE 0.96 12/24/2011 0645      Component Value Date/Time   CALCIUM 9.4 08/28/2012 1138   CALCIUM 9.2 12/24/2011 0645   ALKPHOS 102 08/28/2012 1138   ALKPHOS 78 12/17/2011 0851   AST 22 08/28/2012 1138   AST 23 12/17/2011 0851   ALT 26 08/28/2012 1138   ALT 23 12/17/2011 0851   BILITOT 0.57 08/28/2012 1138   BILITOT 0.7 12/17/2011 0851       No results found for this basename: LABCA2    No components found with this basename: LABCA125    No results found for this basename: INR,  in the last 168 hours  Urinalysis    Component Value Date/Time   COLORURINE YELLOW 12/17/2011 0233   APPEARANCEUR CLEAR 12/17/2011 0233   LABSPEC 1.022 12/17/2011 0233   PHURINE 5.5 12/17/2011 0233   GLUCOSEU NEGATIVE 12/17/2011 0233   HGBUR NEGATIVE 12/17/2011 0233   HGBUR negative 04/13/2009 1008   BILIRUBINUR NEGATIVE 12/17/2011 0233   KETONESUR NEGATIVE 12/17/2011 0233   PROTEINUR NEGATIVE 12/17/2011 0233   UROBILINOGEN 0.2 12/17/2011 0233   NITRITE NEGATIVE 12/17/2011 0233   LEUKOCYTESUR NEGATIVE 12/17/2011 0233    STUDIES: No results found.  ASSESSMENT: 72 y.o. status post VATS wedge resection of a middle lobe lesion 12/03/2011, the differential diagnosis including benign nodular lymphoid hyperplasia/ lymphoid interstitial pneumonia, and a low-grade MALT lymphoma  PLAN: I  spent well over an hour with Mr.  Luzier and his wife going over the nature of "white cell cancers", how they are evaluated, and there of variety. 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 We discussed both possibilities. Specifically, if he has a reactive process, this requires only observation, and should be self-limiting.  If he has a low-grade non-Hodgkin's lymphoma, indications for treatment include anemia, thrombocytopenia, unexplained severe fatigue, unexplained loss of 10% of body weight, unexplained fevers or drenching sweats, or significant adenopathy. He has none of these indications, so even if we knew for a fact that he had a MALT lymphoma, we would likely only observe. He understands we have many treatments for this type of lymphoma, and they are generally well-tolerated, it's just at they would make no difference to his survival or well-being at this point.  This was very reassuring to the patient. He will see me again in 6 months, with lab work, and a chest x-ray prior to that is. Certainly if any "B." symptoms or other  concerns develop before the next visit, he will let us know.      MAGRINAT,GUSTAV C    09/02/2012

## 2012-09-03 ENCOUNTER — Ambulatory Visit: Payer: Medicare Other | Admitting: Family

## 2012-09-03 ENCOUNTER — Telehealth: Payer: Self-pay | Admitting: *Deleted

## 2012-09-03 NOTE — Telephone Encounter (Signed)
Received call from patient stating he still wasn't feeling up to coming in and wants to reschedule to tomorrow.  Confirmed appt. For 09/04/12 at 2pm with Bernell List.

## 2012-09-04 ENCOUNTER — Ambulatory Visit: Payer: Medicare Other | Admitting: Family

## 2012-09-04 ENCOUNTER — Telehealth: Payer: Self-pay | Admitting: *Deleted

## 2012-09-04 NOTE — Telephone Encounter (Signed)
Received call from patient stating he was feeling better but would rather come in next week.  Confirmed appt. For 09/09/12 at 230.

## 2012-09-09 ENCOUNTER — Telehealth: Payer: Self-pay | Admitting: Oncology

## 2012-09-09 ENCOUNTER — Ambulatory Visit (HOSPITAL_BASED_OUTPATIENT_CLINIC_OR_DEPARTMENT_OTHER): Payer: Medicare Other | Admitting: Family

## 2012-09-09 ENCOUNTER — Encounter: Payer: Self-pay | Admitting: Oncology

## 2012-09-09 VITALS — BP 125/85 | HR 85 | Temp 97.0°F | Resp 20 | Ht 70.0 in | Wt 230.5 lb

## 2012-09-09 DIAGNOSIS — C8590 Non-Hodgkin lymphoma, unspecified, unspecified site: Secondary | ICD-10-CM

## 2012-09-09 DIAGNOSIS — J984 Other disorders of lung: Secondary | ICD-10-CM

## 2012-09-09 NOTE — Patient Instructions (Addendum)
Please contact us at [phone] if you have any questions or concerns.  Please continue to do well and enjoy life!!!  Get plenty of rest, drink plenty of water, exercise daily (walk daily), eat a balanced diet.  Please notify us immediately for any fever, shortness of breath or any unusual symptoms.

## 2012-09-09 NOTE — Telephone Encounter (Signed)
, °

## 2012-09-09 NOTE — Progress Notes (Signed)
ID: Page Spiro   DOB: May 10, 1940  MR#: [id number]  CSN#:[id number]  PCP: Evette Georges, MD SU: Andrey Spearman, MD OTHER MD: Marcelyn Bruins, MD,  Ihor Gully, MD, Charlton Haws, MD   HISTORY OF PRESENT ILLNESS: [name] had a chest x-ray preop a planned L1 vertebral plasty 05/24/2009. This showed a nodular density in the medial right lower lung. CT scan of the chest was obtained the next day, and showed a 2 ground glass opacities, one in the right middle lobe peripherally, a second one in the anterior medial left upper lobe. Both of were greater than 1 cm him and felt to be worrisome for low-grade adenocarcinoma. Consultation with thoracic surgery was obtained, and on 09/11/2010 the patient had a PET scan which showed the right middle lobe nodule, measuring 1.5 cm, to have an S. U V. max of 3.2. The left upper lobe nodule also had increased uptake, though the SUV max of 1.5 was borderline. This smaller nodule measured 1.2 cm.  Bronchoscopy with brushings and trans-bronchial biopsy 09/22/2010 showed only benign reactive epithelium (NZA 12-`328 and SZA 03-3041). On 11/23/2010 a cardiac CT scan incidentally showed the right lung mass to have increased to 1.8 cm. Repeat chest CT 12/27/2010 showed the now slightly spiculated right lung nodule to measure 2.2 cm. The left upper lobe nodule measured 1.4 cm and had not changed as compared to CT scan of August 2012.  On 01/05/2011 the patient underwent CT-guided biopsy of the right lung nodule, showing ([id number] [phone]) a chronic inflammatory infiltrate with an abundance of plasma cells. Clonality by kappa/lambda stains could not be demonstrated. This was felt to most likely to be a reactive process. However, repeat CT of the chest 07/25/2011 showed the ground glass opacity in the right middle lobe to have increased further, now with a cavitary center. The lingular lesion hot also increased slightly. Accordingly on 12/03/2011 the patient underwent  video-assisted thoracoscopic wedge resection of the more worrisome right middle lobe nodule. The pathology [id number] and [id number]) showed an atypical lymphoplasmacytic infiltrate which was reviewed in [place] by Dr. Cheri Fowler. Based on her careful review, it was not possible to distinguish between nodular lymphoid hyperplasia (a very and of lymphoid interstitial pneumonia), which would be benign, and a low-grade MALT lymphoma with plasmacytic differentiation. In favor of the latter was their in situ hybridization study for kappa and lambda light chains, which showed a kappa: lambda ratio of greater than 10:1 (upper limit in reactive cases being 4 :1). In favor of a "reactive" interpretation were are flow cytometry studies, which showed no monoclonal B-cell population or abnormal T cell phenotype. The patient's subsequent history is as detailed below.  INTERVAL HISTORY: Mr. 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  He was last seen by our office on 09/02/2012.  Since his last office visit he had 3 days of a recent "stomach bug" in which she experienced diarrhea during the last week of May.  His GI symptomatology has since resolved. Otherwise, the patient has been doing relatively well.    REVIEW OF SYSTEMS: Mr. [name] denies current angina symptoms, shortness of breath, cough, phlegm production, pleurisy, fever, drenching sweats, weight loss (he is bothered that he has had some weight gain), or unexplained fatigue. He states that he has chronic back pain and this has hampered his efforts to become more physically active, which is  a frustration for him.  He bruises easily, but he is on an antiplatelet agent (Effient) daily. A detailed review of systems was otherwise noncontributory.  PAST MEDICAL HISTORY: Past Medical History   Diagnosis Date  . Lung nodules     s/p bx with Dr. 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 CAD (coronary artery disease)     a.  8.2012 neg MV;  b.  05/12/2011 Inf STEMI - 100% RCA - 3.5x22 Resolute DES, Residual 80% LAD dzs. c. s/p planned DES to LAD 06/01/11.  Marland Kitchen Hyperlipidemia     takes Crestor daily  . History of tobacco abuse   . Joint pain   . Chronic back pain   . Bruises easily     takes Effient daily  . Enlarged prostate   . Depression with anxiety     takes Vibryd daily and Clonazepam prn; dx with PTSD and sees Dr.McKinney  . Erectile dysfunction   . Insomnia     takes an OTC sleep aide  . Carotid stenosis     dopplers 9/13:  RICA 40-59%    PAST SURGICAL HISTORY: Past Surgical History  Procedure Laterality Date  . Tonsillectomy    . Adenoidectomy    . Ankle fracture surgery    . Cervical disc surgery      3 level fusion with Cadaver Maurine Minister June 2009 successful.  No complications  . Fiberoptic bronchoscopy with endobronchial  09/23/2010  . L1 vertebroplasty and l1 core biopsy.  08/18/2010  . Right thigh abscess incision and drainage.  05/04/2010  . Right inguinal hernia repair with extended prolene hernia  12/24/2008  . Decompressive anterior cervical diskectomy, c5-c6 and c6-c7.  08/20/2007  . Cardiac catheterization    . Cardiac stents      2 stents   . Colonoscopy    . Cataracts removed      bilateral  . Right lung biopsy      FAMILY HISTORY Family History  Problem Relation Age of Onset  . Alcohol abuse Father   . Lung cancer Father   . Heart disease Mother   The patient's father died from lung cancer at the age of 75. He had a history of smoking. The patient's mother died at the age of 49. He was an only child.   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 This involved quite a bit of traveling. His wife of 16 years, Tyler Mayo, formerly worked in an Biomedical scientist. The patient  has a daughter from an earlier marriage, Tyler Mayo, who lives in [place]. [place] and worked for the [employer]. [employer]. Tyler Mayo has 2 children from an earlier marriage, [name] and [place]. The patient is Catholic and  Tyler Mayo is [employer].   ADVANCED DIRECTIVES: Not on file  HEALTH MAINTENANCE: History  Substance Use Topics  . Smoking status: Former Smoker -- 0.50 packs/day for 25 years    Types: Cigars    Quit date: 04/10/1991  . Smokeless tobacco: Never Used  . Alcohol Use: No     Comment: ETOH free x 71yrs     Colonoscopy: 02/27/2008  PSA: 11/29/2011  Bone density: Not on file  Lipid panel: Not on file  Allergies  Allergen Reactions  . Lipitor (Atorvastatin) Other (See Comments)    Joint and muscle pain    Current Outpatient Prescriptions  Medication Sig Dispense Refill  . aspirin EC 81 MG tablet Take 81 mg by mouth  daily.      . clonazePAM (KLONOPIN) 0.5 MG tablet Take 0.5 mg by mouth 3 (three) times daily. For anxiety.      . gabapentin (NEURONTIN) 100 MG capsule Take 100 mg by mouth 3 (three) times daily.       Marland Kitchen HYDROcodone-acetaminophen (NORCO/VICODIN) 5-325 MG per tablet Take 1 tablet by mouth every 8 (eight) hours as needed for pain (take 1 or 2 tablets 3 times daily as needed ).  40 tablet  0  . metoprolol tartrate (LOPRESSOR) 25 MG tablet Take 0.5 tablets (12.5 mg total) by mouth 2 (two) times daily.  30 tablet  4  . prasugrel (EFFIENT) 10 MG TABS Take 1 tablet (10 mg total) by mouth daily.  30 tablet  12  . QUEtiapine (SEROQUEL) 12.5 mg TABS Take 0.5 tablets (12.5 mg total) by mouth at bedtime.  30 tablet  0  . VIIBRYD 40 MG TABS Take 40 mg by mouth.        No current facility-administered medications for this visit.    OBJECTIVE: Elderly white male, in no acute distress Filed Vitals:   09/09/12 1410  BP: 125/85  Pulse: 85  Temp: 97 F (36.1 C)  Resp: 20     Body mass index is 33.07 kg/(m^2).    ECOG FS: 1  Sclerae unicteric, mild arcus senilis Oropharynx  clear No cervical or supraclavicular adenopathy; no axillary or inguinal adenopathy Lungs no rales or rhonchi; VATS scars nicely healed Heart regular rate and rhythm Abd benign, distended, normoactive bowel sounds MSK no peripheral edema Neuro: nonfocal; speech is her medical but slow with occasional difficulty in word finding; oriented x3 Skin: no rash, well-healed cervical surgical scare   LAB RESULTS: SPEP February 2012 showed an IgG of 2120, IgA 512, and IgM 363, all nonspecifically elevated. Kappa light chains were 3.09, lambda of 4.06, with a normal ratio at 0.76.  On 08/28/2012 his lab results showed:  Kappa free light chains 4.52, lambda free light chains 3.97, Kappa:Lambda ratio 1.14  Lab Results  Component Value Date   WBC 8.6 08/28/2012   NEUTROABS 3.2 08/28/2012   HGB 15.2 08/28/2012   HCT 45.2 08/28/2012   MCV 94.9 08/28/2012   PLT 265 08/28/2012      Chemistry      Component Value Date/Time   NA 141 08/28/2012 1138   NA 140 12/24/2011 0645   K 3.8 08/28/2012 1138   K 3.0* 12/24/2011 0645   CL 106 08/28/2012 1138   CL 103 12/24/2011 0645   CO2 22 08/28/2012 1138   CO2 28 12/24/2011 0645   BUN 13.1 08/28/2012 1138   BUN 12 07/29/2012 0219   CREATININE 1.3 08/28/2012 1138   CREATININE 1.30 07/29/2012 0219   CREATININE 0.96 12/24/2011 0645      Component Value Date/Time   CALCIUM 9.4 08/28/2012 1138   CALCIUM 9.2 12/24/2011 0645   ALKPHOS 102 08/28/2012 1138   ALKPHOS 78 12/17/2011 0851   AST 22 08/28/2012 1138   AST 23 12/17/2011 0851   ALT 26 08/28/2012 1138   ALT 23 12/17/2011 0851   BILITOT 0.57 08/28/2012 1138   BILITOT 0.7 12/17/2011 0851       No results found for this basename: LABCA2    No results found for this basename: INR,  in the last 168 hours  Urinalysis    Component Value Date/Time   COLORURINE YELLOW 12/17/2011 0233   APPEARANCEUR CLEAR 12/17/2011 0233   LABSPEC 1.022 12/17/2011 0233  PHURINE 5.5 12/17/2011 0233   GLUCOSEU NEGATIVE 12/17/2011 0233   HGBUR  NEGATIVE 12/17/2011 0233   HGBUR negative 04/13/2009 1008   BILIRUBINUR NEGATIVE 12/17/2011 0233   KETONESUR NEGATIVE 12/17/2011 0233   PROTEINUR NEGATIVE 12/17/2011 0233   UROBILINOGEN 0.2 12/17/2011 0233   NITRITE NEGATIVE 12/17/2011 0233   LEUKOCYTESUR NEGATIVE 12/17/2011 0233    STUDIES: No results found.  ASSESSMENT: 72 y.o. [place], [place]:  Status post VATS wedge resection of a middle lobe lesion 12/03/2011, the differential diagnosis including benign nodular lymphoid hyperplasia/ lymphoid interstitial pneumonia, and a low-grade MALT lymphoma  PLAN: Dr. [name] has previously discussed with Mr. Glaspy (and his wife) the nature of "white cell cancers", how they are evaluated, and there of variety.  Dr. 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 Both possibilities were discussed with Mr. Joanne Gavel. Specifically, if he has a reactive process, this requires only observation, and should be self-limiting.  If he has a low-grade non-Hodgkin's lymphoma, indications for treatment include anemia, thrombocytopenia, unexplained severe fatigue, unexplained loss of 10% of body weight, unexplained fevers or drenching sweats, or significant adenopathy. He has none of these indications, so even if we knew for a fact that he had a MALT lymphoma, we would likely only observe.  Dr. [name] and Mr. Arabie previously discussed the many treatments for this type of lymphoma, and they are generally well-tolerated.   We plan to see Mr. Colarusso again in 11/2012 and we will obtain a CT of the chest with contrast and laboratories of a CBC, CMP, LDH, kappa/lambda light chains, beta-2 microglobulin, SPEP and IFE with QIG prior to his next office visit.    If the patient's next CT scan of the chest and protein studies are unremarkable during his next office visit, Dr. [name] is considering lengthening the time  between office visits to every 6-8 months.  Mr. Bathe was encouraged to contact us in the interim if he develops any symptomatology, has any concerns, questions or problems.   Larina Bras NP-C 09/10/2012 2:02 PM

## 2012-11-25 ENCOUNTER — Telehealth: Payer: Self-pay | Admitting: Oncology

## 2012-11-25 ENCOUNTER — Other Ambulatory Visit: Payer: Medicare Other | Admitting: Lab

## 2012-11-25 NOTE — Telephone Encounter (Signed)
returned pt call and r/s missed lab....done

## 2012-11-27 ENCOUNTER — Other Ambulatory Visit (HOSPITAL_BASED_OUTPATIENT_CLINIC_OR_DEPARTMENT_OTHER): Payer: Medicare Other

## 2012-11-27 DIAGNOSIS — C8590 Non-Hodgkin lymphoma, unspecified, unspecified site: Secondary | ICD-10-CM

## 2012-11-27 DIAGNOSIS — D472 Monoclonal gammopathy: Secondary | ICD-10-CM

## 2012-11-27 DIAGNOSIS — C8589 Other specified types of non-Hodgkin lymphoma, extranodal and solid organ sites: Secondary | ICD-10-CM

## 2012-11-27 LAB — COMPREHENSIVE METABOLIC PANEL (CC13)
Albumin: 3.7 g/dL (ref 3.5–5.0)
Alkaline Phosphatase: 79 U/L (ref 40–150)
BUN: 18.7 mg/dL (ref 7.0–26.0)
CO2: 23 mEq/L (ref 22–29)
Glucose: 101 mg/dl (ref 70–140)
Total Bilirubin: 0.93 mg/dL (ref 0.20–1.20)

## 2012-11-27 LAB — CBC WITH DIFFERENTIAL/PLATELET
Basophils Absolute: 0.1 10*3/uL (ref 0.0–0.1)
Eosinophils Absolute: 0.6 10*3/uL — ABNORMAL HIGH (ref 0.0–0.5)
HGB: 14.6 g/dL (ref 13.0–17.1)
MCV: 93.8 fL (ref 79.3–98.0)
MONO#: 0.9 10*3/uL (ref 0.1–0.9)
NEUT#: 3.8 10*3/uL (ref 1.5–6.5)
RDW: 13.9 % (ref 11.0–14.6)
WBC: 8.6 10*3/uL (ref 4.0–10.3)
lymph#: 3.3 10*3/uL (ref 0.9–3.3)

## 2012-12-01 LAB — BETA 2 MICROGLOBULIN, SERUM: Beta-2 Microglobulin: 2.38 mg/L — ABNORMAL HIGH (ref 1.01–1.73)

## 2012-12-01 LAB — KAPPA/LAMBDA LIGHT CHAINS
Kappa:Lambda Ratio: 1.01 (ref 0.26–1.65)
Lambda Free Lght Chn: 3.54 mg/dL — ABNORMAL HIGH (ref 0.57–2.63)

## 2012-12-01 LAB — SPEP & IFE WITH QIG
Beta 2: 6.1 % (ref 3.2–6.5)
Beta Globulin: 6.3 % (ref 4.7–7.2)
IgA: 500 mg/dL — ABNORMAL HIGH (ref 68–379)
IgG (Immunoglobin G), Serum: 2090 mg/dL — ABNORMAL HIGH (ref 650–1600)

## 2012-12-02 ENCOUNTER — Ambulatory Visit: Payer: Medicare Other | Admitting: Oncology

## 2012-12-02 ENCOUNTER — Telehealth: Payer: Self-pay | Admitting: *Deleted

## 2012-12-02 NOTE — Telephone Encounter (Signed)
Pt called stating that he has the flu and can not make his appt today and to rs. gv appt for 12/16/12 @ 1:30pm... Pt is aware...td

## 2012-12-16 ENCOUNTER — Ambulatory Visit (HOSPITAL_BASED_OUTPATIENT_CLINIC_OR_DEPARTMENT_OTHER): Payer: Medicare Other | Admitting: Oncology

## 2012-12-16 ENCOUNTER — Telehealth: Payer: Self-pay | Admitting: *Deleted

## 2012-12-16 DIAGNOSIS — C8299 Follicular lymphoma, unspecified, extranodal and solid organ sites: Secondary | ICD-10-CM

## 2012-12-16 NOTE — Progress Notes (Signed)
Patient ID: Tyler Mayo, male   DOB: 1940/05/28, 72 y.o.   MRN: [id number]  [name] canceled his appointment today, but he came by the office in the morning wanting to know lab results. He did not have his repeat CT of the chest last month as scheduled.  His lab results continues to show a polyclonal elevation of all IgG components, the total protein being stable as compared to 3 months ago.  I am scheduling him for repeat lab work in December with a CT scan prior to that visit. We're calling him with this information.

## 2012-12-16 NOTE — Telephone Encounter (Signed)
This nurse arrived at 8:30 am to receive a walk in form from patient requesting to be seen earlier than his scheduled time for later this afternoon.  Having back pain and wants to be worked in to see an Research scientist (medical).  This nurse welcomed patient explaining the provider is on schedule today and will notify him of this request.  MD to lobby to speak with patient.

## 2012-12-16 NOTE — Telephone Encounter (Signed)
sw pt gv appt for labs on 03/09/13 @ 10am and ov on 03/17/13@ 1:30pm. Pt is aware that i will mail a letter/avs...td

## 2012-12-24 ENCOUNTER — Other Ambulatory Visit: Payer: Self-pay | Admitting: Cardiovascular Disease

## 2013-01-07 ENCOUNTER — Other Ambulatory Visit: Payer: Self-pay

## 2013-01-07 DIAGNOSIS — D381 Neoplasm of uncertain behavior of trachea, bronchus and lung: Secondary | ICD-10-CM

## 2013-01-28 ENCOUNTER — Other Ambulatory Visit: Payer: Self-pay | Admitting: Neurological Surgery

## 2013-01-28 DIAGNOSIS — M47816 Spondylosis without myelopathy or radiculopathy, lumbar region: Secondary | ICD-10-CM

## 2013-02-03 ENCOUNTER — Ambulatory Visit: Payer: Medicare Other | Admitting: Thoracic Surgery (Cardiothoracic Vascular Surgery)

## 2013-02-05 ENCOUNTER — Ambulatory Visit
Admission: RE | Admit: 2013-02-05 | Discharge: 2013-02-05 | Disposition: A | Discharge status: Home / Self Care | Payer: Medicare Other | Source: Ambulatory Visit | Attending: Neurological Surgery | Admitting: Neurological Surgery

## 2013-02-05 DIAGNOSIS — M47816 Spondylosis without myelopathy or radiculopathy, lumbar region: Secondary | ICD-10-CM

## 2013-02-10 ENCOUNTER — Telehealth: Payer: Self-pay

## 2013-02-10 NOTE — Telephone Encounter (Signed)
PT  IN A LOT OF PAIN NEEDS  BACK  SURGERY   NEEDS TO  HOLD  ASA  AND EFFIENT   FOR  1 WEEK  WILL FORWARD TO  DR Eden Emms FOR  REVIEW .Zack Seal

## 2013-02-10 NOTE — Telephone Encounter (Signed)
New problem     Need to come off ASA  & Effient for 1 week for back procedure.    Dr. [name] office # [phone]

## 2013-02-10 NOTE — Telephone Encounter (Signed)
That's ok stents were over a year ago

## 2013-02-12 ENCOUNTER — Other Ambulatory Visit: Payer: Self-pay

## 2013-02-12 NOTE — Telephone Encounter (Signed)
Follow Up   Pt's wife called to confirm that her husband can come off of Effient for procedure// Request a call back

## 2013-02-12 NOTE — Telephone Encounter (Signed)
Spoke with Yvette Rack, and she is aware that Dr. Eden Emms agreed for pt to be off Effient because the stents were over a year ago.

## 2013-02-26 ENCOUNTER — Other Ambulatory Visit: Payer: Self-pay | Admitting: Anesthesiology

## 2013-02-26 ENCOUNTER — Ambulatory Visit
Admission: RE | Admit: 2013-02-26 | Discharge: 2013-02-26 | Disposition: A | Discharge status: Home / Self Care | Payer: Medicare Other | Source: Ambulatory Visit | Attending: Anesthesiology | Admitting: Anesthesiology

## 2013-02-26 DIAGNOSIS — M549 Dorsalgia, unspecified: Secondary | ICD-10-CM

## 2013-03-02 ENCOUNTER — Telehealth: Payer: Self-pay | Admitting: *Deleted

## 2013-03-02 NOTE — Telephone Encounter (Signed)
Pt wife called to report noted concerns that she wants to make MD aware of.  Mrs Pandya states pt has had up to 6 episodes of " full body itching so bad that it wakes him up at night and I have to give him benadryl "  Then yesterday he awoke from a nap and had a very reddened and slightly swollen area at this eyebrow area- when his wife went to put some lotion on it she noticed it was dry and scaly - area today is still reddened.  Pt is scheduled for lab and CT next Monday with MD follow up on 12/9.  This concern will be given to MD.

## 2013-03-04 ENCOUNTER — Telehealth: Payer: Self-pay | Admitting: Cardiovascular Disease

## 2013-03-04 NOTE — Telephone Encounter (Signed)
New message     Pt had ct scan with back surgeon at [facility].  The report said something about changes with the aorta.  Wife want to know if Dr Eden Emms can look at this report and see if he needs to be seen.  This report was on his mychart.

## 2013-03-04 NOTE — Telephone Encounter (Signed)
Spoke w/wife.  She states Tyler Mayo had a CT of lumbar spine  on 11/20 at [facility].  States that it mentioned something about aorto.  She is going to try to fax report to [place].  Was sent to him on "my chart".  Could pull report up that was under imaging but did not see anything about aorta.  She would like Dr. Eden Emms to review this report.  She will be at home on Fri 11/28 at [phone]. Advised that I would route this to Dr. Eden Emms

## 2013-03-08 IMAGING — CT CT CHEST W/O CM
3 of 4 series · 16 of 30 positions shown, 17 images · non-contrast
Comparison: Chest x-ray of 08/10/2010 05/24/2009

CLINICAL DATA: Nodular density at the right lung base on recent
preoperative chest x-ray

CT CHEST WITHOUT CONTRAST
TECHNIQUE: Multidetector CT imaging of the chest was performed
following the standard protocol without IV contrast.

[Series 3: routine chest · axial · 0.76mm/px · z∈[-208,-13]mm · 4 of 65 slices shown, 5 images]
[im 13/65  mediastinal]
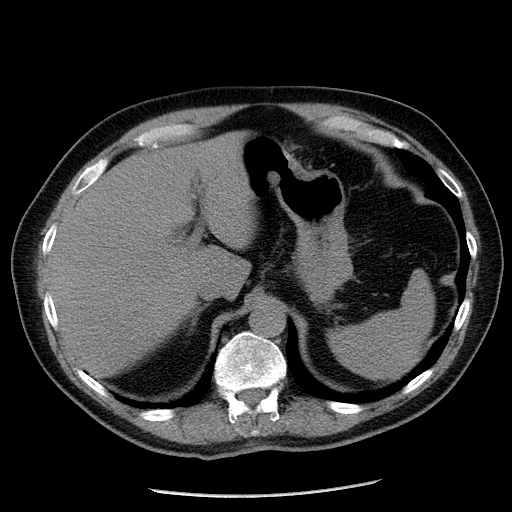
[im 13/65  lung]
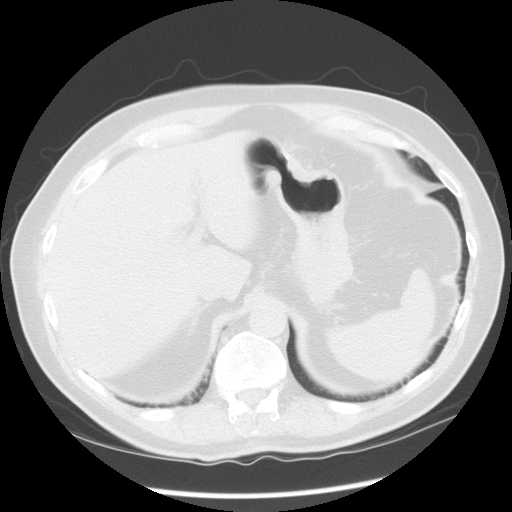
[im 26/65  lung]
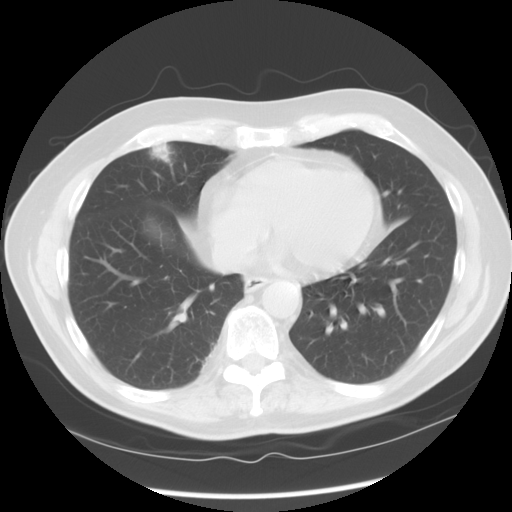
[im 39/65  lung]
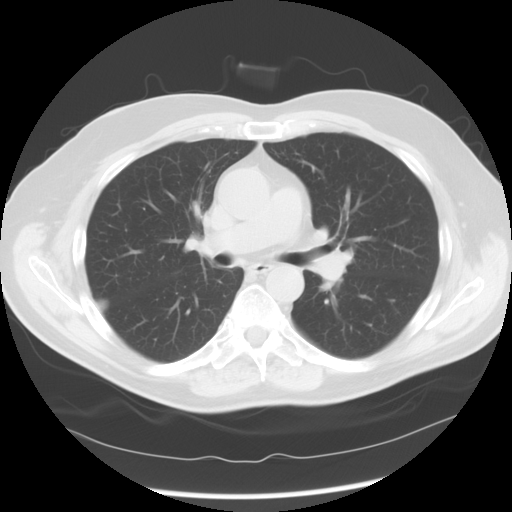
[im 52/65  lung]
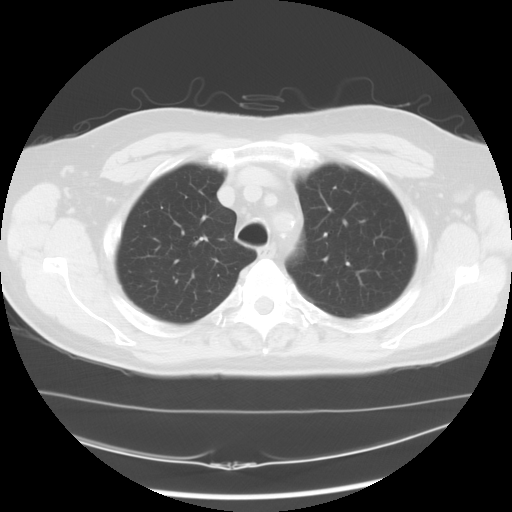

[Series 4: lung windows · axial · 0.76mm/px · z∈[-188,-8]mm · 4 of 61 slices shown]
[im 13/61  lung]
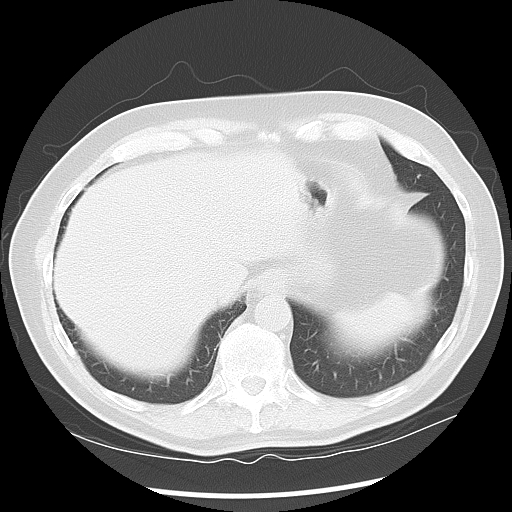
[im 25/61  lung]
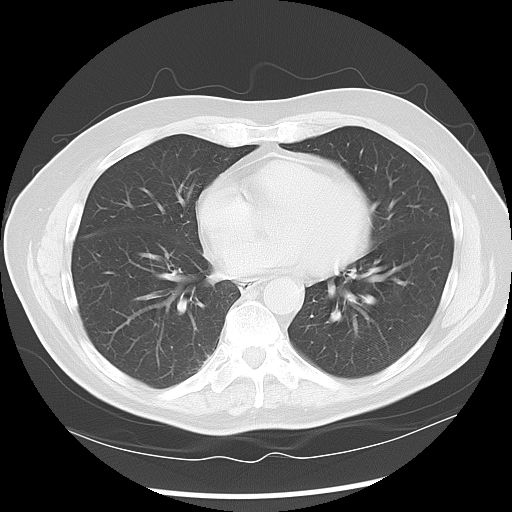
[im 37/61  lung]
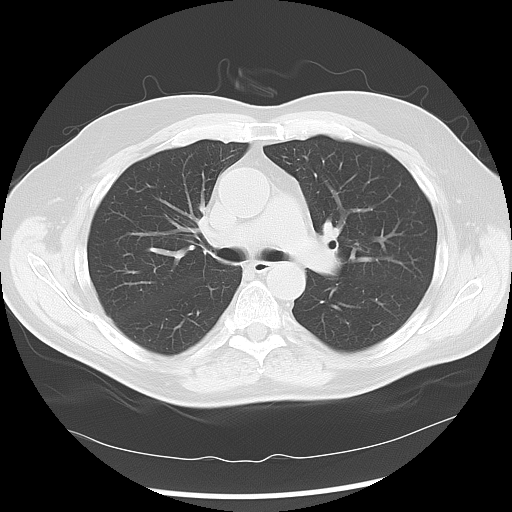
[im 49/61  lung]
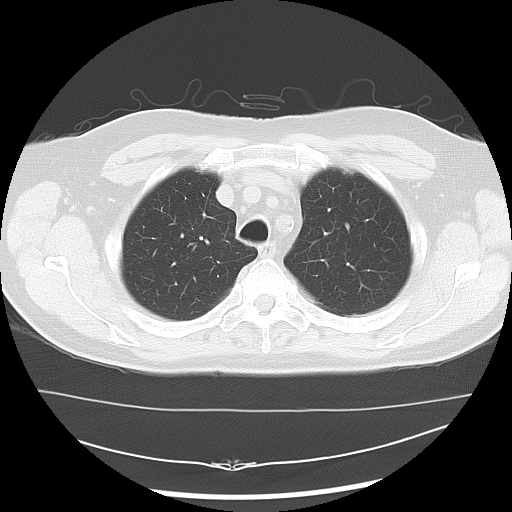

[Series 602: sagittal body · sagittal · 0.76mm/px · 8 of 158 slices shown]
[im 11/158  mediastinal]
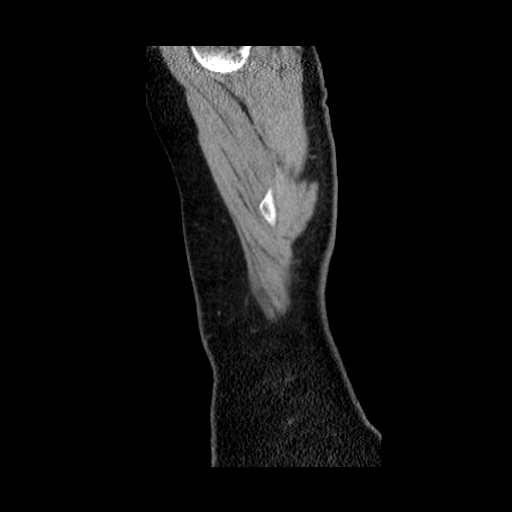
[im 32/158  mediastinal]
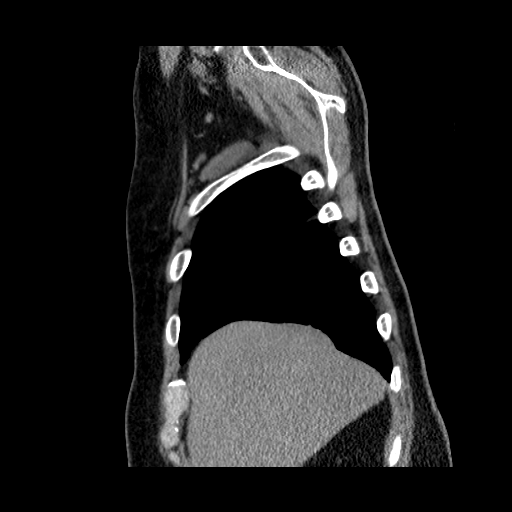
[im 53/158  mediastinal]
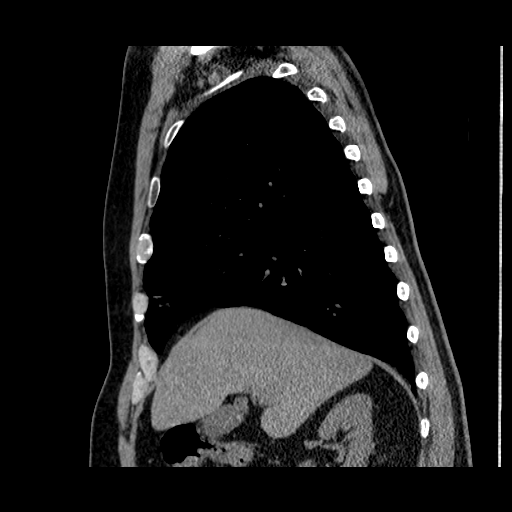
[im 74/158  mediastinal]
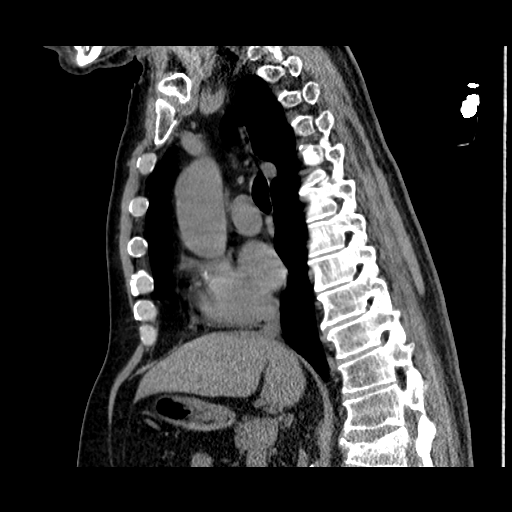
[im 84/158  mediastinal]
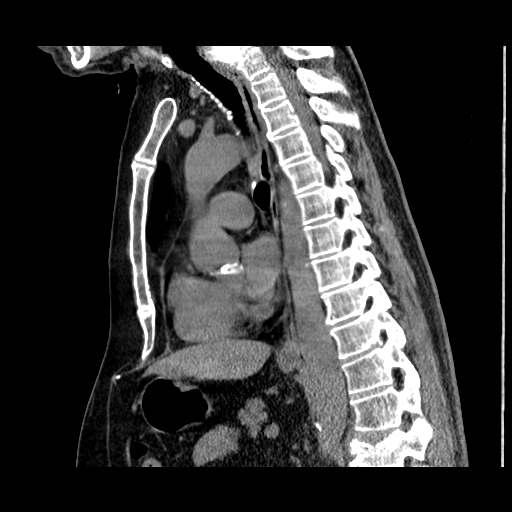
[im 105/158  mediastinal]
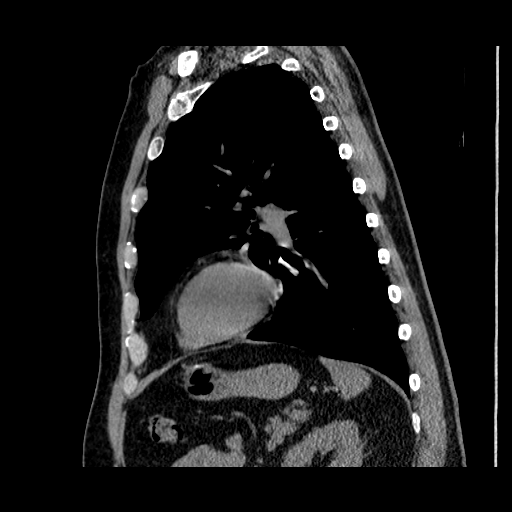
[im 126/158  mediastinal]
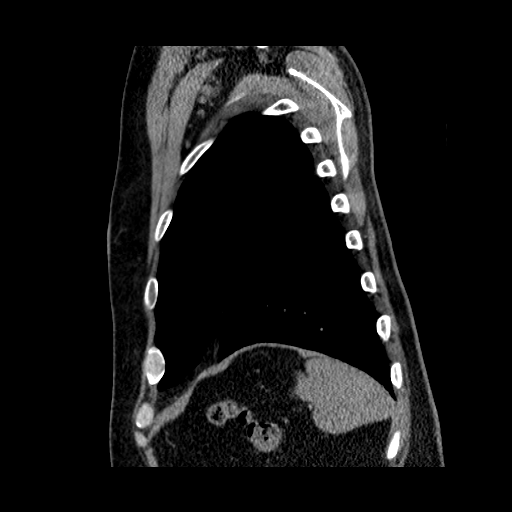
[im 147/158  mediastinal]
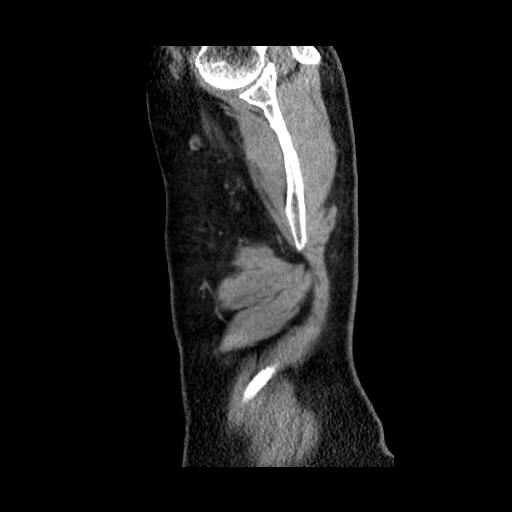

[16 of 30 positions shown; findings below may reference images not displayed]

FINDINGS: The opacity noted on chest x-ray at the right lung base
recently was not present on the May 2009 chest x-ray.  On the
current lung window images, this opacity corresponds to a
peripheral ground-glass opacity within the right middle lobe
measuring 17 mm in diameter.  In addition there is a less distinct
ground-glass opacity within the left upper lobe anteromedially
measuring 12 millimeters in diameter.  With two ground-glass
opacities present, both of which are greater than 1 cm in size,
thoracic surgery consultation is recommended.  No other parenchymal
abnormality is noted.  Minimal scarring is noted in the posterior
medial right lower lobe subpleurally.

On soft tissue window images, no mediastinal or hilar adenopathy is
seen.  Faint coronary artery calcifications are present and the
heart is within upper limits of normal.  No abnormality is seen on
the few images obtained through the upper abdomen.  A partial
compression deformity of L1 vertebral body is noted which may be
acute or subacute.  No significant retropulsion is seen.
IMPRESSION: 1.  Two ground-glass opacities one in the right middle lobe
peripherally and a second in the anterior medial left upper lobe
both of which are greater than 1 cm diameter and worrisome for low
grade adenocarcinoma.  Thoracic surgery consultation is
recommended.
2.  Compression deformity of L1 vertebral body which may be acute
or subacute.  No retropulsion.

## 2013-03-09 ENCOUNTER — Ambulatory Visit (HOSPITAL_COMMUNITY)
Admission: RE | Admit: 2013-03-09 | Discharge: 2013-03-09 | Disposition: A | Discharge status: Home / Self Care | Payer: Medicare Other | Source: Ambulatory Visit | Attending: Oncology | Admitting: Oncology

## 2013-03-09 ENCOUNTER — Other Ambulatory Visit (HOSPITAL_BASED_OUTPATIENT_CLINIC_OR_DEPARTMENT_OTHER): Payer: Medicare Other | Admitting: Lab

## 2013-03-09 DIAGNOSIS — J984 Other disorders of lung: Secondary | ICD-10-CM

## 2013-03-09 DIAGNOSIS — C8299 Follicular lymphoma, unspecified, extranodal and solid organ sites: Secondary | ICD-10-CM

## 2013-03-09 DIAGNOSIS — I359 Nonrheumatic aortic valve disorder, unspecified: Secondary | ICD-10-CM | POA: Insufficient documentation

## 2013-03-09 DIAGNOSIS — R911 Solitary pulmonary nodule: Secondary | ICD-10-CM | POA: Insufficient documentation

## 2013-03-09 DIAGNOSIS — I700 Atherosclerosis of aorta: Secondary | ICD-10-CM | POA: Insufficient documentation

## 2013-03-09 DIAGNOSIS — I2510 Atherosclerotic heart disease of native coronary artery without angina pectoris: Secondary | ICD-10-CM | POA: Insufficient documentation

## 2013-03-09 LAB — COMPREHENSIVE METABOLIC PANEL (CC13)
Anion Gap: 10 mEq/L (ref 3–11)
BUN: 13.3 mg/dL (ref 7.0–26.0)
CO2: 26 mEq/L (ref 22–29)
Calcium: 9.8 mg/dL (ref 8.4–10.4)
Chloride: 105 mEq/L (ref 98–109)
Creatinine: 1.2 mg/dL (ref 0.7–1.3)
Glucose: 115 mg/dl (ref 70–140)
Potassium: 3.8 mEq/L (ref 3.5–5.1)
Total Bilirubin: 0.88 mg/dL (ref 0.20–1.20)

## 2013-03-09 LAB — CBC WITH DIFFERENTIAL/PLATELET
BASO%: 0.9 % (ref 0.0–2.0)
Basophils Absolute: 0.1 10*3/uL (ref 0.0–0.1)
Eosinophils Absolute: 0.6 10*3/uL — ABNORMAL HIGH (ref 0.0–0.5)
HCT: 47.3 % (ref 38.4–49.9)
HGB: 15.6 g/dL (ref 13.0–17.1)
LYMPH%: 34.5 % (ref 14.0–49.0)
MCH: 31.4 pg (ref 27.2–33.4)
MCHC: 33 g/dL (ref 32.0–36.0)
MONO#: 0.9 10*3/uL (ref 0.1–0.9)
NEUT%: 49.2 % (ref 39.0–75.0)
Platelets: 277 10*3/uL (ref 140–400)
WBC: 9.8 10*3/uL (ref 4.0–10.3)
lymph#: 3.4 10*3/uL — ABNORMAL HIGH (ref 0.9–3.3)

## 2013-03-09 LAB — LACTATE DEHYDROGENASE (CC13): LDH: 155 U/L (ref 125–245)

## 2013-03-09 MED ORDER — IOHEXOL 300 MG/ML  SOLN
80.0000 mL | Freq: Once | INTRAMUSCULAR | Status: AC | PRN
  Administered 2013-03-09: 80 mL via INTRAVENOUS

## 2013-03-10 ENCOUNTER — Ambulatory Visit (INDEPENDENT_AMBULATORY_CARE_PROVIDER_SITE_OTHER): Payer: Medicare Other | Admitting: Thoracic Surgery (Cardiothoracic Vascular Surgery)

## 2013-03-10 ENCOUNTER — Encounter: Payer: Self-pay | Admitting: Thoracic Surgery (Cardiothoracic Vascular Surgery)

## 2013-03-10 VITALS — BP 116/78 | HR 73 | Resp 20 | Ht 70.0 in | Wt 230.0 lb

## 2013-03-10 DIAGNOSIS — R911 Solitary pulmonary nodule: Secondary | ICD-10-CM

## 2013-03-10 DIAGNOSIS — C8589 Other specified types of non-Hodgkin lymphoma, extranodal and solid organ sites: Secondary | ICD-10-CM

## 2013-03-10 DIAGNOSIS — C884 Extranodal marginal zone B-cell lymphoma of mucosa-associated lymphoid tissue [MALT-lymphoma]: Secondary | ICD-10-CM

## 2013-03-10 NOTE — Progress Notes (Signed)
HPI:  Tyler Mayo returns today for scheduled 6 month followup visit.  He is a 72 year old man who had bilateral lung nodules. He had been followed by Dr. [name] initially, and was referred to me with Dr. [name] retired. We did a wedge resection of the right middle lobe nodule for diagnostic purposes in August of 2013. 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 He does still have a groundglass opacity in the left lung which we have been following radiographically.  After his wedge resection of the right middle lobe he was readmitted with unusual neurologic complaints.  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 He does still have some issues with depression and memory loss. Those issues persist.  He says that he has been gaining weight. He complains of depression, fatigue, anhedonia. 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 He is taking narcotics for back pain. He has not had any chest pain, shortness of breath, cough or hemoptysis.  Past Medical History  Diagnosis Date  . Lung nodules     s/p bx with Dr. 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 CAD (coronary artery disease)     a.  8.2012 neg MV;  b.  05/12/2011 Inf STEMI - 100% RCA - 3.5x22 Resolute DES, Residual 80% LAD dzs. c. s/p planned DES to LAD 06/01/11.  Marland Kitchen Hyperlipidemia     takes Crestor daily  . History of tobacco abuse   . Joint pain   . Chronic back pain   . Bruises easily     takes Effient daily  . Enlarged prostate   . Depression with anxiety     takes Vibryd daily and Clonazepam prn; dx with PTSD and sees Dr.McKinney  . Erectile dysfunction   . Insomnia     takes an OTC sleep aide  . Carotid stenosis     dopplers 9/13:  RICA 40-59%       Current Outpatient Prescriptions  Medication Sig Dispense Refill  . aspirin EC 81 MG tablet Take 81 mg by mouth daily.      . clonazePAM (KLONOPIN) 0.5 MG tablet Take 0.5 mg by mouth 3 (three) times daily. For  anxiety.      . gabapentin (NEURONTIN) 100 MG capsule Take 100 mg by mouth 3 (three) times daily.       Marland Kitchen HYDROcodone-acetaminophen (NORCO/VICODIN) 5-325 MG per tablet Take 1 tablet by mouth every 8 (eight) hours as needed for pain (take 1 or 2 tablets 3 times daily as needed ).  40 tablet  0  . metoprolol tartrate (LOPRESSOR) 25 MG tablet TAKE 1/2 TABLET (12.5 MG TOTAL) BY MOUTH 2 (TWO) TIMES DAILY.  30 tablet  3  . prasugrel (EFFIENT) 10 MG TABS Take 1 tablet (10 mg total) by mouth daily.  30 tablet  12  . QUEtiapine (SEROQUEL) 12.5 mg TABS Take 0.5 tablets (12.5 mg total) by mouth at bedtime.  30 tablet  0  . VIIBRYD 40 MG TABS Take 40 mg by mouth.        No current facility-administered medications for this visit.    Physical Exam BP 116/78  Pulse 73  Resp 20  Ht 5\' 10"  (1.778 m)  Wt 230 lb (104.327 kg)  BMI 33.00 kg/m2  SpO87 48% 72 year old male in no acute distress Flat affect No cervical or supraclavicular adenopathy Cardiac regular rate and rhythm normal S1 and S2, no murmur Lungs clear with equal  breath sounds bilaterally  Diagnostic Tests: CT chest 03/09/2013 CT CHEST WITH CONTRAST  TECHNIQUE:  Multidetector CT imaging of the chest was performed during  intravenous contrast administration.  CONTRAST: 80mL OMNIPAQUE IOHEXOL 300 MG/ML SOLN  COMPARISON: Chest CT 07/29/2012.  FINDINGS:  Mediastinum: Heart size is normal. There is no significant  pericardial fluid, thickening or pericardial calcification. There is  atherosclerosis of the thoracic aorta, the great vessels of the  mediastinum and the coronary arteries, including calcified  atherosclerotic plaque in the left anterior descending and right  coronary arteries. Severe calcifications the aortic valve. No  pathologically enlarged mediastinal or hilar lymph nodes. Esophagus  is unremarkable in appearance.  Lungs/Pleura: Status post wedge resection in the right middle lobe.  Previously noted ground-glass  attenuation nodule in the left upper  lobe appears unchanged measuring 14 x 12 mm (image 21 of series 5),  associated with some retraction of the adjacent pleura. No other new  suspicious appearing pulmonary nodules masses otherwise noted. No  acute consolidative airspace disease. No effusions.  Upper Abdomen: Unremarkable.  Musculoskeletal: There are no aggressive appearing lytic or blastic  lesions noted in the visualized portions of the skeleton. Orthopedic  fixation hardware in the lower cervical spine incompletely  visualized.  IMPRESSION:  1. Status post wedge resection in the right middle lobe.  2. Unchanged 14 x 12 mm ground-glass attenuation nodule in the left  upper lobe. At this time, this lesion has been stable in size and  appearance since 08/11/2010, strongly favored to be a benign area  scarring. No new suspicious appearing pulmonary nodules masses are  otherwise noted.  3. Atherosclerosis, including 2 vessel coronary artery disease.  Assessment for potential risk factor modification, dietary therapy  or pharmacologic therapy may be warranted, if clinically indicated.  4. There are calcifications of the aortic valve. Echocardiographic  correlation for evaluation of potential valvular dysfunction may be  warranted if clinically indicated.  Electronically Signed  By: Trudie Reed M.D.  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 He did not require any treatment for that. He has a groundglass opacity in the left lung which is unchanged for about 2 years now. I have seen several instances where groundglass opacities have progressed after remaining stable for 2 years and feel that we need to continue to follow this. I recommended that we check it again in one year.   Plan: Return in one year with CT of chest

## 2013-03-16 ENCOUNTER — Ambulatory Visit: Payer: Medicare Other | Admitting: Cardiovascular Disease

## 2013-03-17 ENCOUNTER — Other Ambulatory Visit: Payer: Medicare Other | Admitting: Lab

## 2013-03-17 ENCOUNTER — Telehealth: Payer: Self-pay | Admitting: Oncology

## 2013-03-17 ENCOUNTER — Other Ambulatory Visit: Payer: Self-pay | Admitting: *Deleted

## 2013-03-17 ENCOUNTER — Ambulatory Visit (HOSPITAL_BASED_OUTPATIENT_CLINIC_OR_DEPARTMENT_OTHER): Payer: Medicare Other | Admitting: Oncology

## 2013-03-17 VITALS — BP 128/86 | HR 98 | Temp 97.9°F | Resp 18 | Ht 70.0 in | Wt 238.1 lb

## 2013-03-17 DIAGNOSIS — IMO0002 Reserved for concepts with insufficient information to code with codable children: Secondary | ICD-10-CM

## 2013-03-17 DIAGNOSIS — R918 Other nonspecific abnormal finding of lung field: Secondary | ICD-10-CM

## 2013-03-17 DIAGNOSIS — R799 Abnormal finding of blood chemistry, unspecified: Secondary | ICD-10-CM

## 2013-03-17 DIAGNOSIS — D381 Neoplasm of uncertain behavior of trachea, bronchus and lung: Secondary | ICD-10-CM

## 2013-03-17 DIAGNOSIS — M549 Dorsalgia, unspecified: Secondary | ICD-10-CM

## 2013-03-17 NOTE — Progress Notes (Signed)
ID: Page Spiro   DOB: 12-29-1940  MR#: [id number]  CSN#:[id number]  PCP: Evette Georges, MD SU: Andrey Spearman, MD OTHER MD: Marcelyn Bruins, MD,  Ihor Gully, MD, Charlton Haws, MD, [name] M.D.   HISTORY OF PRESENT ILLNESS: Tyler Mayo had a chest x-ray preop a planned L1 vertebral plasty 05/24/2009. This showed a nodular density in the medial right lower lung. CT scan of the chest was obtained the next day, and showed a 2 ground glass opacities, one in the right middle lobe peripherally, a second one in the anterior medial left upper lobe. Both of were greater than 1 cm him and felt to be worrisome for low-grade adenocarcinoma. Consultation with thoracic surgery was obtained, and on 09/11/2010 the patient had a PET scan which showed the right middle lobe nodule, measuring 1.5 cm, to have an S. U V. max of 3.2. The left upper lobe nodule also had increased uptake, though the SUV max of 1.5 was borderline. This smaller nodule measured 1.2 cm.  Bronchoscopy with brushings and trans-bronchial biopsy 09/22/2010 showed only benign reactive epithelium (NZA [id number] and SZA [id number]). On 11/23/2010 a cardiac CT scan incidentally showed the right lung mass to have increased to 1.8 cm. Repeat chest CT 12/27/2010 showed the now slightly spiculated right lung nodule to measure 2.2 cm. The left upper lobe nodule measured 1.4 cm and had not changed as compared to CT scan of August 2012.  On 01/05/2011 the patient underwent CT-guided biopsy of the right lung nodule, showing (SZA [phone]) a chronic inflammatory infiltrate with an abundance of plasma cells. Clonality by kappa/lambda stains could not be demonstrated. This was felt to most likely to be a reactive process. However, repeat CT of the chest 07/25/2011 showed the ground glass opacity in the right middle lobe to have increased further, now with a cavitary center. The lingular lesion hot also increased slightly. Accordingly on 12/03/2011 the patient  underwent video-assisted thoracoscopic wedge resection of the more worrisome right middle lobe nodule. The pathology [phone] and [id number]) showed an atypical lymphoplasmacytic infiltrate which was reviewed in [place] by Dr. Cheri Fowler. Based on her careful review, it was not possible to distinguish between nodular lymphoid hyperplasia (a very and of lymphoid interstitial pneumonia), which would be benign, and a low-grade MALT lymphoma with plasmacytic differentiation. In favor of the latter was their in situ hybridization study for kappa and lambda light chains, which showed a kappa: lambda ratio of greater than 10:1 (upper limit in reactive cases being 4 :1). In favor of a "reactive" interpretation were are flow cytometry studies, which showed no monoclonal B-cell population or abnormal T cell phenotype. The patient's subsequent history is as detailed below.  INTERVAL HISTORY: Tyler Mayo returns today for followup of his possible lymphoma, accompanied by his wife Tyler Mayo. Since her last visit here she had a CT of the chest which is entirely Mayo and also a CT of the lumbar spine which shows extensive degenerative changes. There is no evidence of cancer in either study.  REVIEW OF SYSTEMS: The back pain is severe. He tells me he is working with [name] regarding this and that he has had some additional studies, but felt too bad to go to an appointment and so his next appointment is not until January 6. In the meantime he can barely walk. He also itchy, which is possibly related to his Vicodin. He is mildly constipated. He feels anxious and depressed. He denies any fevers, drenching sweats, weight  loss, or adenopathy. There has been no cough, phlegm production, pleurisy, or worsening shortness of breath. A detailed review of systems today was otherwise Mayo  PAST MEDICAL HISTORY: Past Medical History  Diagnosis Date  . Lung nodules     s/p bx with Dr. 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 CAD (coronary artery disease)     a.  8.2012 neg MV;  b.  05/12/2011 Inf STEMI - 100% RCA - 3.5x22 Resolute DES, Residual 80% LAD dzs. c. s/p planned DES to LAD 06/01/11.  Marland Kitchen Hyperlipidemia     takes Crestor daily  . History of tobacco abuse   . Joint pain   . Chronic back pain   . Bruises easily     takes Effient daily  . Enlarged prostate   . Depression with anxiety     takes Vibryd daily and Clonazepam prn; dx with PTSD and sees Dr.McKinney  . Erectile dysfunction   . Insomnia     takes an OTC sleep aide  . Carotid stenosis     dopplers 9/13:  RICA 40-59%    PAST SURGICAL HISTORY: Past Surgical History  Procedure Laterality Date  . Tonsillectomy    . Adenoidectomy    . Ankle fracture surgery    . Cervical disc surgery      3 level fusion with Cadaver Maurine Minister June 2009 successful.  No complications  . Fiberoptic bronchoscopy with endobronchial  09/23/2010  . L1 vertebroplasty and l1 core biopsy.  08/18/2010  . Right thigh abscess incision and drainage.  05/04/2010  . Right inguinal hernia repair with extended prolene hernia  12/24/2008  . Decompressive anterior cervical diskectomy, c5-c6 and c6-c7.  08/20/2007  . Cardiac catheterization    . Cardiac stents      2 stents   . Colonoscopy    . Cataracts removed      bilateral  . Right lung biopsy      FAMILY HISTORY Family History  Problem Relation Age of Onset  . Alcohol abuse Father   . Lung cancer Father   . Heart disease Mother   The patient's father died from lung cancer at the age of 66. He had a history of smoking. The patient's mother died at the age of 2. He was an only child.   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 This involved quite a bit of traveling. His wife of 16 years, Tyler Mayo, formerly worked in an Biomedical scientist. The patient has a daughter from an earlier marriage, Wednesday, who lives in [place]. [place]  and worked for the [employer]. Tyler Mayo has 2 children from an earlier marriage, [name] and [place]. The patient is Catholic and  Tyler Mayo is [employer].   ADVANCED DIRECTIVES: Not on file  HEALTH MAINTENANCE: History  Substance Use Topics  . Smoking status: Former Smoker -- 0.50 packs/day for 25 years    Types: Cigars    Quit date: 04/10/1991  . Smokeless tobacco: Never Used  . Alcohol Use: No     Comment: ETOH free x 60yrs     Colonoscopy: 02/27/2008  PSA: 11/29/2011  Bone density: Not on file  Lipid panel: Not on file  Allergies  Allergen Reactions  . Lipitor [Atorvastatin] Other (See Comments)    Joint and muscle pain    Current Outpatient Prescriptions  Medication Sig Dispense Refill  . aspirin EC 81 MG tablet Take 81 mg by mouth  daily.      . clonazePAM (KLONOPIN) 0.5 MG tablet Take 0.5 mg by mouth 3 (three) times daily. For anxiety.      . gabapentin (NEURONTIN) 100 MG capsule Take 100 mg by mouth 3 (three) times daily.       Marland Kitchen HYDROcodone-acetaminophen (NORCO/VICODIN) 5-325 MG per tablet Take 1 tablet by mouth every 8 (eight) hours as needed for pain (take 1 or 2 tablets 3 times daily as needed ).  40 tablet  0  . metoprolol tartrate (LOPRESSOR) 25 MG tablet TAKE 1/2 TABLET (12.5 MG TOTAL) BY MOUTH 2 (TWO) TIMES DAILY.  30 tablet  3  . prasugrel (EFFIENT) 10 MG TABS Take 1 tablet (10 mg total) by mouth daily.  30 tablet  12  . QUEtiapine (SEROQUEL) 12.5 mg TABS Take 0.5 tablets (12.5 mg total) by mouth at bedtime.  30 tablet  0  . VIIBRYD 40 MG TABS Take 40 mg by mouth.        No current facility-administered medications for this visit.    OBJECTIVE: Elderly white male, in no acute distress Filed Vitals:   03/17/13 1335  BP: 128/86  Pulse: 98  Temp: 97.9 F (36.6 C)  Resp: 18     Body mass index is 34.16 kg/(m^2).    ECOG FS: 1  Exam today was deferred  LAB RESULTS: SPEP February 2012 showed an IgG of 2120, IgA 512, and IgM 363, all nonspecifically  elevated. Kappa light chains were 3.09, lambda of 4.06, with a normal ratio at 0.76.  On 08/28/2012 his lab results showed:  Kappa free light chains 4.52, lambda free light chains 3.97, Kappa:Lambda ratio 1.14  Lab Results  Component Value Date   WBC 9.8 03/09/2013   NEUTROABS 4.8 03/09/2013   HGB 15.6 03/09/2013   HCT 47.3 03/09/2013   MCV 95.3 03/09/2013   PLT 277 03/09/2013      Chemistry      Component Value Date/Time   NA 141 03/09/2013 0931   NA 140 12/24/2011 0645   K 3.8 03/09/2013 0931   K 3.0* 12/24/2011 0645   CL 106 08/28/2012 1138   CL 103 12/24/2011 0645   CO2 26 03/09/2013 0931   CO2 28 12/24/2011 0645   BUN 13.3 03/09/2013 0931   BUN 12 07/29/2012 0219   CREATININE 1.2 03/09/2013 0931   CREATININE 1.30 07/29/2012 0219   CREATININE 0.96 12/24/2011 0645      Component Value Date/Time   CALCIUM 9.8 03/09/2013 0931   CALCIUM 9.2 12/24/2011 0645   ALKPHOS 77 03/09/2013 0931   ALKPHOS 78 12/17/2011 0851   AST 23 03/09/2013 0931   AST 23 12/17/2011 0851   ALT 19 03/09/2013 0931   ALT 23 12/17/2011 0851   BILITOT 0.88 03/09/2013 0931   BILITOT 0.7 12/17/2011 0851       No results found for this basename: LABCA2    No results found for this basename: INR,  in the last 168 hours  Urinalysis    Component Value Date/Time   COLORURINE YELLOW 12/17/2011 0233   APPEARANCEUR CLEAR 12/17/2011 0233   LABSPEC 1.022 12/17/2011 0233   PHURINE 5.5 12/17/2011 0233   GLUCOSEU NEGATIVE 12/17/2011 0233   HGBUR NEGATIVE 12/17/2011 0233   HGBUR negative 04/13/2009 1008   BILIRUBINUR NEGATIVE 12/17/2011 0233   KETONESUR NEGATIVE 12/17/2011 0233   PROTEINUR NEGATIVE 12/17/2011 0233   UROBILINOGEN 0.2 12/17/2011 0233   NITRITE NEGATIVE 12/17/2011 0233   LEUKOCYTESUR NEGATIVE 12/17/2011 0233  STUDIES: Ct Chest W Contrast  03/09/2013   CLINICAL DATA:  Followup evaluation of lung nodules. History of prior wedge resection.  EXAM: CT CHEST WITH CONTRAST  TECHNIQUE: Multidetector CT imaging of the chest was performed  during intravenous contrast administration.  CONTRAST:  80mL OMNIPAQUE IOHEXOL 300 MG/ML  SOLN  COMPARISON:  Chest CT 07/29/2012.  FINDINGS: Mediastinum: Heart size is normal. There is no significant pericardial fluid, thickening or pericardial calcification. There is atherosclerosis of the thoracic aorta, the great vessels of the mediastinum and the coronary arteries, including calcified atherosclerotic plaque in the left anterior descending and right coronary arteries. Severe calcifications the aortic valve. No pathologically enlarged mediastinal or hilar lymph nodes. Esophagus is unremarkable in appearance.  Lungs/Pleura: Status post wedge resection in the right middle lobe. Previously noted ground-glass attenuation nodule in the left upper lobe appears unchanged measuring 14 x 12 mm (image 21 of series 5), associated with some retraction of the adjacent pleura. No other new suspicious appearing pulmonary nodules masses otherwise noted. No acute consolidative airspace disease. No effusions.  Upper Abdomen: Unremarkable.  Musculoskeletal: There are no aggressive appearing lytic or blastic lesions noted in the visualized portions of the skeleton. Orthopedic fixation hardware in the lower cervical spine incompletely visualized.  IMPRESSION: 1. Status post wedge resection in the right middle lobe. 2. Unchanged 14 x 12 mm ground-glass attenuation nodule in the left upper lobe. At this time, this lesion has been Mayo in size and appearance since 08/11/2010, strongly favored to be a benign area scarring. No new suspicious appearing pulmonary nodules masses are otherwise noted. 3. Atherosclerosis, including 2 vessel coronary artery disease. Assessment for potential risk factor modification, dietary therapy or pharmacologic therapy may be warranted, if clinically indicated. 4. There are calcifications of the aortic valve. Echocardiographic correlation for evaluation of potential valvular dysfunction may be warranted if  clinically indicated.   Electronically Signed   By: Trudie Reed M.D.   On: 03/09/2013 13:18   Ct Lumbar Spine Wo Contrast  02/26/2013   CLINICAL DATA:  Back pain  EXAM: CT LUMBAR SPINE discogram  TECHNIQUE: Multidetector CT imaging of the lumbar spine was performed without intravenous contrast administration. Discogram with multilevel disc contrast has been performed and instilled by the referral Physician. Multiplanar CT image reconstructions were also generated.  COMPARISON:  02/05/2013  FINDINGS: There is straightening of the normally lordotic lumbar spine. L1 compression fracture containing methylmethacrylate cement is Mayo. There is a small amount of cement within the T12-L1 disc space. No new compression fracture.  T12-L1: Mild disc space narrowing without obvious disc herniation or stenosis. No disc contrast. Cement is present within the disc.  L1-2: No disc contrast at this level. Moderate disc space narrowing. Mild facet arthropathy.  L2-3: There is severe narrowing of the disc with vacuum. The disc is diffusely disrupted. There is a more prominent area of tearing in the left posterior lateral location at the 5 o'clock position leading to a disc protrusion. The protrusion is partially calcified and results in significant left lateral recess narrowing as well as foraminal narrowing.  L3-4: The disc is diffusely disrupted. It is moderately narrowed with vacuum phenomenon. Mild multifactorial stenosis is suspected at this level.  L4-5: There is moderate narrowing of the disc with vacuum. The disc is diffusely disrupted. Contrast is present within the nucleus and annulus. Mild multifactorial stenosis is suspected at this level. Facet osteophytes encroach upon the left foramen.  L5-S1: No evidence of disc contrast. The disc is severely  narrowed with vacuum. Posterior disc osteophytes encroach upon the central canal and foramina bilaterally. Facet osteophytes also encroach upon the foramina.   IMPRESSION: The L2-3, L3-4, and L4-5 discs are diffusely disrupted. There is more focal annular tearing at L2-3 in the left posterior lateral location leading to a left posterior lateral and foraminal protrusion.  Status post L1 vertebroplasty.  Severe degenerative disc disease at L5-S1. No disc contrast at this level.   Electronically Signed   By: Maryclare Bean M.D.   On: 02/26/2013 15:48    ASSESSMENT: 72 y.o. [place], [place]:  Status post VATS wedge resection of a middle lobe lesion 12/03/2011, the differential diagnosis including benign nodular lymphoid hyperplasia/ lymphoid interstitial pneumonia, and a low-grade MALT lymphoma  PLAN: At this point I do not have evidence that Tyler Mayo has cancer. He continues to have an elevated total protein, and we are going to be following that with labs in April and October; however we could not find any evidence of monoclonality. It is very reassuring that the CT of the chest is Mayo. At this point I am not planning to repeat that test.  His biggest problem is back pain. He is working with [name] regarding that. I am going to refer him to a pain clinic just for some relief. He will keep me up-to-date on developments and specifically if he has not heard from the pain clinic within 48 hours with an appointment then he will let us know.  Tyler Mayo has a good understanding of this plan. He is in agreement with that. He knows to call for any other problems that may develop before his next visit here. 03/17/2013 1:50 PM Lowella Dell, MD

## 2013-03-17 NOTE — Telephone Encounter (Signed)
gv pt appt schedule for november 2015. lbs to be added tomorrow (template changes) and pt will be contacted - pt aware. per pt there was some mention during his f/u visit that he would be contacted w/a pain doctor. no referrral and no mention of this in the pof. message to GM to clarify

## 2013-03-18 ENCOUNTER — Telehealth: Payer: Self-pay | Admitting: Oncology

## 2013-03-18 NOTE — Telephone Encounter (Signed)
added lb appts that were not able to be added yesterday (template change). s/w pt today re lb for 07/21/13 and 01/12/14 both @ 1:30pm. and mailed schedule. s/w Skeet Simmer from [facility] re appt. per Skeet Simmer they are now on EPIC and referral will be sent to provider/medical staff for review. per Skeet Simmer pt will be contacted w/appt if they have services to offer. Referral is in EPIC changed referral clas to internal and dept to [facility]. pt aware.

## 2013-03-26 ENCOUNTER — Ambulatory Visit (INDEPENDENT_AMBULATORY_CARE_PROVIDER_SITE_OTHER): Payer: Medicare Other | Admitting: Cardiovascular Disease

## 2013-03-26 VITALS — BP 132/84 | HR 81 | Ht 70.0 in | Wt 236.0 lb

## 2013-03-26 DIAGNOSIS — I2510 Atherosclerotic heart disease of native coronary artery without angina pectoris: Secondary | ICD-10-CM

## 2013-03-26 DIAGNOSIS — F329 Major depressive disorder, single episode, unspecified: Secondary | ICD-10-CM

## 2013-03-26 DIAGNOSIS — E785 Hyperlipidemia, unspecified: Secondary | ICD-10-CM

## 2013-03-26 MED ORDER — NITROGLYCERIN 0.4 MG SL SUBL: 0.4000 mg | SUBLINGUAL_TABLET | SUBLINGUAL | Status: AC | PRN

## 2013-03-26 NOTE — Assessment & Plan Note (Signed)
Cholesterol is at goal.  Continue current dose of statin and diet Rx.  No myalgias or side effects.  F/U  LFT's in 6 months. Lab Results  Component Value Date   LDLCALC 68 08/16/2011

## 2013-03-26 NOTE — Assessment & Plan Note (Signed)
Stable with no angina and good activity level.  Continue medical Rx Nitro called into pharmacy 

## 2013-03-26 NOTE — Patient Instructions (Addendum)
Your physician wants you to follow-up in:  6 MONTHS WITH DR NISHAN  You will receive a reminder letter in the mail two months in advance. If you don't receive a letter, please call our office to schedule the follow-up appointment. Your physician recommends that you continue on your current medications as directed. Please refer to the Current Medication list given to you today. 

## 2013-03-26 NOTE — Assessment & Plan Note (Signed)
F/U Dr [name] q 6 months  Stable

## 2013-03-26 NOTE — Assessment & Plan Note (Signed)
?   Bipolar mood disorder improved Very functional at this time  Continue Seroquel

## 2013-03-26 NOTE — Progress Notes (Signed)
Patient ID: Tyler Mayo, male   DOB: [birth year], 72 y.o.   MRN: [id number] Tyler Mayo is a 72 y.o. male who returns for post hospital follow up.  He has a history of CAD, status post inferior STEMI 2/13 treated with a DES to the RCA. He had staged intervention with a DES to the mid LAD as well. He's been followed for lung nodules by Dr. Edwyna Mayo. He ultimately underwent wedge resection in 8/13. According to the notes, this was diagnosed as a low-grade lymphoma. He sees Dr. Dorris Mayo next week in follow up.  He was admitted in September for mental status changes. He had a question of tiny acute infarcts in the left frontal lobe on MRI. He was seen by neurology. Repeat MRI was unremarkable. EEG was unremarkable. He was off of his Effient for a time because of a concern he may need lumbar puncture. This was deferred and his Effient was restarted. He was seen by neurology who placed him on steroids for suspicion of paraneoplastic disorder. RPR and HIV were non-reactive. He had no cardiac complications during his admission.  He has seen neurology. He is off prednisone. Mental status is about the same. No new recommendations from neurology. The patient denies chest pain, shortness of breath, syncope, orthopnea, PND or significant pedal edema.  Dr Tyler Mayo alayed his fears about either reactive lung disease or low grade lymphoma which should do well   Quality of life limited by back  Sees Tyler Mayo and chronic pain clinic      ROS: Denies fever, malais, weight loss, blurry vision, decreased visual acuity, cough, sputum, SOB, hemoptysis, pleuritic pain, palpitaitons, heartburn, abdominal pain, melena, lower extremity edema, claudication, or rash.  All other systems reviewed and negative  General: Affect appropriate Overweight white male  HEENT: normal Neck supple with no adenopathy JVP normal no bruits no thyromegaly Lungs clear with no wheezing and good diaphragmatic motion Heart:  S1/S2 no  murmur, no rub, gallop or click PMI normal Abdomen: benighn, BS positve, no tenderness, no AAA no bruit.  No HSM or HJR Distal pulses intact with no bruits No edema Neuro non-focal Skin warm and dry No muscular weakness   Current Outpatient Prescriptions  Medication Sig Dispense Refill  . aspirin EC 81 MG tablet Take 81 mg by mouth daily.      . clonazePAM (KLONOPIN) 0.5 MG tablet Take 0.5 mg by mouth 3 (three) times daily. For anxiety.      . gabapentin (NEURONTIN) 100 MG capsule Take 100 mg by mouth 3 (three) times daily.       Marland Kitchen HYDROcodone-acetaminophen (NORCO/VICODIN) 5-325 MG per tablet Take 1 tablet by mouth every 8 (eight) hours as needed for pain (take 1 or 2 tablets 3 times daily as needed ).  40 tablet  0  . metoprolol tartrate (LOPRESSOR) 25 MG tablet TAKE 1/2 TABLET (12.5 MG TOTAL) BY MOUTH 2 (TWO) TIMES DAILY.  30 tablet  3  . prasugrel (EFFIENT) 10 MG TABS Take 1 tablet (10 mg total) by mouth daily.  30 tablet  12  . QUEtiapine (SEROQUEL) 12.5 mg TABS Take 0.5 tablets (12.5 mg total) by mouth at bedtime.  30 tablet  0  . VIIBRYD 40 MG TABS Take 40 mg by mouth.        No current facility-administered medications for this visit.    Allergies  Lipitor  Electrocardiogram:  SR rate 81 normal artifact in limb leads   Assessment and Plan

## 2013-03-27 ENCOUNTER — Encounter: Payer: Self-pay | Admitting: Physical Medicine & Rehabilitation

## 2013-03-27 ENCOUNTER — Encounter: Payer: Medicare Other | Attending: Physical Medicine & Rehabilitation

## 2013-03-27 ENCOUNTER — Ambulatory Visit (HOSPITAL_BASED_OUTPATIENT_CLINIC_OR_DEPARTMENT_OTHER): Payer: Medicare Other | Admitting: Physical Medicine & Rehabilitation

## 2013-03-27 DIAGNOSIS — Z79899 Other long term (current) drug therapy: Secondary | ICD-10-CM

## 2013-03-27 DIAGNOSIS — I2510 Atherosclerotic heart disease of native coronary artery without angina pectoris: Secondary | ICD-10-CM | POA: Insufficient documentation

## 2013-03-27 DIAGNOSIS — Z8673 Personal history of transient ischemic attack (TIA), and cerebral infarction without residual deficits: Secondary | ICD-10-CM | POA: Insufficient documentation

## 2013-03-27 DIAGNOSIS — M5137 Other intervertebral disc degeneration, lumbosacral region: Secondary | ICD-10-CM

## 2013-03-27 DIAGNOSIS — R413 Other amnesia: Secondary | ICD-10-CM | POA: Insufficient documentation

## 2013-03-27 DIAGNOSIS — G8929 Other chronic pain: Secondary | ICD-10-CM | POA: Insufficient documentation

## 2013-03-27 DIAGNOSIS — M545 Low back pain, unspecified: Secondary | ICD-10-CM | POA: Insufficient documentation

## 2013-03-27 DIAGNOSIS — M549 Dorsalgia, unspecified: Secondary | ICD-10-CM

## 2013-03-27 DIAGNOSIS — Z5181 Encounter for therapeutic drug level monitoring: Secondary | ICD-10-CM

## 2013-03-27 DIAGNOSIS — E785 Hyperlipidemia, unspecified: Secondary | ICD-10-CM | POA: Insufficient documentation

## 2013-03-27 NOTE — Patient Instructions (Addendum)
Aleve 2 tablets twice a day for the next month. Physical therapy to include extension exercise and TENS unit  If not much better next month may need to change medications including trial of tramadol or Tylenol 3 or hydrocodone

## 2013-03-27 NOTE — Progress Notes (Signed)
Subjective:    Patient ID: Tyler Mayo, male    DOB: Mar 24, 1941, 72 y.o.   MRN: [id number] Reviewed notes from cardiology , oncology HPI Back pain since 2011, had a fall L1 compression fracture had vertebroplasty but no significant improvement.   Right lower aspect of back.   Had cardiac rehab in past, had PT for his back in 2012, was not very helpful, tried bicycle, therapeutic ball exercise.   Re evaluated by Dr. Yetta Barre, discussed chronic pain centers, saw Dr Murray Hodgkins, tried epidural injections with excellent relief for 2 days Has had neurology evaluation for mental status changes. Possible small strokes. Was evaluated for Lymphoma but ruled out after extensive treatment  PSH:  2009 C5-6,C6-7 ACDF  Functional history: Up until about 5 years ago was playing golf. He is independent with all his self-care andmobility  Reviewed MRI of the lumbar spine from 02/05/2013. Severely degenerated L5-S1 disc. There is foraminal stenosis. Remaining lumbar discs were with normal height and only mild bulging Old L1 compression fracture with evidence of vertebroplasty Pain Inventory Average Pain 7 Pain Right Now 8 My pain is constant, sharp and tingling  In the last 24 hours, has pain interfered with the following? General activity 6 Relation with others 4 Enjoyment of life 4 What TIME of day is your pain at its worst? daytime Sleep (in general) Good  Pain is worse with: walking, bending and standing Pain improves with: rest and medication Relief from Meds: 8  Mobility walk without assistance how many minutes can you walk? 15 ability to climb steps?  yes do you drive?  yes Do you have any goals in this area?  yes  Function disabled: date disabled 04/2011 retired  Neuro/Psych numbness trouble walking confusion anxiety  Prior Studies x-rays CT/MRI  Physicians involved in your care Loletta Specter   Family History  Problem Relation Age of Onset  . Alcohol abuse Father     . Lung cancer Father   . Heart disease Mother    History   Social History  . Marital Status: Married    Spouse Name: N/A    Number of Children: N/A  . Years of Education: N/A   Occupational History  . 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 Smoking status: Former Smoker -- 0.50 packs/day for 25 years    Types: Cigars    Quit date: 04/10/1991  . Smokeless tobacco: Never Used  . Alcohol Use: No     Comment: ETOH free x 25yrs  . Drug Use: No  . Sexual Activity: Not Currently   Other Topics Concern  . None   Social History Narrative   Regular exercise   No brothers no sisters   Past Surgical History  Procedure Laterality Date  . Tonsillectomy    . Adenoidectomy    . Ankle fracture surgery    . Cervical disc surgery      3 level fusion with Cadaver Maurine Minister June 2009 successful.  No complications  . Fiberoptic bronchoscopy with endobronchial  09/23/2010  . L1 vertebroplasty and l1 core biopsy.  08/18/2010  . Right thigh abscess incision and drainage.  05/04/2010  . Right inguinal hernia repair with extended prolene hernia  12/24/2008  . Decompressive anterior cervical diskectomy, c5-c6 and c6-c7.  08/20/2007  . Cardiac catheterization    . Cardiac stents      2 stents   . Colonoscopy    . Cataracts removed      bilateral  .  Right lung biopsy     Past Medical History  Diagnosis Date  . Lung nodules     s/p bx with Dr. 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 CAD (coronary artery disease)     a.  8.2012 neg MV;  b.  05/12/2011 Inf STEMI - 100% RCA - 3.5x22 Resolute DES, Residual 80% LAD dzs. c. s/p planned DES to LAD 06/01/11.  Marland Kitchen Hyperlipidemia     takes Crestor daily  . History of tobacco abuse   . Joint pain   . Chronic back pain   . Bruises easily     takes Effient daily  . Enlarged prostate   . Depression with anxiety     takes Vibryd daily and Clonazepam prn; dx with PTSD and sees Dr.McKinney  . Erectile dysfunction   .  Insomnia     takes an OTC sleep aide  . Carotid stenosis     dopplers 9/13:  RICA 40-59%   There were no vitals taken for this visit.     Review of Systems  Musculoskeletal: Positive for back pain and gait problem.  Neurological: Positive for numbness.  Psychiatric/Behavioral: Positive for confusion. The patient is nervous/anxious.   All other systems reviewed and are negative.       Objective:   Physical Exam  Nursing note and vitals reviewed. Constitutional: He is oriented to person, place, and time.  Musculoskeletal:       Right hip: Normal.       Left hip: He exhibits decreased range of motion.       Right knee: Normal.       Left knee: Normal.  No pain with hip internal/external rotation Pain with forward flexion lumbar spine with 75% normal range Full lumbar extension without pain. 50% lateral bending bilaterally without pain  Neurological: He is alert and oriented to person, place, and time. He has normal strength. No sensory deficit. Gait normal.  Psychiatric: Thought content normal. His affect is blunt. His speech is delayed and tangential. Cognition and memory are impaired. He exhibits abnormal remote memory. He is inattentive.          Assessment & Plan:  #1. Chronic axial back pain located at the lumbosacral junction. This does not appear to be related to his prior L1 compression fracture. No evidence of radicular component. He has good lumbar flexion and extension and pain-free extension. He has had problems tolerating physical therapy in the past however I do think he can tolerate Thea Silversmith exercises and we will make referral to PT for this. Not a candidate for lumbar injections given anticoagulation status post stenting  Given history of memory loss, would avoid high dose narcotic analgesics.  He'll try over the counter Aleve for the next month however do not think your in his cardiac history he should remain on this for longer than that. This will be  just while he is in physical therapy.  If he requires for the medicines after that would consider tramadol

## 2013-05-04 ENCOUNTER — Ambulatory Visit: Payer: Medicare Other | Admitting: Physical Medicine & Rehabilitation

## 2013-05-11 ENCOUNTER — Other Ambulatory Visit: Payer: Self-pay | Admitting: Cardiovascular Disease

## 2013-05-13 ENCOUNTER — Other Ambulatory Visit: Payer: Self-pay | Admitting: Cardiovascular Disease

## 2013-07-11 ENCOUNTER — Other Ambulatory Visit: Payer: Self-pay | Admitting: Cardiovascular Disease

## 2013-07-21 ENCOUNTER — Other Ambulatory Visit: Payer: Medicare Other

## 2013-07-27 ENCOUNTER — Telehealth: Payer: Self-pay | Admitting: Oncology

## 2013-07-27 ENCOUNTER — Other Ambulatory Visit: Payer: Medicare Other

## 2013-07-29 ENCOUNTER — Telehealth: Payer: Self-pay | Admitting: *Deleted

## 2013-07-29 ENCOUNTER — Encounter: Payer: Self-pay | Admitting: Physician Assistant

## 2013-07-29 ENCOUNTER — Other Ambulatory Visit: Payer: Medicare Other

## 2013-07-29 NOTE — Telephone Encounter (Signed)
Patient calling in to discuss rash that comes and goes, no fever, uses Benadryl oral and cream. Instructed patient to refrain from sun exposure and use sun screen. Patient urged to see [facility], primary care, or local ER if he feels this rash is causing health issues. It has also been suggested he return to the Dermatologist he was sent to for further evaluation. After talking and listening to patient for approx 77mins, appt for lab only has been rescheduled to next week so that he may see this nurse, as he states, "I can trust you". Date/Time confirmed with patient.

## 2013-07-29 NOTE — Telephone Encounter (Signed)
Called patient as instructed by Tyler Flesher PA-C with instructions to be scheduled with Tyler Mayo.  He does "not wish to see Tyler Mayo.  I have a personal relationship with Tyler Mayo and was told I can come in and be seen anytime.  This is not good customer service.  This morning I was going to see him but now you say I cannot.  Are you saying Dr. Sherren Mocha is my only choice?  I am not getting a choice  That I am comfortable with."  Assessed further to learn the area is red, raised and itches.  "I am putting a cream on it I received from a dermatologist because it itches."  Having seen a dermatologist and obtained this cream, suggested he try dermatologist of he does not wish to see Dr. Sherren Mocha.  After twenty minutes on call patient says he wants to see Dr. Jana Hakim.  Will alert staff as he may walk in after the 3:00 lab appointment today.

## 2013-07-29 NOTE — Progress Notes (Unsigned)
No availability on schedules to be seen today due to staff shortage.  Since this has been going on since January, recommend contacting PCP for urgent needs or patient can be scheduled to see Dr. Earnest Conroy next week, 4/28.  Micah Flesher, PA-C

## 2013-07-29 NOTE — Telephone Encounter (Signed)
   Provider input needed: Work in today   Reason for call: Pustules to forehead and arms  Integumentary pustules fluctuate since first of the year    ALLERGIES:  is allergic to lipitor.  Patient last received chemotherapy/ treatment on n/a  Patient was last seen in the office on 03-17-2013  Next appt is 02-09-2014 with lab appt. Today at 3:00 pm  Is patient having fevers greater than 100.5?  no   Is patient having uncontrolled pain, or new pain? No, pain to back still an issue   Is patient having new back pain that changes with position (worsens or eases when laying down?)  no   Is patient able to eat and drink? yes    Is patient able to pass stool without difficulty?   yes     Is patient having uncontrolled nausea?  no    patient calls 07/29/2013 with complaint of  "Pustules to forehead and arms he wants evaluated TODAY.  Started the first of the year, they come and go but this time they're slightly larger in size.  I've called and need someone to call me at [phone]."   Summary Based on the above information advised patient to  Await return call from provider with next steps.   Tyler Mayo  07/29/2013, 11:41 AM   Background Info  Tyler Mayo   DOB: 1941/03/30   MR#: [id number]   CSN#   [id number] 07/29/2013

## 2013-08-05 ENCOUNTER — Other Ambulatory Visit: Payer: Medicare Other

## 2013-08-05 ENCOUNTER — Telehealth: Payer: Self-pay | Admitting: Oncology

## 2013-08-05 NOTE — Telephone Encounter (Signed)
pt wife cld to resch pt lab appt/stated she would resch because she will be off and able to bring him to next appt

## 2013-08-14 ENCOUNTER — Other Ambulatory Visit (HOSPITAL_BASED_OUTPATIENT_CLINIC_OR_DEPARTMENT_OTHER): Payer: Medicare Other

## 2013-08-14 ENCOUNTER — Other Ambulatory Visit: Payer: Self-pay | Admitting: Cardiovascular Disease

## 2013-08-14 DIAGNOSIS — D381 Neoplasm of uncertain behavior of trachea, bronchus and lung: Secondary | ICD-10-CM

## 2013-08-14 DIAGNOSIS — R918 Other nonspecific abnormal finding of lung field: Secondary | ICD-10-CM

## 2013-08-14 LAB — CBC WITH DIFFERENTIAL/PLATELET
BASO%: 0.7 % (ref 0.0–2.0)
Basophils Absolute: 0.1 10*3/uL (ref 0.0–0.1)
EOS%: 4.4 % (ref 0.0–7.0)
Eosinophils Absolute: 0.5 10*3/uL (ref 0.0–0.5)
HEMATOCRIT: 45 % (ref 38.4–49.9)
HGB: 15 g/dL (ref 13.0–17.1)
LYMPH%: 40 % (ref 14.0–49.0)
MCH: 31.3 pg (ref 27.2–33.4)
MCHC: 33.4 g/dL (ref 32.0–36.0)
MCV: 93.7 fL (ref 79.3–98.0)
MONO#: 1.1 10*3/uL — ABNORMAL HIGH (ref 0.1–0.9)
MONO%: 10.7 % (ref 0.0–14.0)
NEUT#: 4.6 10*3/uL (ref 1.5–6.5)
NEUT%: 44.2 % (ref 39.0–75.0)
Platelets: 267 10*3/uL (ref 140–400)
RBC: 4.8 10*6/uL (ref 4.20–5.82)
RDW: 13.7 % (ref 11.0–14.6)
WBC: 10.5 10*3/uL — ABNORMAL HIGH (ref 4.0–10.3)
lymph#: 4.2 10*3/uL — ABNORMAL HIGH (ref 0.9–3.3)

## 2013-08-14 LAB — COMPREHENSIVE METABOLIC PANEL (CC13)
ALBUMIN: 3.6 g/dL (ref 3.5–5.0)
ALK PHOS: 84 U/L (ref 40–150)
ALT: 14 U/L (ref 0–55)
AST: 18 U/L (ref 5–34)
Anion Gap: 8 mEq/L (ref 3–11)
BUN: 13.1 mg/dL (ref 7.0–26.0)
CALCIUM: 9.4 mg/dL (ref 8.4–10.4)
CO2: 23 mEq/L (ref 22–29)
Chloride: 107 mEq/L (ref 98–109)
Creatinine: 1.1 mg/dL (ref 0.7–1.3)
Glucose: 94 mg/dl (ref 70–140)
Potassium: 3.7 mEq/L (ref 3.5–5.1)
SODIUM: 138 meq/L (ref 136–145)
TOTAL PROTEIN: 8.4 g/dL — AB (ref 6.4–8.3)
Total Bilirubin: 0.62 mg/dL (ref 0.20–1.20)

## 2013-08-16 ENCOUNTER — Other Ambulatory Visit: Payer: Self-pay | Admitting: Oncology

## 2013-08-16 NOTE — Progress Notes (Unsigned)
Bill dropped in May 8 2 show me a rash he had over his arms. This is scattered, minimally palpable, slightly erythematous. He tells me he was given a cream by his dermatologist and had this worked, but that he only used it 2 days.  I don't think this is related to medication none. It looks more like an exposure rash. I suggested he go ahead and use the cream as prescribed. If that does not work. We can always call in a Medrol Dosepak for him.  He will call it there are any other questions regarding this.

## 2013-08-18 LAB — PROTEIN ELECTROPHORESIS, SERUM
Albumin ELP: 48.4 % — ABNORMAL LOW (ref 55.8–66.1)
Alpha-1-Globulin: 3.2 % (ref 2.9–4.9)
Alpha-2-Globulin: 9.8 % (ref 7.1–11.8)
BETA 2: 6 % (ref 3.2–6.5)
BETA GLOBULIN: 6.6 % (ref 4.7–7.2)
Gamma Globulin: 26 % — ABNORMAL HIGH (ref 11.1–18.8)
TOTAL PROTEIN, SERUM ELECTROPHOR: 8.7 g/dL — AB (ref 6.0–8.3)

## 2013-08-18 LAB — KAPPA/LAMBDA LIGHT CHAINS
Kappa free light chain: 5.84 mg/dL — ABNORMAL HIGH (ref 0.33–1.94)
Kappa:Lambda Ratio: 1.51 (ref 0.26–1.65)
LAMBDA FREE LGHT CHN: 3.86 mg/dL — AB (ref 0.57–2.63)

## 2013-08-28 ENCOUNTER — Telehealth: Payer: Self-pay | Admitting: *Deleted

## 2013-08-28 NOTE — Telephone Encounter (Signed)
Lab results reviewed with wife.

## 2013-09-11 ENCOUNTER — Other Ambulatory Visit: Payer: Self-pay | Admitting: Cardiovascular Disease

## 2013-09-17 ENCOUNTER — Encounter: Payer: Self-pay | Admitting: Neurology

## 2013-09-17 ENCOUNTER — Ambulatory Visit (INDEPENDENT_AMBULATORY_CARE_PROVIDER_SITE_OTHER): Payer: Medicare Other | Admitting: Neurology

## 2013-09-17 VITALS — BP 150/95 | HR 93 | Ht 69.5 in | Wt 231.0 lb

## 2013-09-17 DIAGNOSIS — R413 Other amnesia: Secondary | ICD-10-CM

## 2013-09-17 HISTORY — DX: Other amnesia: R41.3

## 2013-09-17 MED ORDER — DONEPEZIL HCL 5 MG PO TABS: 5.0000 mg | ORAL_TABLET | Freq: Every day | ORAL | Status: DC

## 2013-09-17 NOTE — Progress Notes (Signed)
Reason for visit: Memory disturbance   Tyler Mayo is a 73 y.o. male  History of present illness:  Tyler Mayo is a 73 year old right-handed white male with a history of a memory disturbance that dates back 4 years or more. The patient was seen through this office in September 2013. The patient had a history of increased confusion around the time of a VATS surgical procedure, but there was a history of some memory problems prior to the surgery. The patient was to followup in 3 months, but he never returned to the office for reevaluation. At that time, the Mini-Mental status examination revealed a score of 20/30. The patient has had ongoing progression of memory problems since that time. Over the last 6 months, the patient had an increase in functional deficits because of his memory issues. He stopped driving a motor vehicle approximately 2 years ago, and he had to give up paying the bills secondary to memory deficits 2 years ago. The patient no longer is able to manage his own medications or keep up with his appointments. The patient has inverted his sleep/wake cycle, staying awake at night, and sleeping during the day. The patient has begun wandering outside the house within the last month, and he has required assistance to get back to the house. His wife works during the week and she is not present at home. The patient is having increasing problems with word finding problems. The patient last had MRI evaluation of the brain in 2013, and the study was relatively unremarkable. He recently had blood work done through Dr. Jacelyn Grip which was unremarkable. He is sent to this office for an evaluation. The patient reports no focal numbness or weakness of the face, arms, or legs. The patient has mild gait instability, no recent falls. The denies problems controlling the bowels or the bladder. Occasionally, he may have a headache. He has a history of depression, and he is followed through psychiatry, Dr. Letta Moynahan.  Past Medical History  Diagnosis Date  . Lung nodules     s/p bx with Dr. 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 CAD (coronary artery disease)     a.  8.2012 neg MV;  b.  05/12/2011 Inf STEMI - 100% RCA - 3.5x22 Resolute DES, Residual 80% LAD dzs. c. s/p planned DES to LAD 06/01/11.  Marland Kitchen Hyperlipidemia     takes Crestor daily  . History of tobacco abuse   . Joint pain   . Chronic back pain   . Bruises easily     takes Effient daily  . Enlarged prostate   . Depression with anxiety     takes Vibryd daily and Clonazepam prn; dx with PTSD and sees Dr.McKinney  . Erectile dysfunction   . Insomnia     takes an OTC sleep aide  . Carotid stenosis     dopplers 9/13:  RICA 40-59%  . Memory deficit 09/17/2013    Past Surgical History  Procedure Laterality Date  . Tonsillectomy    . Adenoidectomy    . Ankle fracture surgery    . Cervical disc surgery      3 level fusion with Cadaver Simona Huh June 2009 successful.  No complications  . Fiberoptic bronchoscopy with endobronchial  09/23/2010  . L1 vertebroplasty and l1 core biopsy.  08/18/2010  . Right thigh abscess incision and drainage.  05/04/2010  . Right inguinal hernia repair with extended prolene hernia  12/24/2008  .  Decompressive anterior cervical diskectomy, c5-c6 and c6-c7.  08/20/2007  . Cardiac catheterization    . Cardiac stents      2 stents   . Colonoscopy    . Cataracts removed      bilateral  . Right lung biopsy      Family History  Problem Relation Age of Onset  . Alcohol abuse Father   . Lung cancer Father   . Heart disease Mother     Social history:  reports that he quit smoking about 22 years ago. His smoking use included Cigars. He has never used smokeless tobacco. He reports that he does not drink alcohol or use illicit drugs.  Medications:  Current Outpatient Prescriptions on File Prior to Visit  Medication Sig Dispense Refill  . aspirin EC 81 MG tablet Take 81 mg by  mouth daily.      . clonazePAM (KLONOPIN) 0.5 MG tablet Take 0.5 mg by mouth 3 (three) times daily. For anxiety.      Marland Kitchen EFFIENT 10 MG TABS tablet TAKE 1 TABLET (10 MG TOTAL) BY MOUTH DAILY.  30 tablet  6  . HYDROcodone-acetaminophen (NORCO/VICODIN) 5-325 MG per tablet Take 1 tablet by mouth every 8 (eight) hours as needed for pain (take 1 or 2 tablets 3 times daily as needed ).  40 tablet  0  . metoprolol tartrate (LOPRESSOR) 25 MG tablet TAKE 1/2 TABLET (12.5 MG TOTAL) BY MOUTH 2 (TWO) TIMES DAILY.  30 tablet  0  . VIIBRYD 40 MG TABS Take 40 mg by mouth.       . nitroGLYCERIN (NITROSTAT) 0.4 MG SL tablet Place 1 tablet (0.4 mg total) under the tongue every 5 (five) minutes as needed for chest pain.  25 tablet  4   No current facility-administered medications on file prior to visit.      Allergies  Allergen Reactions  . Lipitor [Atorvastatin] Other (See Comments)    Joint and muscle pain  . Crestor [Rosuvastatin] Other (See Comments)    Joint pain    ROS:  Out of a complete 14 system review of symptoms, the patient complains only of the following symptoms, and all other reviewed systems are negative.  Fatigue Skin rash Easy bruising, easy bleeding Snoring Achy muscles Memory loss, confusion, headache, weakness, dizziness, tremor Depression, anxiety, decreased energy, disinterest in activities Insomnia  Blood pressure 150/95, pulse 93, height 5' 9.5" (1.765 m), weight 231 lb (104.781 kg).  Physical Exam  General: The patient is alert and cooperative at the time of the examination.  Eyes: Pupils are equal, round, and reactive to light. Discs are flat bilaterally.  Neck: The neck is supple, no carotid bruits are noted.  Respiratory: The respiratory examination is clear.  Cardiovascular: The cardiovascular examination reveals a regular rate and rhythm, no obvious murmurs or rubs are noted.  Skin: Extremities are without significant edema.  Neurologic Exam  Mental  status: The Mini-Mental status examination done today shows a total score 21/30.   Cranial nerves: Facial symmetry is present. There is good sensation of the face to pinprick and soft touch bilaterally. The strength of the facial muscles and the muscles to head turning and shoulder shrug are normal bilaterally. Speech is well enunciated, no aphasia or dysarthria is noted. Extraocular movements are full. Visual fields are full. The tongue is midline, and the patient has symmetric elevation of the soft palate. No obvious hearing deficits are noted.  Motor: The motor testing reveals 5 over 5 strength of all  4 extremities. Good symmetric motor tone is noted throughout.  Sensory: Sensory testing is intact to pinprick, soft touch, vibration sensation, and position sense on all 4 extremities, with the exception that there is some decrease in position sense of the left foot. No evidence of extinction is noted.  Coordination: Cerebellar testing reveals good finger-nose-finger and heel-to-shin bilaterally.  Gait and station: Gait is normal. Tandem gait is normal. Romberg is negative. No drift is seen.  Reflexes: Deep tendon reflexes are symmetric and normal bilaterally. Toes are downgoing bilaterally.   MRI brain 12/19/2011:  IMPRESSION:  No convincing acute infarct or acute intracranial abnormality.    Assessment/Plan:  One. 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 His memory issues have led to a more significant change in functional level over the last 6 months. The patient has never been on any disease modifying agents, and he will be started on Aricept at this time. The patient has not undergone MRI evaluation of the brain in over 2 years, we will recheck MRI of the brain at this time. The patient will followup in 6 months. The patient likely will require increasing supervision over the ensuing months. The wife is  working during the week, and it is possible that she may be needed for supervision of the patient in the near future. The patient will contact me in one month if he is tolerating the Aricept, and we will increase the patient to the maintenance dose of 10 mg at night.  Jill Alexanders MD 09/17/2013 2:50 PM  [facility] [address] [place], [place] [address]  Phone [phone] Fax [phone]

## 2013-09-17 NOTE — Patient Instructions (Signed)
   Begin Aricept (donepezil) at 5 mg at night for one month. If this medication is well-tolerated, please call our office and we will call in a prescription for the 10 mg tablets. Look out for side effects that may include nausea, diarrhea, weight loss, or stomach cramps. This medication will also cause a runny nose, therefore there is no need for allergy medications for this purpose.  

## 2013-09-28 ENCOUNTER — Telehealth: Payer: Self-pay | Admitting: Neurology

## 2013-09-28 MED ORDER — RIVASTIGMINE 4.6 MG/24HR TD PT24: 4.6000 mg | MEDICATED_PATCH | Freq: Every day | TRANSDERMAL | Status: DC

## 2013-09-28 NOTE — Telephone Encounter (Signed)
Patient cannot take the Aricept as it makes him nauseous. Please call to advise. [phone]

## 2013-09-28 NOTE — Telephone Encounter (Signed)
Patient is experiencing nausea with Aricept. Please call to advise.

## 2013-09-28 NOTE — Telephone Encounter (Signed)
I called patient. The Aricept is causing a lot of nausea, we'll stop the medication. I'll try the Exelon patch to see we can avoid the nausea issue.

## 2013-09-30 ENCOUNTER — Telehealth: Payer: Self-pay | Admitting: Oncology

## 2013-09-30 NOTE — Telephone Encounter (Signed)
cld & spoke to pt and adv pt of r/s appt time & date-pt understood

## 2013-10-02 ENCOUNTER — Ambulatory Visit
Admission: RE | Admit: 2013-10-02 | Discharge: 2013-10-02 | Disposition: A | Discharge status: Home / Self Care | Payer: Medicare Other | Source: Ambulatory Visit | Attending: Neurology | Admitting: Neurology

## 2013-10-02 DIAGNOSIS — R413 Other amnesia: Secondary | ICD-10-CM

## 2013-10-05 ENCOUNTER — Telehealth: Payer: Self-pay | Admitting: Neurology

## 2013-10-05 NOTE — Telephone Encounter (Signed)
I called patient, talked with the wife. The MRI the brain has shown progressive atrophy since 2013, minimal small vessel disease. Within the last 3 or 4 weeks, the patient has begun having daily headaches. The patient has had prior cervical spine surgery, but the wife does not believe that the headaches are coming from the neck. The patient has not started any new medications that may be associated with the headache. We may need to get the patient in sooner than 6 months to evaluate the headache issues, it is not clear whether or not the patient has had a sedimentation rate to the primary care doctor. The wife will contact our office again. The patient will be getting on Exelon as he cannot tolerate Aricept.    MRI brain 10/05/2013:  Impression   Abnormal MRI brain (without) demonstrating: 1. Mild perisylvian and moderate mesial temporal atrophy. Mild-moderate  ventriculomegaly on ex vacuo basis. 2. Few scattered non-specific foci of periventricular and subcortical  gliosis. 3. No acute findings. 4. Mild progression of atrophy compared to MRI on 12/19/11.

## 2013-10-16 ENCOUNTER — Inpatient Hospital Stay (HOSPITAL_COMMUNITY): Payer: Medicare Other

## 2013-10-16 ENCOUNTER — Emergency Department (HOSPITAL_COMMUNITY): Payer: Medicare Other

## 2013-10-16 ENCOUNTER — Inpatient Hospital Stay (HOSPITAL_COMMUNITY)
Admission: EM | Admit: 2013-10-16 | Discharge: 2013-10-17 | DRG: 101 | Disposition: A | Discharge status: Home / Self Care | Payer: Medicare Other | Attending: Internal Medicine | Admitting: Internal Medicine

## 2013-10-16 ENCOUNTER — Encounter (HOSPITAL_COMMUNITY): Payer: Self-pay | Admitting: Emergency Medicine

## 2013-10-16 DIAGNOSIS — G40109 Localization-related (focal) (partial) symptomatic epilepsy and epileptic syndromes with simple partial seizures, not intractable, without status epilepticus: Principal | ICD-10-CM | POA: Diagnosis present

## 2013-10-16 DIAGNOSIS — R4182 Altered mental status, unspecified: Secondary | ICD-10-CM

## 2013-10-16 DIAGNOSIS — F0390 Unspecified dementia without behavioral disturbance: Secondary | ICD-10-CM | POA: Diagnosis present

## 2013-10-16 DIAGNOSIS — R40244 Other coma, without documented Glasgow coma scale score, or with partial score reported, unspecified time: Secondary | ICD-10-CM

## 2013-10-16 DIAGNOSIS — Z8249 Family history of ischemic heart disease and other diseases of the circulatory system: Secondary | ICD-10-CM

## 2013-10-16 DIAGNOSIS — R5383 Other fatigue: Secondary | ICD-10-CM

## 2013-10-16 DIAGNOSIS — E785 Hyperlipidemia, unspecified: Secondary | ICD-10-CM

## 2013-10-16 DIAGNOSIS — F341 Dysthymic disorder: Secondary | ICD-10-CM | POA: Diagnosis present

## 2013-10-16 DIAGNOSIS — R5381 Other malaise: Secondary | ICD-10-CM

## 2013-10-16 DIAGNOSIS — I2119 ST elevation (STEMI) myocardial infarction involving other coronary artery of inferior wall: Secondary | ICD-10-CM

## 2013-10-16 DIAGNOSIS — I252 Old myocardial infarction: Secondary | ICD-10-CM

## 2013-10-16 DIAGNOSIS — F419 Anxiety disorder, unspecified: Secondary | ICD-10-CM

## 2013-10-16 DIAGNOSIS — F528 Other sexual dysfunction not due to a substance or known physiological condition: Secondary | ICD-10-CM

## 2013-10-16 DIAGNOSIS — R413 Other amnesia: Secondary | ICD-10-CM

## 2013-10-16 DIAGNOSIS — G459 Transient cerebral ischemic attack, unspecified: Secondary | ICD-10-CM

## 2013-10-16 DIAGNOSIS — Z91199 Patient's noncompliance with other medical treatment and regimen due to unspecified reason: Secondary | ICD-10-CM

## 2013-10-16 DIAGNOSIS — I2584 Coronary atherosclerosis due to calcified coronary lesion: Secondary | ICD-10-CM

## 2013-10-16 DIAGNOSIS — E538 Deficiency of other specified B group vitamins: Secondary | ICD-10-CM

## 2013-10-16 DIAGNOSIS — Z801 Family history of malignant neoplasm of trachea, bronchus and lung: Secondary | ICD-10-CM

## 2013-10-16 DIAGNOSIS — D381 Neoplasm of uncertain behavior of trachea, bronchus and lung: Secondary | ICD-10-CM

## 2013-10-16 DIAGNOSIS — Z888 Allergy status to other drugs, medicaments and biological substances status: Secondary | ICD-10-CM

## 2013-10-16 DIAGNOSIS — G4700 Insomnia, unspecified: Secondary | ICD-10-CM | POA: Diagnosis present

## 2013-10-16 DIAGNOSIS — R918 Other nonspecific abnormal finding of lung field: Secondary | ICD-10-CM

## 2013-10-16 DIAGNOSIS — R404 Transient alteration of awareness: Secondary | ICD-10-CM

## 2013-10-16 DIAGNOSIS — R410 Disorientation, unspecified: Secondary | ICD-10-CM

## 2013-10-16 DIAGNOSIS — I2510 Atherosclerotic heart disease of native coronary artery without angina pectoris: Secondary | ICD-10-CM

## 2013-10-16 DIAGNOSIS — Z7982 Long term (current) use of aspirin: Secondary | ICD-10-CM

## 2013-10-16 DIAGNOSIS — F4310 Post-traumatic stress disorder, unspecified: Secondary | ICD-10-CM | POA: Diagnosis present

## 2013-10-16 DIAGNOSIS — K4090 Unilateral inguinal hernia, without obstruction or gangrene, not specified as recurrent: Secondary | ICD-10-CM

## 2013-10-16 DIAGNOSIS — Z87891 Personal history of nicotine dependence: Secondary | ICD-10-CM

## 2013-10-16 DIAGNOSIS — F329 Major depressive disorder, single episode, unspecified: Secondary | ICD-10-CM

## 2013-10-16 DIAGNOSIS — G40209 Localization-related (focal) (partial) symptomatic epilepsy and epileptic syndromes with complex partial seizures, not intractable, without status epilepticus: Secondary | ICD-10-CM

## 2013-10-16 DIAGNOSIS — C8590 Non-Hodgkin lymphoma, unspecified, unspecified site: Secondary | ICD-10-CM

## 2013-10-16 DIAGNOSIS — Z9119 Patient's noncompliance with other medical treatment and regimen: Secondary | ICD-10-CM

## 2013-10-16 DIAGNOSIS — C8589 Other specified types of non-Hodgkin lymphoma, extranodal and solid organ sites: Secondary | ICD-10-CM | POA: Diagnosis present

## 2013-10-16 DIAGNOSIS — F3289 Other specified depressive episodes: Secondary | ICD-10-CM

## 2013-10-16 LAB — CBC
HCT: 44.1 % (ref 39.0–52.0)
Hemoglobin: 14.9 g/dL (ref 13.0–17.0)
MCH: 31.7 pg (ref 26.0–34.0)
MCHC: 33.8 g/dL (ref 30.0–36.0)
MCV: 93.8 fL (ref 78.0–100.0)
PLATELETS: 235 10*3/uL (ref 150–400)
RBC: 4.7 MIL/uL (ref 4.22–5.81)
RDW: 13.9 % (ref 11.5–15.5)
WBC: 9.2 10*3/uL (ref 4.0–10.5)

## 2013-10-16 LAB — I-STAT TROPONIN, ED: Troponin i, poc: 0 ng/mL (ref 0.00–0.08)

## 2013-10-16 LAB — RAPID URINE DRUG SCREEN, HOSP PERFORMED
Amphetamines: NOT DETECTED
BENZODIAZEPINES: POSITIVE — AB
Barbiturates: NOT DETECTED
Cocaine: NOT DETECTED
OPIATES: POSITIVE — AB
Tetrahydrocannabinol: NOT DETECTED

## 2013-10-16 LAB — URINALYSIS, ROUTINE W REFLEX MICROSCOPIC
Bilirubin Urine: NEGATIVE
Glucose, UA: NEGATIVE mg/dL
HGB URINE DIPSTICK: NEGATIVE
KETONES UR: NEGATIVE mg/dL
Leukocytes, UA: NEGATIVE
Nitrite: NEGATIVE
PROTEIN: NEGATIVE mg/dL
Specific Gravity, Urine: 1.022 (ref 1.005–1.030)
Urobilinogen, UA: 0.2 mg/dL (ref 0.0–1.0)
pH: 5.5 (ref 5.0–8.0)

## 2013-10-16 LAB — COMPREHENSIVE METABOLIC PANEL
ALT: 12 U/L (ref 0–53)
ANION GAP: 16 — AB (ref 5–15)
AST: 17 U/L (ref 0–37)
Albumin: 3.5 g/dL (ref 3.5–5.2)
Alkaline Phosphatase: 75 U/L (ref 39–117)
BUN: 13 mg/dL (ref 6–23)
CALCIUM: 9 mg/dL (ref 8.4–10.5)
CHLORIDE: 100 meq/L (ref 96–112)
CO2: 23 mEq/L (ref 19–32)
Creatinine, Ser: 0.92 mg/dL (ref 0.50–1.35)
GFR calc Af Amer: >90 mL/min (ref >90)
GFR calc non Af Amer: 83 mL/min — ABNORMAL LOW (ref >90)
GLUCOSE: 89 mg/dL (ref 70–99)
POTASSIUM: 3.8 meq/L (ref 3.7–5.3)
Sodium: 139 mEq/L (ref 137–147)
Total Bilirubin: 0.7 mg/dL (ref 0.3–1.2)
Total Protein: 8.3 g/dL (ref 6.0–8.3)

## 2013-10-16 LAB — I-STAT CHEM 8, ED
BUN: 12 mg/dL (ref 6–23)
CREATININE: 1 mg/dL (ref 0.50–1.35)
Calcium, Ion: 1.11 mmol/L — ABNORMAL LOW (ref 1.13–1.30)
Chloride: 104 mEq/L (ref 96–112)
Glucose, Bld: 91 mg/dL (ref 70–99)
HCT: 48 % (ref 39.0–52.0)
Hemoglobin: 16.3 g/dL (ref 13.0–17.0)
POTASSIUM: 3.6 meq/L — AB (ref 3.7–5.3)
Sodium: 140 mEq/L (ref 137–147)
TCO2: 24 mmol/L (ref 0–100)

## 2013-10-16 LAB — APTT: APTT: 39 s — AB (ref 24–37)

## 2013-10-16 LAB — DIFFERENTIAL
BASOS ABS: 0.1 10*3/uL (ref 0.0–0.1)
Basophils Relative: 1 % (ref 0–1)
Eosinophils Absolute: 0.4 10*3/uL (ref 0.0–0.7)
Eosinophils Relative: 5 % (ref 0–5)
Lymphocytes Relative: 36 % (ref 12–46)
Lymphs Abs: 3.3 10*3/uL (ref 0.7–4.0)
Monocytes Absolute: 0.8 10*3/uL (ref 0.1–1.0)
Monocytes Relative: 8 % (ref 3–12)
NEUTROS ABS: 4.7 10*3/uL (ref 1.7–7.7)
NEUTROS PCT: 50 % (ref 43–77)

## 2013-10-16 LAB — PROTIME-INR
INR: 1.11 (ref 0.00–1.49)
PROTHROMBIN TIME: 14.3 s (ref 11.6–15.2)

## 2013-10-16 LAB — ETHANOL: Alcohol, Ethyl (B): <11 mg/dL (ref 0–11)

## 2013-10-16 MED ORDER — CLONAZEPAM 0.5 MG PO TABS
0.5000 mg | ORAL_TABLET | Freq: Two times a day (BID) | ORAL | Status: DC | PRN
  Administered 2013-10-16: 0.5 mg via ORAL
  Filled 2013-10-16: qty 1

## 2013-10-16 MED ORDER — HYDROCODONE-ACETAMINOPHEN 5-325 MG PO TABS
1.0000 | ORAL_TABLET | Freq: Three times a day (TID) | ORAL | Status: DC | PRN
  Administered 2013-10-17: 2 via ORAL
  Filled 2013-10-16 (×2): qty 2

## 2013-10-16 MED ORDER — VILAZODONE HCL 40 MG PO TABS
40.0000 mg | ORAL_TABLET | Freq: Every day | ORAL | Status: DC
Start: 2013-10-17 — End: 2013-10-17
  Administered 2013-10-17: 40 mg via ORAL
  Filled 2013-10-16: qty 1

## 2013-10-16 MED ORDER — SENNOSIDES-DOCUSATE SODIUM 8.6-50 MG PO TABS
1.0000 | ORAL_TABLET | Freq: Every evening | ORAL | Status: DC | PRN
  Filled 2013-10-16: qty 1

## 2013-10-16 MED ORDER — ASPIRIN EC 81 MG PO TBEC
81.0000 mg | DELAYED_RELEASE_TABLET | Freq: Every day | ORAL | Status: DC
  Administered 2013-10-17: 81 mg via ORAL
  Filled 2013-10-16: qty 1

## 2013-10-16 MED ORDER — ENOXAPARIN SODIUM 40 MG/0.4ML ~~LOC~~ SOLN
40.0000 mg | SUBCUTANEOUS | Status: DC
  Administered 2013-10-16: 40 mg via SUBCUTANEOUS
  Filled 2013-10-16 (×2): qty 0.4

## 2013-10-16 MED ORDER — PRASUGREL HCL 10 MG PO TABS
10.0000 mg | ORAL_TABLET | Freq: Every day | ORAL | Status: DC
  Administered 2013-10-17: 10 mg via ORAL
  Filled 2013-10-16: qty 1

## 2013-10-16 MED ORDER — METOPROLOL TARTRATE 12.5 MG HALF TABLET
12.5000 mg | ORAL_TABLET | Freq: Two times a day (BID) | ORAL | Status: DC
  Administered 2013-10-16 – 2013-10-17 (×2): 12.5 mg via ORAL
  Filled 2013-10-16 (×3): qty 1

## 2013-10-16 MED ORDER — STROKE: EARLY STAGES OF RECOVERY BOOK
Freq: Once | Status: AC
  Administered 2013-10-17: 10:00:00
  Filled 2013-10-16: qty 1

## 2013-10-16 MED ORDER — ONDANSETRON HCL 4 MG/2ML IJ SOLN: 4.0000 mg | Freq: Four times a day (QID) | INTRAMUSCULAR | Status: DC | PRN

## 2013-10-16 MED ORDER — HYDROCODONE-ACETAMINOPHEN 5-325 MG PO TABS
1.0000 | ORAL_TABLET | Freq: Three times a day (TID) | ORAL | Status: DC | PRN
  Administered 2013-10-16: 1 via ORAL
  Filled 2013-10-16: qty 1

## 2013-10-16 MED ORDER — VILAZODONE HCL 40 MG PO TABS: 40.0000 mg | ORAL_TABLET | Freq: Every day | ORAL | Status: DC

## 2013-10-16 NOTE — ED Provider Notes (Signed)
CSN: [id number]     Arrival date & time 10/16/13  1204 History   First MD Initiated Contact with Patient 10/16/13 1205     Chief Complaint  Patient presents with  . Weakness  . Altered Mental Status     (Consider location/radiation/quality/duration/timing/severity/associated sxs/prior Treatment) HPI Comments: Presents to ER for evaluation of altered mental status. Wife reports that he slept in late today and then when he woke up he was noted to be very confused. He was having trouble with speech, unable to talk to her initially. She did not notice any unilateral symptoms. Patient does not remember this event. Wife reports that he is currently being evaluated for possible early dementia. The events of today, however, are extremely unusual. He is not experiencing any headache, chest pain, shortness of breath.  Patient is a 73 y.o. male presenting with weakness and altered mental status.  Weakness  Altered Mental Status Associated symptoms: weakness     Past Medical History  Diagnosis Date  . Lung nodules     s/p bx with Dr. 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 CAD (coronary artery disease)     a.  8.2012 neg MV;  b.  05/12/2011 Inf STEMI - 100% RCA - 3.5x22 Resolute DES, Residual 80% LAD dzs. c. s/p planned DES to LAD 06/01/11.  Marland Kitchen Hyperlipidemia     takes Crestor daily  . History of tobacco abuse   . Joint pain   . Chronic back pain   . Bruises easily     takes Effient daily  . Enlarged prostate   . Depression with anxiety     takes Vibryd daily and Clonazepam prn; dx with PTSD and sees Dr.McKinney  . Erectile dysfunction   . Insomnia     takes an OTC sleep aide  . Carotid stenosis     dopplers 9/13:  RICA 40-59%  . Memory deficit 09/17/2013   Past Surgical History  Procedure Laterality Date  . Tonsillectomy    . Adenoidectomy    . Ankle fracture surgery    . Cervical disc surgery      3 level fusion with Cadaver Simona Huh June 2009 successful.  No  complications  . Fiberoptic bronchoscopy with endobronchial  09/23/2010  . L1 vertebroplasty and l1 core biopsy.  08/18/2010  . Right thigh abscess incision and drainage.  05/04/2010  . Right inguinal hernia repair with extended prolene hernia  12/24/2008  . Decompressive anterior cervical diskectomy, c5-c6 and c6-c7.  08/20/2007  . Cardiac catheterization    . Cardiac stents      2 stents   . Colonoscopy    . Cataracts removed      bilateral  . Right lung biopsy     Family History  Problem Relation Age of Onset  . Alcohol abuse Father   . Lung cancer Father   . Heart disease Mother    History  Substance Use Topics  . Smoking status: Former Smoker -- 0.50 packs/day for 25 years    Types: Cigars    Quit date: 04/10/1991  . Smokeless tobacco: Never Used  . Alcohol Use: No     Comment: ETOH free x 6yrs    Review of Systems  Neurological: Positive for speech difficulty and weakness.  All other systems reviewed and are negative.     Allergies  Aricept; Lipitor; and Crestor  Home Medications   Prior to Admission medications   Medication Sig Start Date End Date  Taking? Authorizing Provider  aspirin EC 81 MG tablet Take 81 mg by mouth daily.   Yes Historical Provider, MD  clonazePAM (KLONOPIN) 0.5 MG tablet Take 0.5 mg by mouth 2 (two) times daily as needed for anxiety.  12/24/11  Yes Shanker Kristeen Mans, MD  diphenhydrAMINE (BENADRYL) 25 MG tablet Take 25 mg by mouth every 6 (six) hours as needed for itching or allergies.    Yes Historical Provider, MD  HYDROcodone-acetaminophen (NORCO/VICODIN) 5-325 MG per tablet Take 1 tablet by mouth every 8 (eight) hours as needed for pain (take 1 or 2 tablets 3 times daily as needed ). 03/03/12  Yes Melrose Nakayama, MD  metoprolol tartrate (LOPRESSOR) 25 MG tablet Take 12.5 mg by mouth 2 (two) times daily.   Yes Historical Provider, MD  prasugrel (EFFIENT) 10 MG TABS tablet Take 10 mg by mouth daily.   Yes Historical Provider, MD   VIIBRYD 40 MG TABS Take 40 mg by mouth.  08/22/12  Yes Historical Provider, MD  nitroGLYCERIN (NITROSTAT) 0.4 MG SL tablet Place 1 tablet (0.4 mg total) under the tongue every 5 (five) minutes as needed for chest pain. 03/26/13   Josue Hector, MD   BP 150/90  Pulse 66  Temp(Src) 98.1 F (36.7 C) (Oral)  Resp 15  SpO2 96% Physical Exam  Constitutional: He is oriented to person, place, and time. He appears well-developed and well-nourished. No distress.  HENT:  Head: Normocephalic and atraumatic.  Right Ear: Hearing normal.  Left Ear: Hearing normal.  Nose: Nose normal.  Mouth/Throat: Oropharynx is clear and moist and mucous membranes are normal.  Eyes: Conjunctivae and EOM are normal. Pupils are equal, round, and reactive to light.  Neck: Normal range of motion. Neck supple.  Cardiovascular: Regular rhythm, S1 normal and S2 normal.  Exam reveals no gallop and no friction rub.   No murmur heard. Pulmonary/Chest: Effort normal and breath sounds normal. No respiratory distress. He exhibits no tenderness.  Abdominal: Soft. Normal appearance and bowel sounds are normal. There is no hepatosplenomegaly. There is no tenderness. There is no rebound, no guarding, no tenderness at McBurney's point and negative Murphy's sign. No hernia.  Musculoskeletal: Normal range of motion.  Neurological: He is alert and oriented to person, place, and time. He has normal strength. No cranial nerve deficit or sensory deficit. Coordination normal. GCS eye subscore is 4. GCS verbal subscore is 5. GCS motor subscore is 6.  Extraocular muscle movement: normal No visual field cut Pupils: equal and reactive both direct and consensual response is normal No nystagmus present            Sensory function is intact to light touch, pinprick Proprioception intact  Grip strength 5/5 symmetric in upper extremities Lower extremity strength 5/5 against gravity No pronator drift Normal finger to nose bilaterally Normal  heel to shin bilaterally     Skin: Skin is warm, dry and intact. No rash noted. No cyanosis.  Psychiatric: He has a normal mood and affect. His speech is normal and behavior is normal. Thought content normal.    ED Course  Procedures (including critical care time) Labs Review Labs Reviewed  APTT - Abnormal; Notable for the following:    aPTT 39 (*)    All other components within normal limits  COMPREHENSIVE METABOLIC PANEL - Abnormal; Notable for the following:    GFR calc non Af Amer 83 (*)    Anion gap 16 (*)    All other components within normal limits  URINE RAPID DRUG SCREEN (HOSP PERFORMED) - Abnormal; Notable for the following:    Opiates POSITIVE (*)    Benzodiazepines POSITIVE (*)    All other components within normal limits  URINALYSIS, ROUTINE W REFLEX MICROSCOPIC - Abnormal; Notable for the following:    APPearance HAZY (*)    All other components within normal limits  I-STAT CHEM 8, ED - Abnormal; Notable for the following:    Potassium 3.6 (*)    Calcium, Ion 1.11 (*)    All other components within normal limits  ETHANOL  PROTIME-INR  CBC  DIFFERENTIAL  I-STAT TROPOININ, ED  I-STAT TROPOININ, ED    Imaging Review Ct Head Wo Contrast  10/16/2013   CLINICAL DATA:  Weakness, altered mental status  EXAM: CT HEAD WITHOUT CONTRAST  TECHNIQUE: Contiguous axial images were obtained from the base of the skull through the vertex without intravenous contrast.  COMPARISON:  Head CT - 12/19/2011; 12/16/2011; brain MRI - 10/02/2013  FINDINGS: Grossly unchanged findings of centralized volume loss with commensurate ex vacuo dilatation of the ventricular system. Scattered periventricular hypodensities compatible microvascular ischemic disease. The gray-white differentiation is otherwise well maintained. No CT evidence of acute large territory infarct. No intraparenchymal or extra-axial mass or hemorrhage. Unchanged size and configuration of the ventricles and basilar cisterns. No  midline shift. Intracranial atherosclerosis. Limited visualization of the paranasal sinuses and mastoid air cells are normal. No air-fluid levels. Regional soft tissues are normal. Post bilateral cataract surgery. No displaced calvarial fracture.  IMPRESSION: Grossly unchanged findings of centralized atrophy and microvascular ischemic disease without acute intracranial process.   Electronically Signed   By: Sandi Mariscal M.D.   On: 10/16/2013 13:57     EKG Interpretation   Date/Time:  Friday October 16 2013 12:19:47 EDT Ventricular Rate:  72 PR Interval:  171 QRS Duration: 99 QT Interval:  416 QTC Calculation: 455 R Axis:   2 Text Interpretation:  Sinus rhythm RSR' in V1 or V2, probably normal  variant Confirmed by Shenandoah Yeats  MD, Becker  [phone]) on 10/16/2013 2:34:29  PM      MDM   Final diagnoses:  None   TIA  Patient presents to the ER for evaluation of mental status changes. He has been evaluated recently for possible dementia, but the events of today are unusual for him. He had did inability to speak this morning upon awakening. This slowly cleared and he was still confused for a period of time, now approximately back to his normal baseline. His workup has been unrevealing.  The hospitalist to evaluate him for admission for possible TIA versus an acute encephalopathy.  Orpah Greek, MD 10/16/13 [phone]

## 2013-10-16 NOTE — Consult Note (Addendum)
Referring Physician: Dr. Carles Collet    Chief Complaint: Altered mental status with speech difficulty.  HPI: Tyler Mayo is an 73 y.o. male history coronary artery disease, hyperlipidemia, carotid stenosis and memory deficit, and was brought to the emergency room following an episode of altered mental status with difficulty with speech as well as inattentiveness and staring. Onset was at 10:30 AM today. Patient's deficits had improved by the time he arrived in the emergency room. He did not present in code stroke status. CT scan of the head showed no acute intracranial abnormality. He has a history of episodes of altered mental status which have been attributed to TIAs. His been taking aspirin and Effient for antiplatelet therapy. NIH stroke score was 2, as patient did not know his correct age and the correct month.  LSN: 10:30 AM on 10/16/2013 tPA Given: No: Rapidly resolving deficits MRankin: 1  Past Medical History  Diagnosis Date  . Lung nodules     s/p bx with Dr. 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 CAD (coronary artery disease)     a.  8.2012 neg MV;  b.  05/12/2011 Inf STEMI - 100% RCA - 3.5x22 Resolute DES, Residual 80% LAD dzs. c. s/p planned DES to LAD 06/01/11.  Marland Kitchen Hyperlipidemia     takes Crestor daily  . History of tobacco abuse   . Joint pain   . Chronic back pain   . Bruises easily     takes Effient daily  . Enlarged prostate   . Depression with anxiety     takes Vibryd daily and Clonazepam prn; dx with PTSD and sees Dr.McKinney  . Erectile dysfunction   . Insomnia     takes an OTC sleep aide  . Carotid stenosis     dopplers 9/13:  RICA 40-59%  . Memory deficit 09/17/2013    Family History  Problem Relation Age of Onset  . Alcohol abuse Father   . Lung cancer Father   . Heart disease Mother      Medications: I have reviewed the patient's current medications.  ROS: History obtained from spouse  General ROS: negative for - chills,  fatigue, fever, night sweats, weight gain or weight loss Psychological ROS: Positive for progressive memory difficulty Ophthalmic ROS: negative for - blurry vision, double vision, eye pain or loss of vision ENT ROS: negative for - epistaxis, nasal discharge, oral lesions, sore throat, tinnitus or vertigo Allergy and Immunology ROS: negative for - hives or itchy/watery eyes Hematological and Lymphatic ROS: negative for - bleeding problems, bruising or swollen lymph nodes Endocrine ROS: negative for - galactorrhea, hair pattern changes, polydipsia/polyuria or temperature intolerance Respiratory ROS: negative for - cough, hemoptysis, shortness of breath or wheezing Cardiovascular ROS: negative for - chest pain, dyspnea on exertion, edema or irregular heartbeat Gastrointestinal ROS: negative for - abdominal pain, diarrhea, hematemesis, nausea/vomiting or stool incontinence Genito-Urinary ROS: negative for - dysuria, hematuria, incontinence or urinary frequency/urgency Musculoskeletal ROS: negative for - joint swelling or muscular weakness Neurological ROS: as noted in HPI Dermatological ROS: negative for rash and skin lesion changes  Physical Examination: Blood pressure 144/100, pulse 85, temperature 97.8 F (36.6 C), temperature source Oral, resp. rate 18, height 5\' 9"  (1.753 m), weight 104.327 kg (230 lb), SpO2 95.00%.  Neurologic Examination: Mental Status: Alert, disoriented to correct age and current month.  Speech fluent without evidence of aphasia. Patient tended to easily lose his train of thought. Able to follow commands without  difficulty. Cranial Nerves: II-Visual fields were normal. III/IV/VI-Pupils were equal and reacted. Extraocular movements were full and conjugate.    V/VII-no facial numbness and no facial weakness. VIII-normal. X-normal speech and symmetrical palatal movement. Motor: 5/5 bilaterally with normal tone and bulk Sensory: Normal throughout. Deep Tendon Reflexes:  2+ and symmetric. Plantars: Mute bilaterally Cerebellar: Normal finger-to-nose testing except for slight intention tremor, right greater than left.   Ct Head Wo Contrast  10/16/2013   CLINICAL DATA:  Weakness, altered mental status  EXAM: CT HEAD WITHOUT CONTRAST  TECHNIQUE: Contiguous axial images were obtained from the base of the skull through the vertex without intravenous contrast.  COMPARISON:  Head CT - 12/19/2011; 12/16/2011; brain MRI - 10/02/2013  FINDINGS: Grossly unchanged findings of centralized volume loss with commensurate ex vacuo dilatation of the ventricular system. Scattered periventricular hypodensities compatible microvascular ischemic disease. The gray-white differentiation is otherwise well maintained. No CT evidence of acute large territory infarct. No intraparenchymal or extra-axial mass or hemorrhage. Unchanged size and configuration of the ventricles and basilar cisterns. No midline shift. Intracranial atherosclerosis. Limited visualization of the paranasal sinuses and mastoid air cells are normal. No air-fluid levels. Regional soft tissues are normal. Post bilateral cataract surgery. No displaced calvarial fracture.  IMPRESSION: Grossly unchanged findings of centralized atrophy and microvascular ischemic disease without acute intracranial process.   Electronically Signed   By: Sandi Mariscal M.D.   On: 10/16/2013 13:57    Assessment: 73 y.o. male with multiple risk factors for stroke presenting following an episode of altered mental status with speech difficulty. Patient also had staring and was inattentive to surroundings. TIA cannot be ruled out. As well, complex partial seizure cannot be ruled out.  Stroke Risk Factors - carotid stenosis, hyperlipidemia and smoking  Plan: 1. HgbA1c, fasting lipid panel 2. MRI, MRA  of the brain without contrast 3. PT consult, OT consult, Speech consult 4. Echocardiogram 5. Carotid dopplers 6. Prophylactic therapy-Antiplatelet med:  Aspirin 81 mg per day and Effient 10 mg per day 7. EEG to rule out indications of partial seizure disorder, as well as slowing of cerebral activity. 8. Telemetry monitoring   C.R. Nicole Kindred, MD [facility] [phone]  10/16/2013, 8:28 PM

## 2013-10-16 NOTE — ED Notes (Signed)
MD at bedside. Admitting  

## 2013-10-16 NOTE — ED Notes (Signed)
73 yo male from home via [facility] with altered mental status, weakness and un witnessed fall per wife. Per EMS pt is now alert and oriented. Denies injury with fall. No hx of TIA or stroke. BP 1173/115

## 2013-10-16 NOTE — ED Notes (Signed)
MD at bedside. 

## 2013-10-16 NOTE — ED Notes (Signed)
Report Attempted

## 2013-10-16 NOTE — ED Notes (Signed)
Pain med given for his back pain

## 2013-10-16 NOTE — ED Notes (Signed)
Tyler Mayo [phone] (wife)

## 2013-10-16 NOTE — Progress Notes (Signed)
Patient is verbally aggressive and non compliant at times, does not want to be awaken for Q2hr neuro checks. Patient educated on the importance of neuro checks, however pt has made it clear that he does not want to be awaken by anyone. NP Lynch made aware, orders given to start Q4neuro checks at midnight. Will continue to educate and monitor patient.

## 2013-10-16 NOTE — ED Notes (Addendum)
Requested for pt to remain in the bed due to fall risk. Pt reports back pain and discomfort. Order released for pain

## 2013-10-16 NOTE — ED Notes (Signed)
Reported Attempted

## 2013-10-16 NOTE — ED Notes (Signed)
CSW spoke with pt and provided emotional support while he was in the ED waiting tx to the floor.

## 2013-10-16 NOTE — Progress Notes (Signed)
Pt arrived to unit alert and oriented. Pt upset that things are not being done how and when he wants them to be done. He continues to question me why the doctors lie to him about when test are to be done. I explained the procedure possible stroke patients without satisfaction. He is very argumentative about everything. Wife is at bedside and states this is his personality, but that he does have some dementia. Carroll Kinds RN

## 2013-10-16 NOTE — ED Notes (Signed)
Phlebotomy notified of labs

## 2013-10-16 NOTE — H&P (Signed)
History and Physical  Tyler Mayo HFW:[id number] DOB: 10/08/1940 DOA: 10/16/2013   PCP: Anthoney Harada, MD   Chief Complaint: Mental status change, worsening difficulty  HPI:  73 year old male with a history of hyperlipidemia, depression, dementia, and coronary artery disease with history of MI presents with an episode of word finding difficulty and mental status change around 10:30 AM on the day of admission. The patient is a poor historian due to his memory loss. Most of this history is obtained from speaking with the patient's wife at the bedside. The patient's wife noticed that the patient was coming out of the bathroom when he became unsteady. She quickly rushed to him and eased him down to the floor. During this period of time she noticed that he had a "glazed look" in his eyes and was staring aimlessly in to space.  During this time, he was not able to speak and had word finding difficulties. Wife also stated that his right thigh appeared red. She activated EMS. By the time EMS arrived, the patient was more alert but not normal. In addition, he was able to speak a few words.  The patient has not been started on any medications. In addition, the wife administers most of his medications. He has not had increased use of his Norco clonazepam or Benadryl. In emergency department, the patient's wife states that the patient is back to baseline.  Of note, the patient saw Dr. Jannifer Franklin in the office on 09/17/2013. An MRI on 10/02/2013 which showed moderate mesial temporal atrophy.  It is also noted that he has had a functional decline for the past 6 months and has had wandering behavior.  In emergency department, CT of the brain was negative for any acute findings. BMP, LFTs, CBC were unremarkable.  i-STAT troponin was negative. EKG showed sinus rhythm without ST changes. Urine drug screen was positive for benzodiazepines and opiates. UA neg for pyuria Assessment/Plan: TIA -MRI/MRA  brain -Echocardiogram -Carotid duplex -Hemoglobin A1C, lipid panel -EEG -continue ASA 81mg  and Effient (has been on this for over 2 yrs.) -consulted neurology -minimize opioids/hypnotics if possible CAD with history of MI -Continue ASA and Effient Depression -continue Viibryd Dementia -intolerance to Aricept -presently refuses Exelon patch prescribed by Dr. 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 Lung nodules     s/p bx with Dr. 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 CAD (coronary artery disease)     a.  8.2012 neg MV;  b.  05/12/2011 Inf STEMI - 100% RCA - 3.5x22 Resolute DES, Residual 80% LAD dzs. c. s/p planned DES to LAD 06/01/11.  Marland Kitchen Hyperlipidemia     takes Crestor daily  . History of tobacco abuse   . Joint pain   . Chronic back pain   . Bruises easily     takes Effient daily  . Enlarged prostate   . Depression with anxiety     takes Vibryd daily and Clonazepam prn; dx with PTSD and sees Dr.McKinney  . Erectile dysfunction   . Insomnia     takes an OTC sleep aide  . Carotid stenosis     dopplers 9/13:  RICA 40-59%  . Memory deficit 09/17/2013   Past Surgical History  Procedure Laterality Date  . Tonsillectomy    . Adenoidectomy    .  Ankle fracture surgery    . Cervical disc surgery      3 level fusion with Cadaver Simona Huh June 2009 successful.  No complications  . Fiberoptic bronchoscopy with endobronchial  09/23/2010  . L1 vertebroplasty and l1 core biopsy.  08/18/2010  . Right thigh abscess incision and drainage.  05/04/2010  . Right inguinal hernia repair with extended prolene hernia  12/24/2008  . Decompressive anterior cervical diskectomy, c5-c6 and c6-c7.  08/20/2007  . Cardiac catheterization    . Cardiac stents      2 stents   . Colonoscopy    . Cataracts removed      bilateral  . Right  lung biopsy     Social History:  reports that he quit smoking about 22 years ago. His smoking use included Cigars. He has never used smokeless tobacco. He reports that he does not drink alcohol or use illicit drugs.   Family History  Problem Relation Age of Onset  . Alcohol abuse Father   . Lung cancer Father   . Heart disease Mother      Allergies  Allergen Reactions  . Aricept [Donepezil Hcl]     Nausea  . Lipitor [Atorvastatin] Other (See Comments)    Joint and muscle pain  . Crestor [Rosuvastatin] Other (See Comments)    Joint pain      Prior to Admission medications   Medication Sig Start Date End Date Taking? Authorizing Provider  aspirin EC 81 MG tablet Take 81 mg by mouth daily.   Yes Historical Provider, MD  clonazePAM (KLONOPIN) 0.5 MG tablet Take 0.5 mg by mouth 2 (two) times daily as needed for anxiety.  12/24/11  Yes Shanker Kristeen Mans, MD  diphenhydrAMINE (BENADRYL) 25 MG tablet Take 25 mg by mouth every 6 (six) hours as needed for itching or allergies.    Yes Historical Provider, MD  HYDROcodone-acetaminophen (NORCO/VICODIN) 5-325 MG per tablet Take 1 tablet by mouth every 8 (eight) hours as needed for pain (take 1 or 2 tablets 3 times daily as needed ). 03/03/12  Yes Melrose Nakayama, MD  metoprolol tartrate (LOPRESSOR) 25 MG tablet Take 12.5 mg by mouth 2 (two) times daily.   Yes Historical Provider, MD  prasugrel (EFFIENT) 10 MG TABS tablet Take 10 mg by mouth daily.   Yes Historical Provider, MD  VIIBRYD 40 MG TABS Take 40 mg by mouth.  08/22/12  Yes Historical Provider, MD  nitroGLYCERIN (NITROSTAT) 0.4 MG SL tablet Place 1 tablet (0.4 mg total) under the tongue every 5 (five) minutes as needed for chest pain. 03/26/13   Josue Hector, MD    Review of Systems:  Constitutional:  No weight loss, night sweats, Fevers, chills, fatigue.  Head&Eyes: No headache.  No vision loss.  No eye pain or scotoma ENT:  No Difficulty swallowing,Tooth/dental  problems,Sore throat,   Cardio-vascular:  No chest pain, Orthopnea, PND, swelling in lower extremities,  dizziness, palpitations  GI:  No  abdominal pain, nausea, vomiting, diarrhea, loss of appetite, hematochezia, melena, heartburn, indigestion, Resp:  No shortness of breath with exertion or at rest. No cough. No coughing up of blood .No wheezing.No chest wall deformity  Skin:  no rash or lesions.  GU:  no dysuria, change in color of urine, no urgency or frequency. No flank pain.  Musculoskeletal:  No joint pain or swelling. No decreased range of motion.  Psych:  No change in mood or affect Neurologic: No headache, no dysesthesia, no focal weakness, no vision  loss. No syncope  Physical Exam: Filed Vitals:   10/16/13 1330 10/16/13 1407 10/16/13 1430 10/16/13 1500  BP: 137/75  149/83 150/90  Pulse: 67  66 66  Temp:  98.1 F (36.7 C)    TempSrc:      Resp: 20  16 15   SpO2: 96%  97% 96%   General:  A&O x 3, NAD, nontoxic, pleasant/cooperative Head/Eye: No conjunctival hemorrhage, no icterus, Gideon/AT, No nystagmus ENT:  No icterus,  No thrush, good dentition, no pharyngeal exudate Neck:  No masses, no lymphadenpathy, no bruits CV:  RRR, no rub, no gallop, no S3 Lung:  CTAB, good air movement, no wheeze, no rhonchi Abdomen: soft/NT, +BS, nondistended, no peritoneal signs Ext: No cyanosis, No rashes, No petechiae, No lymphangitis, No edema Neuro: CNII-XII intact, strength 4/5 in bilateral upper and lower extremities, no dysmetria  Labs on Admission:  Basic Metabolic Panel:  Recent Labs Lab 10/16/13 1400 10/16/13 1411  NA 139 140  K 3.8 3.6*  CL 100 104  CO2 23  --   GLUCOSE 89 91  BUN 13 12  CREATININE 0.92 1.00  CALCIUM 9.0  --    Liver Function Tests:  Recent Labs Lab 10/16/13 1400  AST 17  ALT 12  ALKPHOS 75  BILITOT 0.7  PROT 8.3  ALBUMIN 3.5   No results found for this basename: LIPASE, AMYLASE,  in the last 168 hours No results found for this  basename: AMMONIA,  in the last 168 hours CBC:  Recent Labs Lab 10/16/13 1400 10/16/13 1411  WBC 9.2  --   NEUTROABS 4.7  --   HGB 14.9 16.3  HCT 44.1 48.0  MCV 93.8  --   PLT 235  --    Cardiac Enzymes: No results found for this basename: CKTOTAL, CKMB, CKMBINDEX, TROPONINI,  in the last 168 hours BNP: No components found with this basename: POCBNP,  CBG: No results found for this basename: GLUCAP,  in the last 168 hours  Radiological Exams on Admission: Ct Head Wo Contrast  10/16/2013   CLINICAL DATA:  Weakness, altered mental status  EXAM: CT HEAD WITHOUT CONTRAST  TECHNIQUE: Contiguous axial images were obtained from the base of the skull through the vertex without intravenous contrast.  COMPARISON:  Head CT - 12/19/2011; 12/16/2011; brain MRI - 10/02/2013  FINDINGS: Grossly unchanged findings of centralized volume loss with commensurate ex vacuo dilatation of the ventricular system. Scattered periventricular hypodensities compatible microvascular ischemic disease. The gray-white differentiation is otherwise well maintained. No CT evidence of acute large territory infarct. No intraparenchymal or extra-axial mass or hemorrhage. Unchanged size and configuration of the ventricles and basilar cisterns. No midline shift. Intracranial atherosclerosis. Limited visualization of the paranasal sinuses and mastoid air cells are normal. No air-fluid levels. Regional soft tissues are normal. Post bilateral cataract surgery. No displaced calvarial fracture.  IMPRESSION: Grossly unchanged findings of centralized atrophy and microvascular ischemic disease without acute intracranial process.   Electronically Signed   By: Sandi Mariscal M.D.   On: 10/16/2013 13:57    EKG: Independently reviewed. Sinus, no ST-T changes    Time spent:60 minutes Code Status:   FULL Family Communication:   Wife at bedside   Aseel Truxillo, DO  [facility] Pager [phone]  If 7PM-7AM, please contact  night-coverage [email or web address] Password [email or web address] 10/16/2013, 4:52 PM

## 2013-10-17 ENCOUNTER — Inpatient Hospital Stay (HOSPITAL_COMMUNITY): Payer: Medicare Other

## 2013-10-17 DIAGNOSIS — G40209 Localization-related (focal) (partial) symptomatic epilepsy and epileptic syndromes with complex partial seizures, not intractable, without status epilepticus: Secondary | ICD-10-CM

## 2013-10-17 DIAGNOSIS — I369 Nonrheumatic tricuspid valve disorder, unspecified: Secondary | ICD-10-CM

## 2013-10-17 LAB — HEMOGLOBIN A1C
Hgb A1c MFr Bld: 5.4 % (ref <5.7)
Mean Plasma Glucose: 108 mg/dL (ref <117)

## 2013-10-17 LAB — LIPID PANEL
CHOLESTEROL: 194 mg/dL (ref 0–200)
HDL: 35 mg/dL — ABNORMAL LOW (ref >39)
LDL Cholesterol: 122 mg/dL — ABNORMAL HIGH (ref 0–99)
Total CHOL/HDL Ratio: 5.5 RATIO
Triglycerides: 187 mg/dL — ABNORMAL HIGH (ref <150)
VLDL: 37 mg/dL (ref 0–40)

## 2013-10-17 MED ORDER — LEVETIRACETAM 500 MG PO TABS: 500.0000 mg | ORAL_TABLET | Freq: Two times a day (BID) | ORAL | Status: DC

## 2013-10-17 MED ORDER — LEVETIRACETAM 500 MG PO TABS
500.0000 mg | ORAL_TABLET | Freq: Once | ORAL | Status: AC
  Administered 2013-10-17: 500 mg via ORAL
  Filled 2013-10-17: qty 1

## 2013-10-17 NOTE — Discharge Summary (Signed)
Physician Discharge Summary  Tyler Mayo ZOX:[id number] DOB: July 11, 1940 DOA: 10/16/2013  PCP: Anthoney Harada, MD  Admit date: 10/16/2013 Discharge date: 10/17/2013  Recommendations for Outpatient Follow-up:  1. Followup with primary neurologist, Dr. Jannifer Franklin in one to 2 weeks for EEG and ongoing management of Keppra 2. PCP in one to 2 weeks, Followup echocardiogram results and hemoglobin A1c.  Recommend decreasing or stopping vilazodone since this can increase the risk of seizures.    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 The patient has underlying dementia and is unable to recall the events, however his wife was present and stated that he had a "glazed look" in his eyes and was staring aimlessly.  In the emergency department, his CT brain was negative for acute findings. His urine drug screen was positive for benzodiazepines and opiates as expected given his home medications. No evidence of pneumonia or urinary tract infection.  Hospital Course:   Altered mental status likely secondary to seizure. He was seen by neurology and underwent MRI/MRA of the brain which demonstrated no acute strokes or hemorrhage, atrophy and small vessel disease, and no changes to minor nonstenotic irregularities of multiple cerebral arteries compared to previous MRA.  Telemetry demonstrated NSR with intermittent sinus tachycardia.   Troponin was negative and ECG stable.  Given the fact that he was not totally unresponsive during his episode makes arrhythmia somewhat less likely.  His echocardiogram has been completed but results are pending. Neurology felt that most likely his symptoms were due to a partial complex seizure given the description of a prolonged post ictal state and he was started on IV Keppra.  They recommended outpatient EEG and keppra 500mg  BID until he is able to follow up with his primary neurologist.  Advised his PCP to reduce his vilazodone and transition to alternative which does not increase risk of seizures.    Coronary artery disease with history of MI, no evidence series arrhythmia telemetry. He continued aspirin and effient.  He has been intolerant to statin medications. Recommend he followup with his primary care doctor or cardiologist for further management of his hyperlipidemia. His HDL was 35 and his LDL 122, well above goal for his history of coronary artery disease. Hemoglobin A1c is pending.  Carotid stenosis, history of carotid stents x2.  This is felt not to be a TIA given the description of his symptoms per neurology, and so carotid duplex was not completed at this time and he should have routine followup for carotid stent stenosis as previously scheduled.  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  Routine follow up.    Depression, stable, continued vilazodone.   Procedure   CT head  Chest x-ray  MRI brain/MRA head  Consultations:   Neurology  Discharge Exam: Filed Vitals:   10/17/13 1001  BP: 136/89  Pulse: 89  Temp:   Resp:    Filed Vitals:   10/16/13 2305 10/17/13 0000 10/17/13 0400 10/17/13 1001  BP: 180/92 144/84 133/75 136/89  Pulse: 102 97 92 89  Temp:      TempSrc:      Resp:      Height:      Weight:      SpO2:        General: CM, NAD HEENT:  NCAT, MMM Cardiovascular: RRR, no mrg, 2+ pulses Respiratory: CTAB, no increased WOB ABD:  NABS, soft,  NT/ND MSK:  Normal tone and bulk, no LEE Neuro:  CN II-XII grossly intact, strength 5/5, sensation intact to light touch.    Discharge Instructions      Discharge Instructions   Call MD for:  difficulty breathing, headache or visual disturbances    Complete by:  As directed      Call MD for:  extreme fatigue    Complete by:  As directed      Call MD for:  hives    Complete by:  As directed      Call MD for:  persistant dizziness or light-headedness    Complete by:  As directed      Call MD for:  persistant nausea and vomiting    Complete by:  As directed      Call MD for:  severe uncontrolled pain    Complete by:  As directed      Call MD for:  temperature >100.4    Complete by:  As directed      Diet - low sodium heart healthy    Complete by:  As directed      Discharge instructions    Complete by:  As directed   You were hospitalized with an episode of what sounded like seizure.  You did not have a stroke.  You were started on Keppra to prevent seizures and should follow up with your Neurologist, Dr. Jannifer Franklin, in 2 weeks to have further testing done and to make sure the [place] is working.  Please also follow up with your primary care doctor to talk about your viibryd which is known to increase seizure risk.  This medication may need to be reduced and transitioned to an alternative medication.  Please continue your other home medications.  Your primary care doctor will need to follow up on the final results of the ultrasound of your heart and some blood work which are pending.  If you have another episode, please call Dr. Tobey Grim office right away.  If he turns blue, stops breathing, or has seizure activity for more than 2 minutes, please call 911 right away.     Driving Restrictions    Complete by:  As directed   No driving, operating heavy machinery, bathing unsupervised, or climbing.  No babysitting.     Increase activity slowly    Complete by:  As directed              Medication List         aspirin EC 81 MG tablet  Take 81 mg by mouth daily.     clonazePAM 0.5 MG tablet  Commonly known as:  KLONOPIN  Take 0.5 mg by mouth 2 (two) times daily as needed for anxiety.     diphenhydrAMINE 25 MG tablet  Commonly known as:  BENADRYL  Take 25 mg by mouth every 6 (six) hours as needed for itching or allergies.     HYDROcodone-acetaminophen 5-325 MG per tablet  Commonly known as:  NORCO/VICODIN  Take 1 tablet by mouth every 8 (eight) hours as needed  for pain (take 1 or 2 tablets 3 times daily as needed ).     levETIRAcetam 500 MG tablet  Commonly known as:  KEPPRA  Take 1 tablet (500 mg total) by mouth 2 (two) times daily.     metoprolol tartrate 25 MG tablet  Commonly known as:  LOPRESSOR  Take 12.5 mg by mouth 2 (two) times daily.     nitroGLYCERIN 0.4 MG SL tablet  Commonly known as:  NITROSTAT  Place 1 tablet (0.4 mg total) under the tongue every 5 (five) minutes as needed for chest pain.     prasugrel 10 MG Tabs tablet  Commonly known as:  EFFIENT  Take 10 mg by mouth daily.     VIIBRYD 40 MG Tabs  Generic drug:  Vilazodone HCl  Take 40 mg by mouth.       Follow-up Information   Follow up with Anthoney Harada, MD. Schedule an appointment as soon as possible for a visit in 2 weeks.   Specialty:  Family Medicine   Contact information:   [place] [address] [phone]       Follow up with Lenor Coffin, MD. Schedule an appointment as soon as possible for a visit in 2 weeks.   Specialty:  Neurology   Contact information:   [address] [place] [address] [phone]        The results of significant diagnostics from this hospitalization (including imaging, microbiology, ancillary and laboratory) are listed below for reference.    Significant Diagnostic Studies: Dg Chest 2 View  10/17/2013   CLINICAL DATA:  Admitted for stroke.  EXAM: CHEST  2 VIEW  COMPARISON:  01/15/2012   FINDINGS: Shallow inspiration. Heart size and pulmonary vascularity are normal for technique. Lucencies in the upper lungs likely due to emphysema. Postoperative changes in the right base. No focal airspace disease or consolidation. No blunting of costophrenic angles. Postoperative changes in the cervical spine. No significant change since previous study.  IMPRESSION: No active cardiopulmonary disease.   Electronically Signed   By: Lucienne Capers M.D.   On: 10/17/2013 02:09   Ct Head Wo Contrast  10/16/2013   CLINICAL DATA:  Weakness, altered mental status  EXAM: CT HEAD WITHOUT CONTRAST  TECHNIQUE: Contiguous axial images were obtained from the base of the skull through the vertex without intravenous contrast.  COMPARISON:  Head CT - 12/19/2011; 12/16/2011; brain MRI - 10/02/2013  FINDINGS: Grossly unchanged findings of centralized volume loss with commensurate ex vacuo dilatation of the ventricular system. Scattered periventricular hypodensities compatible microvascular ischemic disease. The gray-white differentiation is otherwise well maintained. No CT evidence of acute large territory infarct. No intraparenchymal or extra-axial mass or hemorrhage. Unchanged size and configuration of the ventricles and basilar cisterns. No midline shift. Intracranial atherosclerosis. Limited visualization of the paranasal sinuses and mastoid air cells are normal. No air-fluid levels. Regional soft tissues are normal. Post bilateral cataract surgery. No displaced calvarial fracture.  IMPRESSION: Grossly unchanged findings of centralized atrophy and microvascular ischemic disease without acute intracranial process.   Electronically Signed   By: Sandi Mariscal M.D.   On: 10/16/2013 13:57   Mr Brain Wo Contrast  10/17/2013   CLINICAL DATA:  Confusion with agitation.  Speech difficulty.  EXAM: MRI HEAD WITHOUT CONTRAST  MRA HEAD WITHOUT CONTRAST  TECHNIQUE: Multiplanar, multiecho pulse sequences of the brain and surrounding  structures were obtained without intravenous contrast. Angiographic images of the head were obtained using MRA technique without  contrast.  COMPARISON:  CT head 10/16/2013. MR head 10/02/2013. Also MR head 12/19/2011.  FINDINGS: MRI HEAD FINDINGS  The patient was unable to remain motionless for the exam. Small or subtle lesions could be overlooked.  No evidence for acute infarction, hemorrhage, mass lesion, or extra-axial fluid. Generalized cerebral and cerebellar atrophy, with moderately advanced subcortical and periventricular white matter signal abnormality suggesting chronic microvascular ischemic change. Flow voids are maintained. No chronic hemorrhage. No definite midline abnormality. Extracranial soft tissues unremarkable.  MRA HEAD FINDINGS  There is no proximal stenosis or flow reducing lesion of the internal carotid arteries or basilar artery. Both vertebral arteries contribute to formation of the basilar with the left dominant. Minor non stenotic irregularity of the anterior, middle, and posterior cerebral arteries without proximal flow reducing lesion. No intracranial aneurysm. Cerebellar branches are poorly seen.  IMPRESSION: Atrophy and small vessel disease. No acute intracranial findings. Similar appearance to prior MR.  Unremarkable intracranial MRA without proximal flow reducing lesion.   Electronically Signed   By: Rolla Flatten M.D.   On: 10/17/2013 11:04   Mr Brain Wo Contrast  10/05/2013   [facility]  NEUROIMAGING REPORT   STUDY DATE: 10/02/13 PATIENT NAME: BASHIR MARCHETTI DOB: June 24, 1940 MRN: [id number]  ORDERING CLINICIAN: Kathrynn Ducking, MD  CLINICAL HISTORY: 73 year old male with memory loss.  EXAM: MRI brain (without)  TECHNIQUE: MRI of the brain without contrast was obtained utilizing 5 mm  axial slices with T1, T2, T2 flair, SWI and diffusion weighted views.  T1  sagittal and T2 coronal views were obtained. CONTRAST: no IMAGING SITE: [facility] [address] (1.5 Tesla MRI)    FINDINGS:  No abnormal lesions are seen on diffusion-weighted views to suggest acute  ischemia. The cortical sulci, fissures and cisterns are notable for mild  perisylvian and moderate mesial temporal atrophy. Lateral, third and  fourth ventricle are notable for mild-moderate ventriculomegaly. No  extra-axial fluid collections are seen. No evidence of mass effect or  midline shift. Few scattered non-specific foci of periventricular and  subcortical gliosis.  On sagittal views the posterior fossa, pituitary gland and corpus callosum  are notable for moderate corpus callosal atrophy. No evidence of  intracranial hemorrhage on SWI views. The orbits and their contents,  paranasal sinuses and calvarium are notable for post-surgical orbits.   Intracranial flow voids are present.    10/05/2013   Abnormal MRI brain (without) demonstrating: 1. Mild perisylvian and moderate mesial temporal atrophy. Mild-moderate  ventriculomegaly on ex vacuo basis. 2. Few scattered non-specific foci of periventricular and subcortical  gliosis. 3. No acute findings. 4. Mild progression of atrophy compared to MRI on 12/19/11.    INTERPRETING PHYSICIAN:  Penni Bombard, MD Certified in Neurology, Neurophysiology and Neuroimaging  [facility] [address] [place], [place] [address] [phone]   Mr Jodene Nam Head/brain Wo Cm  10/17/2013   CLINICAL DATA:  Confusion with agitation.  Speech difficulty.  EXAM: MRI HEAD WITHOUT CONTRAST  MRA HEAD WITHOUT CONTRAST  TECHNIQUE: Multiplanar, multiecho pulse sequences of the brain and surrounding structures were obtained without intravenous contrast. Angiographic images of the head were obtained using MRA technique without contrast.  COMPARISON:  CT head 10/16/2013. MR head 10/02/2013. Also MR head 12/19/2011.  FINDINGS: MRI HEAD FINDINGS  The patient was unable to remain motionless for the exam. Small or subtle lesions could be overlooked.   No evidence for acute infarction, hemorrhage, mass lesion, or extra-axial fluid.  Generalized cerebral and cerebellar atrophy, with moderately advanced subcortical and periventricular white matter signal abnormality suggesting chronic microvascular ischemic change. Flow voids are maintained. No chronic hemorrhage. No definite midline abnormality. Extracranial soft tissues unremarkable.  MRA HEAD FINDINGS  There is no proximal stenosis or flow reducing lesion of the internal carotid arteries or basilar artery. Both vertebral arteries contribute to formation of the basilar with the left dominant. Minor non stenotic irregularity of the anterior, middle, and posterior cerebral arteries without proximal flow reducing lesion. No intracranial aneurysm. Cerebellar branches are poorly seen.  IMPRESSION: Atrophy and small vessel disease. No acute intracranial findings. Similar appearance to prior MR.  Unremarkable intracranial MRA without proximal flow reducing lesion.   Electronically Signed   By: Rolla Flatten M.D.   On: 10/17/2013 11:04    Microbiology: No results found for this or any previous visit (from the past 240 hour(s)).   Labs: Basic Metabolic Panel:  Recent Labs Lab 10/16/13 1400 10/16/13 1411  NA 139 140  K 3.8 3.6*  CL 100 104  CO2 23  --   GLUCOSE 89 91  BUN 13 12  CREATININE 0.92 1.00  CALCIUM 9.0  --    Liver Function Tests:  Recent Labs Lab 10/16/13 1400  AST 17  ALT 12  ALKPHOS 75  BILITOT 0.7  PROT 8.3  ALBUMIN 3.5   No results found for this basename: LIPASE, AMYLASE,  in the last 168 hours No results found for this basename: AMMONIA,  in the last 168 hours CBC:  Recent Labs Lab 10/16/13 1400 10/16/13 1411  WBC 9.2  --   NEUTROABS 4.7  --   HGB 14.9 16.3  HCT 44.1 48.0  MCV 93.8  --   PLT 235  --    Cardiac Enzymes: No results found for this basename: CKTOTAL, CKMB, CKMBINDEX, TROPONINI,  in the last 168 hours BNP: BNP (last 3 results) No results found  for this basename: PROBNP,  in the last 8760 hours CBG: No results found for this basename: GLUCAP,  in the last 168 hours  Time coordinating discharge: 45 minutes  Signed:  Maite Burlison  [facility] 10/17/2013, 11:59 AM

## 2013-10-17 NOTE — Progress Notes (Signed)
  Echocardiogram 2D Echocardiogram has been performed.  Inola Lisle FRANCES 10/17/2013, 10:49 AM

## 2013-10-17 NOTE — Progress Notes (Signed)
Subjective: After discussing with wife, he has had two episodes of transient confusion in the past, one of which resulted in a prolonged amnestic period.   Exam: Filed Vitals:   10/17/13 1001  BP: 136/89  Pulse: 89  Temp:   Resp:    Gen: In bed, NAD MS: Awake, alert, interactive and appropriate.  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 The description sounds very characteristic of a partial complex seizure and I feel that witht he previous episodes and probable dementia I would favor starting an AED at this time.   I feel that TIA with this semiology would be  Unlikely and I do not think workup for this needs to be continued.    Recommendations: 1) Keppra 500mg  BID 2) F/u with Dr. Jannifer Franklin as an outpatient.   Roland Rack, MD [facility] [phone]  If 7pm- 7am, please page neurology on call as listed in [place].

## 2013-10-17 NOTE — Plan of Care (Signed)
Problem: Discharge/Transitional Outcomes Goal: Educational Plan Complete Outcome: Completed/Met Date Met:  10/17/13 Wife and pt educated on sz precautions, importance of MD f/u, and taking medication properly

## 2013-10-18 NOTE — Progress Notes (Signed)
Utilization Review Completed.Donne Anon T7/03/2014

## 2013-10-19 ENCOUNTER — Telehealth: Payer: Self-pay | Admitting: Neurology

## 2013-10-19 ENCOUNTER — Ambulatory Visit (INDEPENDENT_AMBULATORY_CARE_PROVIDER_SITE_OTHER): Payer: Medicare Other | Admitting: Neurology

## 2013-10-19 ENCOUNTER — Encounter: Payer: Self-pay | Admitting: Neurology

## 2013-10-19 VITALS — BP 148/81 | HR 84 | Wt 233.0 lb

## 2013-10-19 DIAGNOSIS — R4182 Altered mental status, unspecified: Secondary | ICD-10-CM

## 2013-10-19 DIAGNOSIS — R413 Other amnesia: Secondary | ICD-10-CM

## 2013-10-19 NOTE — Progress Notes (Signed)
Reason for visit: Seizure event  Tyler Mayo is an 73 y.o. male  History of present illness:  Tyler Mayo is a 73 year old right-handed white male with a history of a progressive memory disorder. The patient was placed on Aricept, but he could not tolerate the medication secondary to nausea. He was taken off of the medication, and he was placed on an Exelon patch, but he never picked up the prescription. The patient indicates that he went to the hospital on 10/16/2013. Apparently, the patient came out of the bathroom, and appeared to be confused. The wife got him on the floor and had a wide-eyed stare, not responding properly. The wife called 39, and the patient was brought to the emergency room for an evaluation. The episode of altered mental status lasted about 20 minutes. The patient returned to his baseline. MRI evaluation and MRA of the head were relatively unremarkable. The patient was placed on Keppra for a presumed seizure taking 500 mg twice daily. The patient was on Viibryd, and the dose was cut back to 20 mg from 40 mg daily dose as there were concerns that this could lower the seizure threshold. He is sent to this office today for an evaluation.   Past Medical History  Diagnosis Date  . Lung nodules     s/p bx with Dr. 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 CAD (coronary artery disease)     a.  8.2012 neg MV;  b.  05/12/2011 Inf STEMI - 100% RCA - 3.5x22 Resolute DES, Residual 80% LAD dzs. c. s/p planned DES to LAD 06/01/11.  Marland Kitchen Hyperlipidemia     takes Crestor daily  . History of tobacco abuse   . Joint pain   . Chronic back pain   . Bruises easily     takes Effient daily  . Enlarged prostate   . Depression with anxiety     takes Vibryd daily and Clonazepam prn; dx with PTSD and sees Dr.McKinney  . Erectile dysfunction   . Insomnia     takes an OTC sleep aide  . Carotid stenosis     dopplers 9/13:  RICA 40-59%  . Memory deficit 09/17/2013     Past Surgical History  Procedure Laterality Date  . Tonsillectomy    . Adenoidectomy    . Ankle fracture surgery    . Cervical disc surgery      3 level fusion with Cadaver Simona Huh June 2009 successful.  No complications  . Fiberoptic bronchoscopy with endobronchial  09/23/2010  . L1 vertebroplasty and l1 core biopsy.  08/18/2010  . Right thigh abscess incision and drainage.  05/04/2010  . Right inguinal hernia repair with extended prolene hernia  12/24/2008  . Decompressive anterior cervical diskectomy, c5-c6 and c6-c7.  08/20/2007  . Cardiac catheterization    . Cardiac stents      2 stents   . Colonoscopy    . Cataracts removed      bilateral  . Right lung biopsy      Family History  Problem Relation Age of Onset  . Alcohol abuse Father   . Lung cancer Father   . Heart disease Mother     Social history:  reports that he quit smoking about 22 years ago. His smoking use included Cigars. He has never used smokeless tobacco. He reports that he does not drink alcohol or use illicit drugs.    Allergies  Allergen Reactions  . Ambien [  Zolpidem Tartrate] Other (See Comments)    Causes severe confusion   . Aricept [Donepezil Hcl]     Nausea  . Lipitor [Atorvastatin] Other (See Comments)    Joint and muscle pain  . Crestor [Rosuvastatin] Other (See Comments)    Joint pain    Medications:  Current Outpatient Prescriptions on File Prior to Visit  Medication Sig Dispense Refill  . aspirin EC 81 MG tablet Take 81 mg by mouth daily.      . clonazePAM (KLONOPIN) 0.5 MG tablet Take 0.5 mg by mouth 2 (two) times daily as needed for anxiety.       . diphenhydrAMINE (BENADRYL) 25 MG tablet Take 25 mg by mouth every 6 (six) hours as needed for itching or allergies.       Marland Kitchen HYDROcodone-acetaminophen (NORCO/VICODIN) 5-325 MG per tablet Take 1 tablet by mouth every 8 (eight) hours as needed for pain (take 1 or 2 tablets 3 times daily as needed ).  40 tablet  0  . levETIRAcetam  (KEPPRA) 500 MG tablet Take 1 tablet (500 mg total) by mouth 2 (two) times daily.  60 tablet  0  . metoprolol tartrate (LOPRESSOR) 25 MG tablet Take 12.5 mg by mouth 2 (two) times daily.      . nitroGLYCERIN (NITROSTAT) 0.4 MG SL tablet Place 1 tablet (0.4 mg total) under the tongue every 5 (five) minutes as needed for chest pain.  25 tablet  4  . prasugrel (EFFIENT) 10 MG TABS tablet Take 10 mg by mouth daily.      Marland Kitchen VIIBRYD 40 MG TABS Take 20 mg by mouth.        No current facility-administered medications on file prior to visit.    ROS:  Out of a complete 14 system review of symptoms, the patient complains only of the following symptoms, and all other reviewed systems are negative.  Back pain  Memory loss, headache, seizures, tremors  Blood pressure 148/81, pulse 84, weight 233 lb (105.688 kg).  Physical Exam  General: The patient is alert and cooperative at the time of the examination.  Skin: No significant peripheral edema is noted.   Neurologic Exam  Mental status: The Mini-Mental status examination done today shows a total score of 19/30.  Cranial nerves: Facial symmetry is present. Speech is normal, no aphasia or dysarthria is noted. Extraocular movements are full. Visual fields are full.  Motor: The patient has good strength in all 4 extremities.  Sensory examination: Soft touch sensation is symmetric on the face, arms, and legs.  Coordination: The patient has good finger-nose-finger and heel-to-shin bilaterally.  Gait and station: The patient has a normal gait. Tandem gait is normal. Romberg is negative. No drift is seen.  Reflexes: Deep tendon reflexes are symmetric.    MRI brain and MRA head 10/17/13:  IMPRESSION:  Atrophy and small vessel disease. No acute intracranial findings.  Similar appearance to prior MR.  Unremarkable intracranial MRA without proximal flow reducing lesion.    Assessment/Plan:  One. Memory disturbance  2. Muscle seizure  event  The patient does not operate a motor vehicle. He could have had a seizure-type event on 10/16/2013. The wife indicates that she has never seen similar events previously. The patient will be set up for an EEG study. If this study is unremarkable, it is possible that the patient could be tapered off of Keppra, as the above event was the first such witnessed episode. The patient will followup in about 4 months.  Jill Alexanders MD 10/19/2013 7:26 PM  [facility] [address] [place],  [address]  Phone [phone] Fax [phone]

## 2013-10-19 NOTE — Telephone Encounter (Signed)
Patient has been scheduled for today at 4:00

## 2013-10-19 NOTE — Telephone Encounter (Signed)
Patient's wife calling to state that patient was in the hospital for a seizure and per discharge papers needs to schedule a follow up appointment soon with Dr. Jannifer Franklin as well as schedule an EEG. Pease return call to patient and advise.

## 2013-10-22 ENCOUNTER — Telehealth: Payer: Self-pay | Admitting: Neurology

## 2013-10-22 ENCOUNTER — Ambulatory Visit (INDEPENDENT_AMBULATORY_CARE_PROVIDER_SITE_OTHER): Payer: Medicare Other

## 2013-10-22 ENCOUNTER — Ambulatory Visit: Payer: Self-pay | Admitting: Neurology

## 2013-10-22 DIAGNOSIS — R4182 Altered mental status, unspecified: Secondary | ICD-10-CM

## 2013-10-22 DIAGNOSIS — R413 Other amnesia: Secondary | ICD-10-CM

## 2013-10-22 NOTE — Procedures (Signed)
    History:   Tyler Mayo is a 73 year old gentleman with a history of a memory disturbance and had an event of confusion, staring off that occurred on 10/16/2013. The patient went to the emergency room, and he was placed on Keppra. The patient is being evaluated for possible seizures.  This is a routine EEG. No skull defects are noted. Medications include aspirin, clonazepam, Benadryl, hydrocodone, Keppra, metoprolol, nitroglycerin, Effient, and Viibryd.  EEG classification: Normal awake  Description of the recording: The background rhythms of this recording consists of a fairly well modulated medium amplitude alpha rhythm of 8 Hz that is reactive to eye opening and closure. As the record progresses, the patient appears to remain in the waking state throughout the recording. Photic stimulation was performed, resulting in a bilateral and symmetric photic driving response. Hyperventilation was also performed, resulting in a minimal buildup of the background rhythm activities without significant slowing seen. At no time during the recording does there appear to be evidence of spike or spike wave discharges or evidence of focal slowing. EKG monitor shows no evidence of cardiac rhythm abnormalities with a heart rate of 72.  Impression: This is a normal EEG recording in the waking state. No evidence of ictal or interictal discharges are seen.

## 2013-10-22 NOTE — Telephone Encounter (Signed)
The EEG done today was normal. I discussed the results with the patient and with the wife. We will plan on keeping him on Keppra for another several months, and he does well, we may consider a taper off of the medication.

## 2013-10-22 NOTE — Progress Notes (Signed)
Tyler Mayo is a 73 year old gentleman with a history of a memory disturbance. He had a confusional event with staring off on 10/16/2013. He was placed on Keppra after he visited the emergency room. He comes in today for an EEG study.  EEG study done today was within normal limits.  The patient has had a single episode of staring. He does not operate a motor vehicle. We will have a followup in 4 months. If he continues to do well, we will consider a taper off of Keppra. If the episodes recur, he will go back on the medication.

## 2013-10-30 ENCOUNTER — Encounter: Payer: Self-pay | Admitting: Cardiovascular Disease

## 2013-10-30 ENCOUNTER — Ambulatory Visit (INDEPENDENT_AMBULATORY_CARE_PROVIDER_SITE_OTHER): Payer: Medicare Other | Admitting: Cardiovascular Disease

## 2013-10-30 VITALS — BP 138/82 | HR 75 | Ht 70.0 in | Wt 230.0 lb

## 2013-10-30 DIAGNOSIS — G40209 Localization-related (focal) (partial) symptomatic epilepsy and epileptic syndromes with complex partial seizures, not intractable, without status epilepticus: Secondary | ICD-10-CM

## 2013-10-30 DIAGNOSIS — E785 Hyperlipidemia, unspecified: Secondary | ICD-10-CM

## 2013-10-30 DIAGNOSIS — I2510 Atherosclerotic heart disease of native coronary artery without angina pectoris: Secondary | ICD-10-CM

## 2013-10-30 DIAGNOSIS — I2584 Coronary atherosclerosis due to calcified coronary lesion: Secondary | ICD-10-CM

## 2013-10-30 NOTE — Assessment & Plan Note (Signed)
F/U Dr Jannifer Franklin Continue Keppra

## 2013-10-30 NOTE — Assessment & Plan Note (Signed)
Cholesterol is at goal.  Continue current dose of statin and diet Rx.  No myalgias or side effects.  F/U  LFT's in 6 months. Lab Results  Component Value Date   LDLCALC 122* 10/17/2013

## 2013-10-30 NOTE — Assessment & Plan Note (Signed)
Stable with no angina and good activity level.  Continue medical Rx  

## 2013-10-30 NOTE — Patient Instructions (Signed)
Your physician wants you to follow-up in:  6 MONTHS WITH DR NISHAN  You will receive a reminder letter in the mail two months in advance. If you don't receive a letter, please call our office to schedule the follow-up appointment. Your physician recommends that you continue on your current medications as directed. Please refer to the Current Medication list given to you today. 

## 2013-10-30 NOTE — Progress Notes (Signed)
Patient ID: Tyler Mayo, male   DOB: 22-Jul-1940, 73 y.o.   MRN: [id number] He has a history of CAD, status post inferior STEMI 2/13 treated with a DES to the RCA. He had staged intervention with a DES to the mid LAD as well. He's been followed for lung nodules by Dr. Arlyce Dice. He ultimately underwent wedge resection in 8/13. According to the notes, this was diagnosed as a low-grade lymphoma.  Has had issues with ? Seizures for years.  In ER recently with some imaginativeness and confusion  EEG negative has f/u with Dr Jannifer Franklin now on [place]  Frustrated by issues with memory    ROS: Denies fever, malais, weight loss, blurry vision, decreased visual acuity, cough, sputum, SOB, hemoptysis, pleuritic pain, palpitaitons, heartburn, abdominal pain, melena, lower extremity edema, claudication, or rash.  All other systems reviewed and negative  General: Affect appropriate Chronically ill white male  HEENT: normal Neck supple with no adenopathy JVP normal no bruits no thyromegaly Lungs clear with no wheezing and good diaphragmatic motion Heart:  S1/S2 no murmur, no rub, gallop or click PMI normal Abdomen: benighn, BS positve, no tenderness, no AAA no bruit.  No HSM or HJR Distal pulses intact with no bruits No edema Neuro non-focal Skin warm and dry No muscular weakness   Current Outpatient Prescriptions  Medication Sig Dispense Refill  . aspirin EC 81 MG tablet Take 81 mg by mouth daily.      . clonazePAM (KLONOPIN) 0.5 MG tablet Take 0.5 mg by mouth 2 (two) times daily as needed for anxiety.       . diphenhydrAMINE (BENADRYL) 25 MG tablet Take 25 mg by mouth every 6 (six) hours as needed for itching or allergies.       Marland Kitchen HYDROcodone-acetaminophen (NORCO/VICODIN) 5-325 MG per tablet Take 1 tablet by mouth every 8 (eight) hours as needed for pain (take 1 or 2 tablets 3 times daily as needed ).  40 tablet  0  . levETIRAcetam (KEPPRA) 500 MG tablet Take 1 tablet (500 mg total) by mouth 2 (two)  times daily.  60 tablet  0  . metoprolol tartrate (LOPRESSOR) 25 MG tablet Take 12.5 mg by mouth 2 (two) times daily.      . nitroGLYCERIN (NITROSTAT) 0.4 MG SL tablet Place 1 tablet (0.4 mg total) under the tongue every 5 (five) minutes as needed for chest pain.  25 tablet  4  . prasugrel (EFFIENT) 10 MG TABS tablet Take 10 mg by mouth daily.      Marland Kitchen VIIBRYD 40 MG TABS Take 20 mg by mouth.        No current facility-administered medications for this visit.    Allergies  Ambien; Aricept; Lipitor; and Crestor  Electrocardiogram:  SR rate 47 old IMI   Assessment and Plan

## 2013-11-13 ENCOUNTER — Other Ambulatory Visit: Payer: Self-pay | Admitting: Cardiovascular Disease

## 2013-11-13 ENCOUNTER — Other Ambulatory Visit: Payer: Self-pay

## 2013-11-13 ENCOUNTER — Other Ambulatory Visit: Payer: Self-pay | Admitting: *Deleted

## 2013-11-13 MED ORDER — METOPROLOL TARTRATE 25 MG PO TABS: 12.5000 mg | ORAL_TABLET | Freq: Two times a day (BID) | ORAL | Status: DC

## 2013-11-16 ENCOUNTER — Telehealth: Payer: Self-pay | Admitting: Neurology

## 2013-11-16 MED ORDER — LEVETIRACETAM 500 MG PO TABS: 500.0000 mg | ORAL_TABLET | Freq: Two times a day (BID) | ORAL | Status: DC

## 2013-11-16 NOTE — Telephone Encounter (Signed)
Lenox Bink DOB 02/08/2041 wife is calling--patient needs a new Rx called in for [place] you.

## 2013-11-16 NOTE — Telephone Encounter (Signed)
Per phone note:  Kathrynn Ducking, MD at 10/22/2013 3:56 PM     Status: Signed        The EEG done today was normal. I discussed the results with the patient and with the wife. We will plan on keeping him on Keppra for another several months, and he does well, we may consider a taper off of the medication   Rx has been sent

## 2013-11-20 ENCOUNTER — Telehealth: Payer: Self-pay | Admitting: Neurology

## 2013-11-20 NOTE — Telephone Encounter (Signed)
Spouse called and stated patient has experienced trouble sleeping after taking levETIRAcetam (KEPPRA) 500 MG tablet.  Please call and advise.  Thanks

## 2013-11-20 NOTE — Telephone Encounter (Signed)
Change Keppra to 500 mg in AM and 250 mg at lunch and 250 mg in PM, thus lowering the night time dose. Spoke to spouse on phone.  CD

## 2013-11-20 NOTE — Telephone Encounter (Signed)
I called back.  Patient has been having trouble sleeping since he began taking Keppra last month, but seems to have gotten worse within the past week.  Levada Dy says he gets up at least 20 times a night.  They would like a message sent to MD for recommendation.  I asked if anything else at all had changed, she said no. Dr Jannifer Franklin is out of the office.  Forwarding request to [facility] for review.  Please advise.  Thank you.

## 2013-12-15 ENCOUNTER — Telehealth: Payer: Self-pay | Admitting: Oncology

## 2013-12-24 ENCOUNTER — Telehealth: Payer: Self-pay | Admitting: Cardiovascular Disease

## 2013-12-24 NOTE — Telephone Encounter (Signed)
Discussed with Elita Boone D. Per Gay Filler, we would prefer that the patient does not come off Effient. If able to do the "2 small extractions" then that is ok. Pt had gone in for cleaning originally and found to need further work. Will be having extractions today while there.

## 2013-12-24 NOTE — Telephone Encounter (Signed)
New message     Pt is on a blood thinner (effient and aspirin).  He needs dental extractions.  They need to know if he needs to stop his blood thinner prior to extractions.

## 2014-01-12 ENCOUNTER — Other Ambulatory Visit: Payer: Medicare Other

## 2014-02-09 ENCOUNTER — Ambulatory Visit: Payer: Medicare Other | Admitting: Oncology

## 2014-02-15 ENCOUNTER — Other Ambulatory Visit: Payer: Self-pay | Admitting: *Deleted

## 2014-02-15 DIAGNOSIS — D381 Neoplasm of uncertain behavior of trachea, bronchus and lung: Secondary | ICD-10-CM

## 2014-02-19 ENCOUNTER — Other Ambulatory Visit (HOSPITAL_BASED_OUTPATIENT_CLINIC_OR_DEPARTMENT_OTHER): Payer: Medicare Other

## 2014-02-19 DIAGNOSIS — R918 Other nonspecific abnormal finding of lung field: Secondary | ICD-10-CM

## 2014-02-19 DIAGNOSIS — D381 Neoplasm of uncertain behavior of trachea, bronchus and lung: Secondary | ICD-10-CM

## 2014-02-19 LAB — CBC WITH DIFFERENTIAL/PLATELET
BASO%: 0.4 % (ref 0.0–2.0)
BASOS ABS: 0 10*3/uL (ref 0.0–0.1)
EOS%: 4.8 % (ref 0.0–7.0)
Eosinophils Absolute: 0.4 10*3/uL (ref 0.0–0.5)
HCT: 47.1 % (ref 38.4–49.9)
HGB: 15.5 g/dL (ref 13.0–17.1)
LYMPH%: 40 % (ref 14.0–49.0)
MCH: 31.6 pg (ref 27.2–33.4)
MCHC: 32.9 g/dL (ref 32.0–36.0)
MCV: 95.8 fL (ref 79.3–98.0)
MONO#: 0.8 10*3/uL (ref 0.1–0.9)
MONO%: 10.5 % (ref 0.0–14.0)
NEUT#: 3.4 10*3/uL (ref 1.5–6.5)
NEUT%: 44.3 % (ref 39.0–75.0)
PLATELETS: 281 10*3/uL (ref 140–400)
RBC: 4.91 10*6/uL (ref 4.20–5.82)
RDW: 14.1 % (ref 11.0–14.6)
WBC: 7.6 10*3/uL (ref 4.0–10.3)
lymph#: 3 10*3/uL (ref 0.9–3.3)

## 2014-02-19 LAB — COMPREHENSIVE METABOLIC PANEL (CC13)
ALT: 25 U/L (ref 0–55)
AST: 35 U/L — ABNORMAL HIGH (ref 5–34)
Albumin: 3.8 g/dL (ref 3.5–5.0)
Alkaline Phosphatase: 76 U/L (ref 40–150)
Anion Gap: 6 mEq/L (ref 3–11)
BUN: 12.7 mg/dL (ref 7.0–26.0)
CALCIUM: 9.7 mg/dL (ref 8.4–10.4)
CHLORIDE: 103 meq/L (ref 98–109)
CO2: 27 meq/L (ref 22–29)
Creatinine: 1.1 mg/dL (ref 0.7–1.3)
Glucose: 104 mg/dl (ref 70–140)
Potassium: 4.2 mEq/L (ref 3.5–5.1)
SODIUM: 137 meq/L (ref 136–145)
TOTAL PROTEIN: 9.1 g/dL — AB (ref 6.4–8.3)
Total Bilirubin: 0.98 mg/dL (ref 0.20–1.20)

## 2014-02-21 ENCOUNTER — Other Ambulatory Visit: Payer: Self-pay | Admitting: Cardiovascular Disease

## 2014-02-22 ENCOUNTER — Other Ambulatory Visit: Payer: Medicare Other

## 2014-02-23 LAB — PROTEIN ELECTROPHORESIS, SERUM
Albumin ELP: 44.8 % — ABNORMAL LOW (ref 55.8–66.1)
Alpha-1-Globulin: 3.8 % (ref 2.9–4.9)
Alpha-2-Globulin: 11.7 % (ref 7.1–11.8)
BETA GLOBULIN: 6 % (ref 4.7–7.2)
Beta 2: 7.2 % — ABNORMAL HIGH (ref 3.2–6.5)
Gamma Globulin: 26.5 % — ABNORMAL HIGH (ref 11.1–18.8)
Total Protein, Serum Electrophoresis: 9.1 g/dL — ABNORMAL HIGH (ref 6.0–8.3)

## 2014-03-01 ENCOUNTER — Ambulatory Visit (HOSPITAL_BASED_OUTPATIENT_CLINIC_OR_DEPARTMENT_OTHER): Payer: Medicare Other | Admitting: Oncology

## 2014-03-01 ENCOUNTER — Telehealth: Payer: Self-pay | Admitting: Oncology

## 2014-03-01 VITALS — BP 146/73 | HR 83 | Temp 97.9°F | Resp 18 | Ht 70.0 in | Wt 228.5 lb

## 2014-03-01 DIAGNOSIS — R918 Other nonspecific abnormal finding of lung field: Secondary | ICD-10-CM

## 2014-03-01 DIAGNOSIS — F0390 Unspecified dementia without behavioral disturbance: Secondary | ICD-10-CM

## 2014-03-01 DIAGNOSIS — R911 Solitary pulmonary nodule: Secondary | ICD-10-CM

## 2014-03-01 NOTE — Progress Notes (Signed)
ID: Tyler Mayo   DOB: May 20, 1940  MR#: [id number]  CSN#:[id number]  PCP: Tyler Harada, MD SU: Tyler Finland, MD OTHER MD: Tyler Sewer, MD,  Tyler Rhodes, MD, Tyler Rouge, MD, Tyler Mayo M.D, Tyler Parkins MD   HISTORY OF PRESENT ILLNESS: From the original intake note:  Tyler Mayo had a chest x-ray preop a planned L1 vertebral plasty 05/24/2009. This showed a nodular density in the medial right lower lung. CT scan of the chest was obtained the next day, and showed a 2 ground glass opacities, one in the right middle lobe peripherally, a second one in the anterior medial left upper lobe. Both of were greater than 1 cm him and felt to be worrisome for low-grade adenocarcinoma. Consultation with thoracic surgery was obtained, and on 09/11/2010 the patient had a PET scan which showed the right middle lobe nodule, measuring 1.5 cm, to have an S. U V. max of 3.2. The left upper lobe nodule also had increased uptake, though the SUV max of 1.5 was borderline. This smaller nodule measured 1.2 cm.  Bronchoscopy with brushings and trans-bronchial biopsy 09/22/2010 showed only benign reactive epithelium (NZA [id number] and SZA [id number]). On 11/23/2010 a cardiac CT scan incidentally showed the right lung mass to have increased to 1.8 cm. Repeat chest CT 12/27/2010 showed the now slightly spiculated right lung nodule to measure 2.2 cm. The left upper lobe nodule measured 1.4 cm and had not changed as compared to CT scan of August 2012.  On 01/05/2011 the patient underwent CT-guided biopsy of the right lung nodule, showing ([id number]) a chronic inflammatory infiltrate with an abundance of plasma cells. Clonality by kappa/lambda stains could not be demonstrated. This was felt to most likely to be a reactive process. However, repeat CT of the chest 07/25/2011 showed the ground glass opacity in the right middle lobe to have increased further, now with a cavitary center. The lingular lesion hot also  increased slightly. Accordingly on 12/03/2011 the patient underwent video-assisted thoracoscopic wedge resection of the more worrisome right middle lobe nodule. The pathology [id number] and [id number]) showed an atypical lymphoplasmacytic infiltrate which was reviewed in [place] by Tyler Mayo. Based on her careful review, it was not possible to distinguish between nodular lymphoid hyperplasia (a very and of lymphoid interstitial pneumonia), which would be benign, and a low-grade MALT lymphoma with plasmacytic differentiation. In favor of the latter was their in situ hybridization study for kappa and lambda light chains, which showed a kappa: lambda ratio of greater than 10:1 (upper limit in reactive cases being 4 :1). In favor of a "reactive" interpretation were are flow cytometry studies, which showed no monoclonal B-cell population or abnormal T cell phenotype.   The patient's subsequent history is as detailed below.  INTERVAL HISTORY: Tyler Mayo returns today for followup of his lung mass and abnormal protein studies, accompanied by his wife Tyler Mayo. At the last visit here we placed a referral to a pain clinic and he is going to be seeing the pain management specialist and Tyler Mayo office next week. He also had a seizure episode which was evaluated by Tyler Mayo with an EEG. That was normal. Brain MRI was nonspecific, showing primarily atrophy. There have been no drenching sweats, unexplained fevers, severe unexplained fatigue, or any weight loss. He denies cough, phlegm production, pleurisy, or worsening shortness of breath.  REVIEW OF SYSTEMS: He continues to have significant pain in his legs and back. He is taking Norco for this, between  1 and 5 tablets a day. He denies constipation. Asked what he does all day, he says "I watch TV". Asked what his worse problem is, he says he wants to "get on with things". He admits to some forgetfulness, anxiety, and depression. He was started  on by [place] down recently. A detailed review of systems today was otherwise noncontributory  PAST MEDICAL HISTORY: Past Medical History  Diagnosis Date  . 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 CAD (coronary artery disease)     a.  8.2012 neg MV;  b.  05/12/2011 Inf STEMI - 100% RCA - 3.5x22 Resolute DES, Residual 80% LAD dzs. c. s/p planned DES to LAD 06/01/11.  Marland Kitchen Hyperlipidemia     takes Crestor daily  . History of tobacco abuse   . Joint pain   . Chronic back pain   . Bruises easily     takes Effient daily  . Enlarged prostate   . Depression with anxiety     takes Vibryd daily and Clonazepam prn; dx with PTSD and sees Tyler Mayo  . Erectile dysfunction   . Insomnia     takes an OTC sleep aide  . Carotid stenosis     dopplers 9/13:  RICA 40-59%  . Memory deficit 09/17/2013    PAST SURGICAL HISTORY: Past Surgical History  Procedure Laterality Date  . Tonsillectomy    . Adenoidectomy    . Ankle fracture surgery    . Cervical disc surgery      3 level fusion with Cadaver Simona Huh June 2009 successful.  No complications  . Fiberoptic bronchoscopy with endobronchial  09/23/2010  . L1 vertebroplasty and l1 core biopsy.  08/18/2010  . Right thigh abscess incision and drainage.  05/04/2010  . Right inguinal hernia repair with extended prolene hernia  12/24/2008  . Decompressive anterior cervical diskectomy, c5-c6 and c6-c7.  08/20/2007  . Cardiac catheterization    . Cardiac stents      2 stents   . Colonoscopy    . Cataracts removed      bilateral  . Right lung biopsy      FAMILY HISTORY Family History  Problem Relation Age of Onset  . Alcohol abuse Father   . Lung cancer Father   . Heart disease Mother   The patient's father died from lung cancer at the age of 44. He had a history of smoking. The patient's mother died at the age of 33. He was an only child.   SOCIAL HISTORY: The patient worked in the  arrangement of the [employer]. This involved quite a bit of traveling. His wife of 69 years, Tyler Mayo, formerly worked in an Futures trader. The patient has a daughter from an earlier marriage, Wednesday, who lives in [place] and worked for the [employer]. Tyler Mayo has 2 children from an earlier marriage, Keenan Bachelor and [place]ok. The patient is Catholic and  Tyler Mayo is [employer].   ADVANCED DIRECTIVES: Not on file  HEALTH MAINTENANCE: History  Substance Use Topics  . Smoking status: Former Smoker -- 0.50 packs/day for 25 years    Types: Cigars    Quit date: 04/10/1991  . Smokeless tobacco: Never Used  . Alcohol Use: No     Comment: ETOH free x 47yrs     Colonoscopy: 02/27/2008  PSA: 11/29/2011  Bone density: Not on file  Lipid panel: Not on file  Allergies  Allergen Reactions  . Ambien [Zolpidem Tartrate] Other (See Comments)    Causes severe confusion   . Aricept [Donepezil Hcl]     Nausea  . Lipitor [Atorvastatin] Other (See Comments)    Joint and muscle pain  . Crestor [Rosuvastatin] Other (See Comments)    Joint pain    Current Outpatient Prescriptions  Medication Sig Dispense Refill  . aspirin EC 81 MG tablet Take 81 mg by mouth daily.    . clonazePAM (KLONOPIN) 0.5 MG tablet Take 0.5 mg by mouth 2 (two) times daily as needed for anxiety.     . diphenhydrAMINE (BENADRYL) 25 MG tablet Take 25 mg by mouth every 6 (six) hours as needed for itching or allergies.     Marland Kitchen EFFIENT 10 MG TABS tablet TAKE 1 TABLET (10 MG TOTAL) BY MOUTH DAILY. 30 tablet 5  . HYDROcodone-acetaminophen (NORCO/VICODIN) 5-325 MG per tablet Take 1 tablet by mouth every 8 (eight) hours as needed for pain (take 1 or 2 tablets 3 times daily as needed ). 40 tablet 0  . levETIRAcetam (KEPPRA) 500 MG tablet Take 1 tablet (500 mg total) by mouth 2 (two) times daily. 60 tablet 3  . metoprolol tartrate (LOPRESSOR) 25 MG tablet Take 0.5 tablets (12.5 mg total) by mouth 2 (two) times daily. 30  tablet 5  . nitroGLYCERIN (NITROSTAT) 0.4 MG SL tablet Place 1 tablet (0.4 mg total) under the tongue every 5 (five) minutes as needed for chest pain. 25 tablet 4  . prasugrel (EFFIENT) 10 MG TABS tablet Take 10 mg by mouth daily.    Marland Kitchen VIIBRYD 40 MG TABS Take 20 mg by mouth.      No current facility-administered medications for this visit.    OBJECTIVE: Elderly white male who appears stated age  73 Vitals:   03/01/14 1527  BP: 146/73  Pulse: 83  Temp: 97.9 F (36.6 C)  Resp: 18     Body mass index is 32.79 kg/(m^2).    ECOG FS: 2  Sclerae unicteric, pupils round and equal Oropharynx clear and moist No cervical or supraclavicular adenopathy Lungs no rales or rhonchi Heart regular rate and rhythm Abd soft, nontender, positive bowel sounds Neuro: nonfocal; significant delay in speech and some word finding problems. He was able to tell me the Christmas plans. Affect appears depressed, frustrated and guarded. No confabulation noted   LAB RESULTS: Repeat lab work shows no monoclonal spike. The total protein is 9.1, which is no different than 11 months ago.  Lab Results  Component Value Date   WBC 7.6 02/19/2014   NEUTROABS 3.4 02/19/2014   HGB 15.5 02/19/2014   HCT 47.1 02/19/2014   MCV 95.8 02/19/2014   PLT 281 02/19/2014      Chemistry      Component Value Date/Time   NA 137 02/19/2014 1116   NA 140 10/16/2013 1411   K 4.2 02/19/2014 1116   K 3.6* 10/16/2013 1411   CL 104 10/16/2013 1411   CL 106 08/28/2012 1138   CO2 27 02/19/2014 1116   CO2 23 10/16/2013 1400   BUN 12.7 02/19/2014 1116   BUN 12 10/16/2013 1411   CREATININE 1.1 02/19/2014 1116   CREATININE 1.00 10/16/2013 1411   CREATININE 1.30 07/29/2012 0219      Component Value Date/Time   CALCIUM 9.7 02/19/2014 1116   CALCIUM 9.0 10/16/2013 1400   ALKPHOS 76 02/19/2014 1116   ALKPHOS 75 10/16/2013 1400   AST 35* 02/19/2014 1116   AST 17  10/16/2013 1400   ALT 25 02/19/2014 1116   ALT 12 10/16/2013  1400   BILITOT 0.98 02/19/2014 1116   BILITOT 0.7 10/16/2013 1400       No results found for: LABCA2  No results for input(s): INR in the last 168 hours.  Urinalysis    Component Value Date/Time   COLORURINE YELLOW 10/16/2013 1440   APPEARANCEUR HAZY* 10/16/2013 1440   LABSPEC 1.022 10/16/2013 1440   PHURINE 5.5 10/16/2013 1440   GLUCOSEU NEGATIVE 10/16/2013 1440   HGBUR NEGATIVE 10/16/2013 1440   HGBUR negative 04/13/2009 1008   BILIRUBINUR NEGATIVE 10/16/2013 1440   KETONESUR NEGATIVE 10/16/2013 1440   PROTEINUR NEGATIVE 10/16/2013 1440   UROBILINOGEN 0.2 10/16/2013 1440   NITRITE NEGATIVE 10/16/2013 1440   LEUKOCYTESUR NEGATIVE 10/16/2013 1440    STUDIES: CLINICAL DATA: Confusion with agitation. Speech difficulty.  EXAM: MRI HEAD WITHOUT CONTRAST  MRA HEAD WITHOUT CONTRAST  TECHNIQUE: Multiplanar, multiecho pulse sequences of the brain and surrounding structures were obtained without intravenous contrast. Angiographic images of the head were obtained using MRA technique without contrast.  COMPARISON: CT head 10/16/2013. MR head 10/02/2013. Also MR head 12/19/2011.  FINDINGS: MRI HEAD FINDINGS  The patient was unable to remain motionless for the exam. Small or subtle lesions could be overlooked.  No evidence for acute infarction, hemorrhage, mass lesion, or extra-axial fluid. Generalized cerebral and cerebellar atrophy, with moderately advanced subcortical and periventricular white matter signal abnormality suggesting chronic microvascular ischemic change. Flow voids are maintained. No chronic hemorrhage. No definite midline abnormality. Extracranial soft tissues unremarkable.  MRA HEAD FINDINGS  There is no proximal stenosis or flow reducing lesion of the internal carotid arteries or basilar artery. Both vertebral arteries contribute to formation of the basilar with the left dominant. Minor non stenotic irregularity of the anterior,  middle, and posterior cerebral arteries without proximal flow reducing lesion. No intracranial aneurysm. Cerebellar branches are poorly seen.  IMPRESSION: Atrophy and small vessel disease. No acute intracranial findings. Similar appearance to prior MR.  Unremarkable intracranial MRA without proximal flow reducing lesion.   Electronically Signed  By: Rolla Flatten M.D.  On: 10/17/2013 11:04  ASSESSMENT: 73 y.o. 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 I do not know to what extent depression is complicating this. He has a neurologist, a psychiatrist, and a pain expert working with him. I do not know how much support Tyler Mayo is getting, and I was not able to discuss caregiver issues today.  From my point of view I repeated the fact that I do not have a clear diagnosis of cancer, but if there is one it would be a very slow growing and indolent one, requiring no treatment but only follow-up.  Accordingly he will return to see me in one year. We will do lab work and a chest x-ray prior to that visit. I gave Tyler Mayo my card and she may call for any problems that may develop before that.  03/01/2014 3:29 PM Chauncey Cruel, MD

## 2014-03-01 NOTE — Telephone Encounter (Signed)
per pof to sch pt appt-gave pt copy of sch-adv pt of CXR-pt will have done on 02/18/15 after labs @ [facility]

## 2014-03-01 NOTE — Addendum Note (Signed)
Addended by: Laureen Abrahams on: 03/01/2014 06:44 PM   Modules accepted: Orders, Medications

## 2014-03-18 ENCOUNTER — Encounter (HOSPITAL_COMMUNITY): Payer: Self-pay | Admitting: Interventional Cardiology

## 2014-03-19 ENCOUNTER — Ambulatory Visit: Payer: Self-pay | Admitting: Adult Health

## 2014-03-19 ENCOUNTER — Ambulatory Visit: Payer: Medicare Other | Admitting: Adult Health

## 2014-03-30 ENCOUNTER — Ambulatory Visit: Payer: Medicare Other | Admitting: Thoracic Surgery (Cardiothoracic Vascular Surgery)

## 2014-03-30 ENCOUNTER — Inpatient Hospital Stay: Admission: RE | Admit: 2014-03-30 | Payer: Medicare Other | Source: Ambulatory Visit

## 2014-04-14 ENCOUNTER — Other Ambulatory Visit: Payer: Self-pay | Admitting: Cardiovascular Disease

## 2014-05-07 ENCOUNTER — Encounter: Payer: Medicare Other | Admitting: Cardiovascular Disease

## 2014-05-07 NOTE — Progress Notes (Signed)
Patient ID: Tyler Mayo, male   DOB: 1940/08/30, 74 y.o.   MRN: [id number] He has a history of CAD, status post inferior STEMI 2/13 treated with a DES to the RCA. He had staged intervention with a DES to the mid LAD as well. He's been followed for lung nodules by Dr. Arlyce Dice. He ultimately underwent wedge resection in 8/13. According to the notes, this was diagnosed as a low-grade lymphoma.  Has had issues with ? Seizures for years.  In ER recently with some imaginativeness and confusion  EEG negative has f/u with Dr Jannifer Franklin now on [place]  Frustrated by issues with memory Likely dementia  Has neurologist, psychiatrist and seeing Dr Jana Hakim for his MALT    ROS: Denies fever, malais, weight loss, blurry vision, decreased visual acuity, cough, sputum, SOB, hemoptysis, pleuritic pain, palpitaitons, heartburn, abdominal pain, melena, lower extremity edema, claudication, or rash.  All other systems reviewed and negative  General: Affect appropriate Chronically ill white male  HEENT: normal Neck supple with no adenopathy JVP normal no bruits no thyromegaly Lungs clear with no wheezing and good diaphragmatic motion Heart:  S1/S2 no murmur, no rub, gallop or click PMI normal Abdomen: benighn, BS positve, no tenderness, no AAA no bruit.  No HSM or HJR Distal pulses intact with no bruits No edema Neuro non-focal Skin warm and dry No muscular weakness   Current Outpatient Prescriptions  Medication Sig Dispense Refill  . aspirin EC 81 MG tablet Take 81 mg by mouth daily.    . clonazePAM (KLONOPIN) 0.5 MG tablet Take 0.5 mg by mouth 2 (two) times daily as needed for anxiety.     . diphenhydrAMINE (BENADRYL) 25 MG tablet Take 25 mg by mouth every 6 (six) hours as needed for itching or allergies.     Marland Kitchen EFFIENT 10 MG TABS tablet TAKE 1 TABLET (10 MG TOTAL) BY MOUTH DAILY. 30 tablet 5  . HYDROcodone-acetaminophen (NORCO/VICODIN) 5-325 MG per tablet Take 1 tablet by mouth every 8 (eight) hours as  needed for pain (take 1 or 2 tablets 3 times daily as needed ). 40 tablet 0  . metoprolol tartrate (LOPRESSOR) 25 MG tablet TAKE 1/2 TABLET (12.5 MG TOTAL) BY MOUTH 2 (TWO) TIMES DAILY. 30 tablet 0  . nitroGLYCERIN (NITROSTAT) 0.4 MG SL tablet Place 1 tablet (0.4 mg total) under the tongue every 5 (five) minutes as needed for chest pain. 25 tablet 4  . VIIBRYD 40 MG TABS Take 20 mg by mouth.      No current facility-administered medications for this visit.    Allergies  Ambien; Aricept; Lipitor; and Crestor  Electrocardiogram:  SR rate 19 old IMI   Assessment and Plan

## 2014-05-17 ENCOUNTER — Other Ambulatory Visit: Payer: Self-pay | Admitting: Cardiovascular Disease

## 2014-05-28 ENCOUNTER — Encounter: Payer: Self-pay | Admitting: Cardiovascular Disease

## 2014-05-28 ENCOUNTER — Ambulatory Visit (INDEPENDENT_AMBULATORY_CARE_PROVIDER_SITE_OTHER): Payer: Medicare Other | Admitting: Cardiovascular Disease

## 2014-05-28 VITALS — BP 118/84 | HR 88 | Ht 70.0 in | Wt 218.0 lb

## 2014-05-28 DIAGNOSIS — M549 Dorsalgia, unspecified: Secondary | ICD-10-CM | POA: Insufficient documentation

## 2014-05-28 DIAGNOSIS — M5441 Lumbago with sciatica, right side: Secondary | ICD-10-CM

## 2014-05-28 DIAGNOSIS — I2583 Coronary atherosclerosis due to lipid rich plaque: Principal | ICD-10-CM

## 2014-05-28 DIAGNOSIS — I2510 Atherosclerotic heart disease of native coronary artery without angina pectoris: Secondary | ICD-10-CM

## 2014-05-28 DIAGNOSIS — R413 Other amnesia: Secondary | ICD-10-CM

## 2014-05-28 DIAGNOSIS — G40201 Localization-related (focal) (partial) symptomatic epilepsy and epileptic syndromes with complex partial seizures, not intractable, with status epilepticus: Secondary | ICD-10-CM

## 2014-05-28 DIAGNOSIS — M5442 Lumbago with sciatica, left side: Secondary | ICD-10-CM

## 2014-05-28 NOTE — Assessment & Plan Note (Addendum)
Seems stable  F/u neurology not on aricept or namenda but taking viibyrd for depression

## 2014-05-28 NOTE — Assessment & Plan Note (Signed)
None recently on Keppra  F/u neurology

## 2014-05-28 NOTE — Progress Notes (Signed)
Patient ID: Tyler Mayo, male   DOB: Sep 10, 1940, 74 y.o.   MRN: [id number] He has a history of CAD, status post inferior STEMI 2/13 treated with a DES to the RCA. He had staged intervention with a DES to the mid LAD as well. He's been followed for lung nodules by Dr. Arlyce Dice. He ultimately underwent wedge resection in 8/13. According to the notes, this was diagnosed as a low-grade lymphoma.  Has had issues with ? Seizures for years.  In ER recently with some imaginativeness and confusion  EEG negative has f/u with Dr Jannifer Franklin now on [place]  Frustrated by issues with memory  Primary issue is poor memory and back pain  Dr Ronnald Ramp does not seem to think there is a surgical option  Would benefit from PT again   ROS: Denies fever, malais, weight loss, blurry vision, decreased visual acuity, cough, sputum, SOB, hemoptysis, pleuritic pain, palpitaitons, heartburn, abdominal pain, melena, lower extremity edema, claudication, or rash.  All other systems reviewed and negative  General: Affect appropriate Chronically ill white male  HEENT: normal Neck supple with no adenopathy JVP normal no bruits no thyromegaly Lungs clear with no wheezing and good diaphragmatic motion Heart:  S1/S2 no murmur, no rub, gallop or click PMI normal Abdomen: benighn, BS positve, no tenderness, no AAA no bruit.  No HSM or HJR Distal pulses intact with no bruits No edema Neuro non-focal Skin warm and dry No muscular weakness   Current Outpatient Prescriptions  Medication Sig Dispense Refill  . aspirin EC 81 MG tablet Take 81 mg by mouth daily.    . clonazePAM (KLONOPIN) 0.5 MG tablet Take 0.5 mg by mouth 2 (two) times daily as needed for anxiety.     . diphenhydrAMINE (BENADRYL) 25 MG tablet Take 25 mg by mouth every 6 (six) hours as needed for itching or allergies.     Marland Kitchen EFFIENT 10 MG TABS tablet TAKE 1 TABLET (10 MG TOTAL) BY MOUTH DAILY. 30 tablet 5  . HYDROcodone-acetaminophen (NORCO/VICODIN) 5-325 MG per tablet Take  1 tablet by mouth every 8 (eight) hours as needed for pain (take 1 or 2 tablets 3 times daily as needed ). 40 tablet 0  . metoprolol tartrate (LOPRESSOR) 25 MG tablet TAKE 1/2 TABLET (12.5 MG TOTAL) BY MOUTH 2 (TWO) TIMES DAILY. 30 tablet 6  . nitroGLYCERIN (NITROSTAT) 0.4 MG SL tablet Place 1 tablet (0.4 mg total) under the tongue every 5 (five) minutes as needed for chest pain. 25 tablet 4  . VIIBRYD 40 MG TABS Take 20 mg by mouth.      No current facility-administered medications for this visit.    Allergies  Ambien; Aricept; Lipitor; and Crestor  Electrocardiogram:  SR rate 28 old IMI   10/17/13    Assessment and Plan

## 2014-05-28 NOTE — Patient Instructions (Signed)
Your physician wants you to follow-up in:  6 MONTHS WITH DR NISHAN  You will receive a reminder letter in the mail two months in advance. If you don't receive a letter, please call our office to schedule the follow-up appointment. Your physician recommends that you continue on your current medications as directed. Please refer to the Current Medication list given to you today. 

## 2014-05-28 NOTE — Assessment & Plan Note (Signed)
Stable with no angina and good activity level.  Continue medical Rx  

## 2014-05-28 NOTE — Assessment & Plan Note (Signed)
Encouraged him to resume PT and or other Rx such as acupuncture  F/U Dr Ronnald Ramp

## 2014-06-11 ENCOUNTER — Ambulatory Visit: Payer: Self-pay | Admitting: Adult Health

## 2014-07-09 ENCOUNTER — Ambulatory Visit: Payer: Self-pay | Admitting: Adult Health

## 2014-07-13 ENCOUNTER — Other Ambulatory Visit: Payer: Self-pay | Admitting: Family Medicine

## 2014-07-13 DIAGNOSIS — R1013 Epigastric pain: Secondary | ICD-10-CM

## 2014-07-13 DIAGNOSIS — R634 Abnormal weight loss: Secondary | ICD-10-CM

## 2014-07-13 DIAGNOSIS — R112 Nausea with vomiting, unspecified: Secondary | ICD-10-CM

## 2014-07-15 ENCOUNTER — Ambulatory Visit
Admission: RE | Admit: 2014-07-15 | Discharge: 2014-07-15 | Disposition: A | Discharge status: Home / Self Care | Payer: Medicare Other | Source: Ambulatory Visit | Attending: Family Medicine | Admitting: Family Medicine

## 2014-07-15 DIAGNOSIS — R112 Nausea with vomiting, unspecified: Secondary | ICD-10-CM

## 2014-07-15 DIAGNOSIS — R1013 Epigastric pain: Secondary | ICD-10-CM

## 2014-07-15 DIAGNOSIS — R634 Abnormal weight loss: Secondary | ICD-10-CM

## 2014-07-15 MED ORDER — IOPAMIDOL (ISOVUE-300) INJECTION 61%
125.0000 mL | Freq: Once | INTRAVENOUS | Status: AC | PRN
  Administered 2014-07-15: 125 mL via INTRAVENOUS

## 2014-07-16 ENCOUNTER — Other Ambulatory Visit: Payer: Self-pay | Admitting: Family Medicine

## 2014-07-16 DIAGNOSIS — R935 Abnormal findings on diagnostic imaging of other abdominal regions, including retroperitoneum: Secondary | ICD-10-CM

## 2014-07-19 ENCOUNTER — Emergency Department (HOSPITAL_COMMUNITY)
Admission: EM | Admit: 2014-07-19 | Discharge: 2014-07-19 | Disposition: A | Discharge status: Home / Self Care | Payer: Medicare Other | Attending: Emergency Medicine | Admitting: Emergency Medicine

## 2014-07-19 ENCOUNTER — Ambulatory Visit
Admission: RE | Admit: 2014-07-19 | Discharge: 2014-07-19 | Disposition: A | Discharge status: Home / Self Care | Payer: Medicare Other | Source: Ambulatory Visit | Attending: Family Medicine | Admitting: Family Medicine

## 2014-07-19 ENCOUNTER — Encounter (HOSPITAL_COMMUNITY): Payer: Self-pay

## 2014-07-19 ENCOUNTER — Other Ambulatory Visit: Payer: Self-pay | Admitting: Family Medicine

## 2014-07-19 DIAGNOSIS — F911 Conduct disorder, childhood-onset type: Secondary | ICD-10-CM | POA: Diagnosis not present

## 2014-07-19 DIAGNOSIS — G4700 Insomnia, unspecified: Secondary | ICD-10-CM | POA: Insufficient documentation

## 2014-07-19 DIAGNOSIS — Z87448 Personal history of other diseases of urinary system: Secondary | ICD-10-CM | POA: Insufficient documentation

## 2014-07-19 DIAGNOSIS — F419 Anxiety disorder, unspecified: Secondary | ICD-10-CM | POA: Diagnosis not present

## 2014-07-19 DIAGNOSIS — G40909 Epilepsy, unspecified, not intractable, without status epilepticus: Secondary | ICD-10-CM | POA: Diagnosis not present

## 2014-07-19 DIAGNOSIS — G8929 Other chronic pain: Secondary | ICD-10-CM | POA: Insufficient documentation

## 2014-07-19 DIAGNOSIS — R935 Abnormal findings on diagnostic imaging of other abdominal regions, including retroperitoneum: Secondary | ICD-10-CM

## 2014-07-19 DIAGNOSIS — Z7982 Long term (current) use of aspirin: Secondary | ICD-10-CM | POA: Insufficient documentation

## 2014-07-19 DIAGNOSIS — E785 Hyperlipidemia, unspecified: Secondary | ICD-10-CM | POA: Insufficient documentation

## 2014-07-19 DIAGNOSIS — Z87891 Personal history of nicotine dependence: Secondary | ICD-10-CM | POA: Insufficient documentation

## 2014-07-19 DIAGNOSIS — K828 Other specified diseases of gallbladder: Secondary | ICD-10-CM | POA: Diagnosis not present

## 2014-07-19 DIAGNOSIS — Z79899 Other long term (current) drug therapy: Secondary | ICD-10-CM | POA: Insufficient documentation

## 2014-07-19 DIAGNOSIS — Z87438 Personal history of other diseases of male genital organs: Secondary | ICD-10-CM | POA: Diagnosis not present

## 2014-07-19 DIAGNOSIS — Z9889 Other specified postprocedural states: Secondary | ICD-10-CM | POA: Insufficient documentation

## 2014-07-19 DIAGNOSIS — F329 Major depressive disorder, single episode, unspecified: Secondary | ICD-10-CM | POA: Diagnosis not present

## 2014-07-19 DIAGNOSIS — R1011 Right upper quadrant pain: Secondary | ICD-10-CM | POA: Diagnosis present

## 2014-07-19 DIAGNOSIS — I2510 Atherosclerotic heart disease of native coronary artery without angina pectoris: Secondary | ICD-10-CM | POA: Insufficient documentation

## 2014-07-19 DIAGNOSIS — K829 Disease of gallbladder, unspecified: Secondary | ICD-10-CM

## 2014-07-19 HISTORY — DX: Unspecified convulsions: R56.9

## 2014-07-19 LAB — CBC WITH DIFFERENTIAL/PLATELET
Basophils Absolute: 0 10*3/uL (ref 0.0–0.1)
Basophils Relative: 0 % (ref 0–1)
Eosinophils Absolute: 0.3 10*3/uL (ref 0.0–0.7)
Eosinophils Relative: 3 % (ref 0–5)
HCT: 42.5 % (ref 39.0–52.0)
HEMOGLOBIN: 14.1 g/dL (ref 13.0–17.0)
LYMPHS ABS: 2.6 10*3/uL (ref 0.7–4.0)
Lymphocytes Relative: 27 % (ref 12–46)
MCH: 32.2 pg (ref 26.0–34.0)
MCHC: 33.2 g/dL (ref 30.0–36.0)
MCV: 97 fL (ref 78.0–100.0)
MONOS PCT: 10 % (ref 3–12)
Monocytes Absolute: 1 10*3/uL (ref 0.1–1.0)
NEUTROS ABS: 5.9 10*3/uL (ref 1.7–7.7)
NEUTROS PCT: 60 % (ref 43–77)
PLATELETS: 305 10*3/uL (ref 150–400)
RBC: 4.38 MIL/uL (ref 4.22–5.81)
RDW: 13.5 % (ref 11.5–15.5)
WBC: 9.8 10*3/uL (ref 4.0–10.5)

## 2014-07-19 LAB — COMPREHENSIVE METABOLIC PANEL
ALBUMIN: 3.1 g/dL — AB (ref 3.5–5.2)
ALK PHOS: 63 U/L (ref 39–117)
ALT: 21 U/L (ref 0–53)
ANION GAP: 9 (ref 5–15)
AST: 26 U/L (ref 0–37)
BILIRUBIN TOTAL: 1.3 mg/dL — AB (ref 0.3–1.2)
BUN: 12 mg/dL (ref 6–23)
CHLORIDE: 101 mmol/L (ref 96–112)
CO2: 26 mmol/L (ref 19–32)
Calcium: 8.8 mg/dL (ref 8.4–10.5)
Creatinine, Ser: 1.16 mg/dL (ref 0.50–1.35)
GFR, EST AFRICAN AMERICAN: 71 mL/min — AB (ref >90)
GFR, EST NON AFRICAN AMERICAN: 62 mL/min — AB (ref >90)
Glucose, Bld: 109 mg/dL — ABNORMAL HIGH (ref 70–99)
POTASSIUM: 3.5 mmol/L (ref 3.5–5.1)
Sodium: 136 mmol/L (ref 135–145)
Total Protein: 8.7 g/dL — ABNORMAL HIGH (ref 6.0–8.3)

## 2014-07-19 LAB — LIPASE, BLOOD: LIPASE: 45 U/L (ref 11–59)

## 2014-07-19 MED ORDER — ONDANSETRON HCL 4 MG/2ML IJ SOLN
4.0000 mg | Freq: Once | INTRAMUSCULAR | Status: AC
  Administered 2014-07-19: 4 mg via INTRAVENOUS
  Filled 2014-07-19: qty 2

## 2014-07-19 MED ORDER — FENTANYL CITRATE 0.05 MG/ML IJ SOLN
100.0000 ug | Freq: Once | INTRAMUSCULAR | Status: AC
Start: 2014-07-19 — End: 2014-07-19
  Administered 2014-07-19: 100 ug via INTRAVENOUS
  Filled 2014-07-19: qty 2

## 2014-07-19 NOTE — ED Notes (Signed)
Pt. Reports right upper quadrant pain x1 week. Sent by Dr. Jacelyn Grip at [facility] due to ultrasound showing gallstones.

## 2014-07-19 NOTE — ED Provider Notes (Signed)
CSN: [id number]     Arrival date & time 07/19/14  1714 History   First MD Initiated Contact with Patient 07/19/14 1943     Chief Complaint  Patient presents with  . Cholelithiasis     (Consider location/radiation/quality/duration/timing/severity/associated sxs/prior Treatment) HPI   Tyler Mayo is a 74 y.o. male here for evaluation of right upper quadrant abdominal pain present for 2 weeks, intermittently. He has been having some trouble eating, but none in the last few days. Last week he had a CT scan that was not completely diagnostic. His doctor placed him on Cipro at that time for an unknown reason. He had a repeat image, today with ultrasound that showed cholelithiasis, with possible cholecystitis. His pain has been controlled. The hydrocodone, which he takes for chronic back pain. He has not had fever, chills, nausea or vomiting. After the ultrasound today he went home and ate lunch. His doctor then called him and recommended he come here for evaluation. There are no other no modifying factors.     Past Medical History  Diagnosis Date  . Lung nodules     s/p bx with Dr. 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 CAD (coronary artery disease)     a.  8.2012 neg MV;  b.  05/12/2011 Inf STEMI - 100% RCA - 3.5x22 Resolute DES, Residual 80% LAD dzs. c. s/p planned DES to LAD 06/01/11.  Marland Kitchen Hyperlipidemia     takes Crestor daily  . History of tobacco abuse   . Joint pain   . Chronic back pain   . Bruises easily     takes Effient daily  . Enlarged prostate   . Depression with anxiety     takes Vibryd daily and Clonazepam prn; dx with PTSD and sees Dr.McKinney  . Erectile dysfunction   . Insomnia     takes an OTC sleep aide  . Carotid stenosis     dopplers 9/13:  RICA 40-59%  . Memory deficit 09/17/2013  . Seizures    Past Surgical History  Procedure Laterality Date  . Tonsillectomy    . Adenoidectomy    . Ankle fracture surgery    . Cervical disc  surgery      3 level fusion with Cadaver Simona Huh June 2009 successful.  No complications  . Fiberoptic bronchoscopy with endobronchial  09/23/2010  . L1 vertebroplasty and l1 core biopsy.  08/18/2010  . Right thigh abscess incision and drainage.  05/04/2010  . Right inguinal hernia repair with extended prolene hernia  12/24/2008  . Decompressive anterior cervical diskectomy, c5-c6 and c6-c7.  08/20/2007  . Cardiac catheterization    . Cardiac stents      2 stents   . Colonoscopy    . Cataracts removed      bilateral  . Right lung biopsy    . Left heart catheterization with coronary angiogram N/A 05/12/2011    Procedure: LEFT HEART CATHETERIZATION WITH CORONARY ANGIOGRAM;  Surgeon: Sinclair Grooms, MD;  Location: [facility] CATH LAB;  Service: Cardiovascular;  Laterality: N/A;  . Percutaneous coronary stent intervention (pci-s) Right 05/12/2011    Procedure: PERCUTANEOUS CORONARY STENT INTERVENTION (PCI-S);  Surgeon: Sinclair Grooms, MD;  Location: [facility] CATH LAB;  Service: Cardiovascular;  Laterality: Right;  . Percutaneous coronary stent intervention (pci-s) N/A 06/01/2011    Procedure: PERCUTANEOUS CORONARY STENT INTERVENTION (PCI-S);  Surgeon: Sherren Mocha, MD;  Location: [facility] CATH LAB;  Service: Cardiovascular;  Laterality: N/A;  Family History  Problem Relation Age of Onset  . Alcohol abuse Father   . Lung cancer Father   . Heart disease Mother    History  Substance Use Topics  . Smoking status: Former Smoker -- 0.50 packs/day for 25 years    Types: Cigars    Quit date: 04/10/1991  . Smokeless tobacco: Never Used  . Alcohol Use: No     Comment: ETOH free x 74yrs    Review of Systems  All other systems reviewed and are negative.     Allergies  Ambien; Aricept; Lipitor; and Crestor  Home Medications   Prior to Admission medications   Medication Sig Start Date End Date Taking? Authorizing Provider  aspirin EC 81 MG tablet Take 81 mg by mouth daily.    Historical Provider, MD   clonazePAM (KLONOPIN) 0.5 MG tablet Take 0.5 mg by mouth 2 (two) times daily as needed for anxiety.  12/24/11   Shanker Kristeen Mans, MD  diphenhydrAMINE (BENADRYL) 25 MG tablet Take 25 mg by mouth every 6 (six) hours as needed for itching or allergies.     Historical Provider, MD  EFFIENT 10 MG TABS tablet TAKE 1 TABLET (10 MG TOTAL) BY MOUTH DAILY. 02/23/14   Josue Hector, MD  HYDROcodone-acetaminophen (NORCO/VICODIN) 5-325 MG per tablet Take 1 tablet by mouth every 8 (eight) hours as needed for pain (take 1 or 2 tablets 3 times daily as needed ). 03/03/12   Melrose Nakayama, MD  metoprolol tartrate (LOPRESSOR) 25 MG tablet TAKE 1/2 TABLET (12.5 MG TOTAL) BY MOUTH 2 (TWO) TIMES DAILY. 05/19/14   Josue Hector, MD  nitroGLYCERIN (NITROSTAT) 0.4 MG SL tablet Place 1 tablet (0.4 mg total) under the tongue every 5 (five) minutes as needed for chest pain. 03/26/13   Josue Hector, MD  VIIBRYD 40 MG TABS Take 20 mg by mouth.  08/22/12   Historical Provider, MD   BP 153/81 mmHg  Pulse 84  Temp(Src) 98.3 F (36.8 C)  Resp 17  Wt 218 lb (98.884 kg)  SpO2 95% Physical Exam  Constitutional: He appears well-developed. No distress.  Elderly, frail  HENT:  Head: Normocephalic and atraumatic.  Right Ear: External ear normal.  Left Ear: External ear normal.  Eyes: Conjunctivae and EOM are normal. Pupils are equal, round, and reactive to light.  Neck: Normal range of motion and phonation normal. Neck supple.  Cardiovascular: Normal rate, regular rhythm and normal heart sounds.   Pulmonary/Chest: Effort normal and breath sounds normal. He exhibits no bony tenderness.  Abdominal: Soft. There is no tenderness.  Musculoskeletal: Normal range of motion.  Neurological: He is alert. No cranial nerve deficit or sensory deficit. He exhibits normal muscle tone. Coordination normal.  Poor historian  Skin: Skin is warm, dry and intact.  Psychiatric:  He is somewhat defensive, and aggressive.  Nursing note  and vitals reviewed.   ED Course  Procedures (including critical care time)  Medications  fentaNYL (SUBLIMAZE) injection 100 mcg (100 mcg Intravenous Given 07/19/14 2025)  ondansetron (ZOFRAN) injection 4 mg (4 mg Intravenous Given 07/19/14 2025)    Patient Vitals for the past 24 hrs:  BP Temp Pulse Resp SpO2 Weight  07/19/14 2130 153/81 mmHg - 84 - 95 % -  07/19/14 2115 142/77 mmHg - 84 - 95 % -  07/19/14 2100 151/87 mmHg - 84 - 96 % -  07/19/14 2045 145/87 mmHg - 84 - 93 % -  07/19/14 2030 156/94 mmHg - 79 -  96 % -  07/19/14 2000 122/72 mmHg - 81 17 97 % -  07/19/14 1945 132/72 mmHg - 74 16 95 % -  07/19/14 1930 139/80 mmHg - 76 12 98 % -  07/19/14 1731 - - - - - 218 lb (98.884 kg)  07/19/14 1730 146/83 mmHg 98.3 F (36.8 C) 97 18 97 % 218 lb (98.884 kg)      Findings discussed with patient and wife, all questions answered.   Labs Review Labs Reviewed  COMPREHENSIVE METABOLIC PANEL - Abnormal; Notable for the following:    Glucose, Bld 109 (*)    Total Protein 8.7 (*)    Albumin 3.1 (*)    Total Bilirubin 1.3 (*)    GFR calc non Af Amer 62 (*)    GFR calc Af Amer 71 (*)    All other components within normal limits  CBC WITH DIFFERENTIAL/PLATELET  LIPASE, BLOOD    Imaging Review US Abdomen Limited Ruq  07/19/2014   CLINICAL DATA:  Right upper quadrant abdominal pain, nausea, vomiting, diarrhea for 2 weeks. Gallbladder wall thickening on prior exam 07/15/2014.  EXAM: US ABDOMEN LIMITED - RIGHT UPPER QUADRANT  COMPARISON:  CT 07/15/2014  FINDINGS: Gallbladder:  Gallbladder wall thickening is identified measuring 6 mm maximally. No pericholecystic fluid. Adherent sludge balls or stones are identified measuring 5 mm maximally. The sonographer reports a sonographic Percell Miller sign is present.  Common bile duct:  Diameter: 3 mm  Liver:  Inhomogeneously echogenic without focal abnormality.  IMPRESSION: Gallbladder wall thickening with adherent stones or sludge balls and  technologist reported sonographic Murphy sign. Findings are again highly suspicious for acute cholecystitis.  Inhomogeneously echogenic liver echotexture which may indicate steatosis but other infiltrative processes could appear similar.  These results will be called to the ordering clinician or representative by the Radiologist Assistant, and communication documented in the PACS or zVision Dashboard.   Electronically Signed   By: Conchita Paris M.D.   On: 07/19/2014 16:00     EKG Interpretation None      MDM   Final diagnoses:  Gallbladder disease    Ongoing right upper quadrant abdominal pain, controlled with usual pain medications. No clear clinical evidence for cholecystitis. Patient can be managed as an outpatient with surgery follow-up. There is no indication for evaluation by gastroenterology.  Nursing Notes Reviewed/ Care Coordinated Applicable Imaging Reviewed Interpretation of Laboratory Data incorporated into ED treatment  The patient appears reasonably screened and/or stabilized for discharge and I doubt any other medical condition or other [facility] requiring further screening, evaluation, or treatment in the ED at this time prior to discharge.  Plan: Home Medications- usual; Home Treatments- rest, fluids, low-fat diet; return here if the recommended treatment, does not improve the symptoms; Recommended follow up- Gen. Surg. 3-7 days     Daleen Bo, MD 07/19/14 2133

## 2014-07-19 NOTE — Discharge Instructions (Signed)
Stay on a low-fat diet on until you see the general surgeon. Return here if needed, for problems.   Cholelithiasis Cholelithiasis (also called gallstones) is a form of gallbladder disease in which gallstones form in your gallbladder. The gallbladder is an organ that stores bile made in the liver, which helps digest fats. Gallstones begin as small crystals and slowly grow into stones. Gallstone pain occurs when the gallbladder spasms and a gallstone is blocking the duct. Pain can also occur when a stone passes out of the duct.  RISK FACTORS  Being male.   Having multiple pregnancies. Health care providers sometimes advise removing diseased gallbladders before future pregnancies.   Being obese.  Eating a diet heavy in fried foods and fat.   Being older than 66 years and increasing age.   Prolonged use of medicines containing male hormones.   Having diabetes mellitus.   Rapidly losing weight.   Having a family history of gallstones (heredity).  SYMPTOMS  Nausea.   Vomiting.  Abdominal pain.   Yellowing of the skin (jaundice).   Sudden pain. It may persist from several minutes to several hours.  Fever.   Tenderness to the touch. In some cases, when gallstones do not move into the bile duct, people have no pain or symptoms. These are called "silent" gallstones.  TREATMENT Silent gallstones do not need treatment. In severe cases, emergency surgery may be required. Options for treatment include:  Surgery to remove the gallbladder. This is the most common treatment.  Medicines. These do not always work and may take 6-12 months or more to work.  Shock wave treatment (extracorporeal biliary lithotripsy). In this treatment an ultrasound machine sends shock waves to the gallbladder to break gallstones into smaller pieces that can pass into the intestines or be dissolved by medicine. HOME CARE INSTRUCTIONS   Only take over-the-counter or prescription medicines  for pain, discomfort, or fever as directed by your health care provider.   Follow a low-fat diet until seen again by your health care provider. Fat causes the gallbladder to contract, which can result in pain.   Follow up with your health care provider as directed. Attacks are almost always recurrent and surgery is usually required for permanent treatment.  SEEK IMMEDIATE MEDICAL CARE IF:   Your pain increases and is not controlled by medicines.   You have a fever or persistent symptoms for more than 2-3 days.   You have a fever and your symptoms suddenly get worse.   You have persistent nausea and vomiting.  MAKE SURE YOU:   Understand these instructions.  Will watch your condition.  Will get help right away if you are not doing well or get worse. Document Released: 03/22/2005 Document Revised: 11/26/2012 Document Reviewed: 09/17/2012 [facility] Patient Information 2015 [place], [place]. This information is not intended to replace advice given to you by your health care provider. Make sure you discuss any questions you have with your health care provider.

## 2014-07-28 ENCOUNTER — Emergency Department (HOSPITAL_COMMUNITY)
Admission: EM | Admit: 2014-07-28 | Discharge: 2014-07-28 | Disposition: A | Discharge status: Home / Self Care | Payer: Medicare Other | Attending: Emergency Medicine | Admitting: Emergency Medicine

## 2014-07-28 ENCOUNTER — Encounter (HOSPITAL_COMMUNITY): Payer: Self-pay | Admitting: Emergency Medicine

## 2014-07-28 DIAGNOSIS — E785 Hyperlipidemia, unspecified: Secondary | ICD-10-CM | POA: Diagnosis not present

## 2014-07-28 DIAGNOSIS — Z87438 Personal history of other diseases of male genital organs: Secondary | ICD-10-CM | POA: Diagnosis not present

## 2014-07-28 DIAGNOSIS — R1032 Left lower quadrant pain: Secondary | ICD-10-CM | POA: Diagnosis not present

## 2014-07-28 DIAGNOSIS — R109 Unspecified abdominal pain: Secondary | ICD-10-CM

## 2014-07-28 DIAGNOSIS — R55 Syncope and collapse: Secondary | ICD-10-CM | POA: Insufficient documentation

## 2014-07-28 DIAGNOSIS — G4700 Insomnia, unspecified: Secondary | ICD-10-CM | POA: Insufficient documentation

## 2014-07-28 DIAGNOSIS — Z7982 Long term (current) use of aspirin: Secondary | ICD-10-CM | POA: Insufficient documentation

## 2014-07-28 DIAGNOSIS — F419 Anxiety disorder, unspecified: Secondary | ICD-10-CM | POA: Diagnosis not present

## 2014-07-28 DIAGNOSIS — I2510 Atherosclerotic heart disease of native coronary artery without angina pectoris: Secondary | ICD-10-CM | POA: Diagnosis not present

## 2014-07-28 DIAGNOSIS — R1012 Left upper quadrant pain: Secondary | ICD-10-CM | POA: Diagnosis not present

## 2014-07-28 DIAGNOSIS — Z79899 Other long term (current) drug therapy: Secondary | ICD-10-CM | POA: Insufficient documentation

## 2014-07-28 DIAGNOSIS — G8929 Other chronic pain: Secondary | ICD-10-CM | POA: Diagnosis not present

## 2014-07-28 DIAGNOSIS — Z8739 Personal history of other diseases of the musculoskeletal system and connective tissue: Secondary | ICD-10-CM | POA: Insufficient documentation

## 2014-07-28 DIAGNOSIS — F329 Major depressive disorder, single episode, unspecified: Secondary | ICD-10-CM | POA: Insufficient documentation

## 2014-07-28 DIAGNOSIS — Z87891 Personal history of nicotine dependence: Secondary | ICD-10-CM | POA: Diagnosis not present

## 2014-07-28 LAB — COMPREHENSIVE METABOLIC PANEL
ALBUMIN: 3.3 g/dL — AB (ref 3.5–5.2)
ALK PHOS: 83 U/L (ref 39–117)
ALT: 26 U/L (ref 0–53)
AST: 64 U/L — ABNORMAL HIGH (ref 0–37)
Anion gap: 9 (ref 5–15)
BUN: 10 mg/dL (ref 6–23)
CALCIUM: 9.1 mg/dL (ref 8.4–10.5)
CO2: 26 mmol/L (ref 19–32)
CREATININE: 1.12 mg/dL (ref 0.50–1.35)
Chloride: 102 mmol/L (ref 96–112)
GFR calc Af Amer: 74 mL/min — ABNORMAL LOW (ref >90)
GFR calc non Af Amer: 64 mL/min — ABNORMAL LOW (ref >90)
GLUCOSE: 110 mg/dL — AB (ref 70–99)
Potassium: 4.4 mmol/L (ref 3.5–5.1)
Sodium: 137 mmol/L (ref 135–145)
Total Bilirubin: 1.3 mg/dL — ABNORMAL HIGH (ref 0.3–1.2)
Total Protein: 8.7 g/dL — ABNORMAL HIGH (ref 6.0–8.3)

## 2014-07-28 LAB — URINALYSIS, ROUTINE W REFLEX MICROSCOPIC
GLUCOSE, UA: NEGATIVE mg/dL
Hgb urine dipstick: NEGATIVE
KETONES UR: NEGATIVE mg/dL
Leukocytes, UA: NEGATIVE
Nitrite: NEGATIVE
PH: 5.5 (ref 5.0–8.0)
PROTEIN: 30 mg/dL — AB
Specific Gravity, Urine: 1.028 (ref 1.005–1.030)
Urobilinogen, UA: 1 mg/dL (ref 0.0–1.0)

## 2014-07-28 LAB — URINE MICROSCOPIC-ADD ON

## 2014-07-28 LAB — CBC WITH DIFFERENTIAL/PLATELET
Basophils Absolute: 0 10*3/uL (ref 0.0–0.1)
Basophils Relative: 0 % (ref 0–1)
Eosinophils Absolute: 0.3 10*3/uL (ref 0.0–0.7)
Eosinophils Relative: 4 % (ref 0–5)
HCT: 41.9 % (ref 39.0–52.0)
Hemoglobin: 14 g/dL (ref 13.0–17.0)
LYMPHS ABS: 1.5 10*3/uL (ref 0.7–4.0)
Lymphocytes Relative: 21 % (ref 12–46)
MCH: 32.1 pg (ref 26.0–34.0)
MCHC: 33.4 g/dL (ref 30.0–36.0)
MCV: 96.1 fL (ref 78.0–100.0)
MONOS PCT: 8 % (ref 3–12)
Monocytes Absolute: 0.5 10*3/uL (ref 0.1–1.0)
NEUTROS ABS: 4.7 10*3/uL (ref 1.7–7.7)
Neutrophils Relative %: 67 % (ref 43–77)
Platelets: 368 10*3/uL (ref 150–400)
RBC: 4.36 MIL/uL (ref 4.22–5.81)
RDW: 13.3 % (ref 11.5–15.5)
WBC: 7 10*3/uL (ref 4.0–10.5)

## 2014-07-28 LAB — I-STAT CG4 LACTIC ACID, ED: LACTIC ACID, VENOUS: 1.77 mmol/L (ref 0.5–2.0)

## 2014-07-28 LAB — I-STAT TROPONIN, ED: TROPONIN I, POC: 0 ng/mL (ref 0.00–0.08)

## 2014-07-28 LAB — LIPASE, BLOOD: Lipase: 41 U/L (ref 11–59)

## 2014-07-28 MED ORDER — ONDANSETRON HCL 4 MG PO TABS: 4.0000 mg | ORAL_TABLET | Freq: Three times a day (TID) | ORAL | Status: AC | PRN

## 2014-07-28 NOTE — ED Notes (Signed)
Upon arrival patient denies nausea or LUQ pain stated "absolutely do not have pain"

## 2014-07-28 NOTE — Discharge Instructions (Signed)

## 2014-07-28 NOTE — ED Notes (Signed)
EKG completed given to EDP.  

## 2014-07-28 NOTE — ED Provider Notes (Signed)
CSN: [id number]     Arrival date & time 07/28/14  1949 History   First MD Initiated Contact with Patient 07/28/14 1950     Chief Complaint  Patient presents with  . Abdominal Pain     (Consider location/radiation/quality/duration/timing/severity/associated sxs/prior Treatment) Patient is a 74 y.o. male presenting with abdominal pain. The history is provided by the patient and the spouse. The history is limited by the condition of the patient. No language interpreter was used.  Abdominal Pain Pain location:  LUQ and LLQ Pain quality: gnawing   Pain radiates to:  Does not radiate Pain severity:  Severe Onset quality:  Sudden Duration: minutes. Progression:  Resolved Chronicity:  New Context: not alcohol use, not awakening from sleep, not diet changes, not eating, not medication withdrawal, not previous surgeries, not recent illness, not suspicious food intake and not trauma   Relieved by:  Nothing (a single dose of something from EMS) Worsened by:  Nothing tried Ineffective treatments:  None tried Associated symptoms: no anorexia, no chest pain, no chills, no dysuria, no fatigue, no fever, no melena, no nausea, no shortness of breath and no vomiting   Risk factors: has not had multiple surgeries     Past Medical History  Diagnosis Date  . Lung nodules     s/p bx with Dr. 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 CAD (coronary artery disease)     a.  8.2012 neg MV;  b.  05/12/2011 Inf STEMI - 100% RCA - 3.5x22 Resolute DES, Residual 80% LAD dzs. c. s/p planned DES to LAD 06/01/11.  Marland Kitchen Hyperlipidemia     takes Crestor daily  . History of tobacco abuse   . Joint pain   . Chronic back pain   . Bruises easily     takes Effient daily  . Enlarged prostate   . Depression with anxiety     takes Vibryd daily and Clonazepam prn; dx with PTSD and sees Dr.McKinney  . Erectile dysfunction   . Insomnia     takes an OTC sleep aide  . Carotid stenosis     dopplers  9/13:  RICA 40-59%  . Memory deficit 09/17/2013  . Seizures    Past Surgical History  Procedure Laterality Date  . Tonsillectomy    . Adenoidectomy    . Ankle fracture surgery    . Cervical disc surgery      3 level fusion with Cadaver Simona Huh June 2009 successful.  No complications  . Fiberoptic bronchoscopy with endobronchial  09/23/2010  . L1 vertebroplasty and l1 core biopsy.  08/18/2010  . Right thigh abscess incision and drainage.  05/04/2010  . Right inguinal hernia repair with extended prolene hernia  12/24/2008  . Decompressive anterior cervical diskectomy, c5-c6 and c6-c7.  08/20/2007  . Cardiac catheterization    . Cardiac stents      2 stents   . Colonoscopy    . Cataracts removed      bilateral  . Right lung biopsy    . Left heart catheterization with coronary angiogram N/A 05/12/2011    Procedure: LEFT HEART CATHETERIZATION WITH CORONARY ANGIOGRAM;  Surgeon: Sinclair Grooms, MD;  Location: [facility] CATH LAB;  Service: Cardiovascular;  Laterality: N/A;  . Percutaneous coronary stent intervention (pci-s) Right 05/12/2011    Procedure: PERCUTANEOUS CORONARY STENT INTERVENTION (PCI-S);  Surgeon: Sinclair Grooms, MD;  Location[facility] CATH LAB;  Service: Cardiovascular;  Laterality: Right;  . Percutaneous coronary stent  intervention (pci-s) N/A 06/01/2011    Procedure: PERCUTANEOUS CORONARY STENT INTERVENTION (PCI-S);  Surgeon: Sherren Mocha, MD;  Location: [facility] CATH LAB;  Service: Cardiovascular;  Laterality: N/A;   Family History  Problem Relation Age of Onset  . Alcohol abuse Father   . Lung cancer Father   . Heart disease Mother    History  Substance Use Topics  . Smoking status: Former Smoker -- 0.50 packs/day for 25 years    Types: Cigars    Quit date: 04/10/1991  . Smokeless tobacco: Never Used  . Alcohol Use: No     Comment: ETOH free x 66yr    Review of Systems  Constitutional: Negative for fever, chills and fatigue.  Respiratory: Negative for chest tightness and  shortness of breath.   Cardiovascular: Negative for chest pain.  Gastrointestinal: Positive for abdominal pain. Negative for nausea, vomiting, melena and anorexia.  Genitourinary: Negative for dysuria.  Neurological: Positive for syncope. Negative for light-headedness and headaches.  Psychiatric/Behavioral: Negative for confusion.  All other systems reviewed and are negative.     Allergies  Ambien; Aricept; Lipitor; and Crestor  Home Medications   Prior to Admission medications   Medication Sig Start Date End Date Taking? Authorizing Provider  aspirin EC 81 MG tablet Take 81 mg by mouth daily.    Historical Provider, MD  clonazePAM (KLONOPIN) 0.5 MG tablet Take 0.5 mg by mouth 2 (two) times daily as needed for anxiety.  12/24/11   Shanker MKristeen Mans MD  diphenhydrAMINE (BENADRYL) 25 MG tablet Take 25 mg by mouth every 6 (six) hours as needed for itching or allergies.     Historical Provider, MD  EFFIENT 10 MG TABS tablet TAKE 1 TABLET (10 MG TOTAL) BY MOUTH DAILY. 02/23/14   PJosue Hector MD  HYDROcodone-acetaminophen (NORCO/VICODIN) 5-325 MG per tablet Take 1 tablet by mouth every 8 (eight) hours as needed for pain (take 1 or 2 tablets 3 times daily as needed ). 03/03/12   SMelrose Nakayama MD  metoprolol tartrate (LOPRESSOR) 25 MG tablet TAKE 1/2 TABLET (12.5 MG TOTAL) BY MOUTH 2 (TWO) TIMES DAILY. 05/19/14   PJosue Hector MD  nitroGLYCERIN (NITROSTAT) 0.4 MG SL tablet Place 1 tablet (0.4 mg total) under the tongue every 5 (five) minutes as needed for chest pain. 03/26/13   PJosue Hector MD  VIIBRYD 40 MG TABS Take 20 mg by mouth.  08/22/12   Historical Provider, MD   ED Triage Vitals  Enc Vitals Group     BP 07/28/14 2002 130/59 mmHg     Pulse Rate 07/28/14 2002 72     Resp 07/28/14 2002 16     Temp 07/28/14 2002 97.6 F (36.4 C)     Temp src --      SpO2 07/28/14 1957 97 %     Weight 07/28/14 2002 210 lb (95.255 kg)     Height 07/28/14 2002 '5\' 10"'$  (1.778 m)     Head  Cir --      Peak Flow --      Pain Score --      Pain Loc --      Pain Edu? --      Excl. in [place] --     Physical Exam  Constitutional: He is oriented to person, place, and time. Vital signs are normal. He appears well-developed and well-nourished. He is cooperative. He is easily aroused. He does not appear ill. No distress.  HENT:  Head: Normocephalic and atraumatic.  Nose: Nose normal.  Mouth/Throat: Oropharynx is clear and moist. No oropharyngeal exudate.  Eyes: EOM are normal. Pupils are equal, round, and reactive to light.  Neck: Normal range of motion. Neck supple.  Cardiovascular: Normal rate, regular rhythm, normal heart sounds and intact distal pulses.   No murmur heard. Pulmonary/Chest: Effort normal and breath sounds normal. No respiratory distress. He has no wheezes. He exhibits no tenderness.  Abdominal: Soft. He exhibits no distension. There is no tenderness. There is no guarding.  Musculoskeletal: Normal range of motion. He exhibits no tenderness.  Neurological: He is alert, oriented to person, place, and time and easily aroused. No cranial nerve deficit. Coordination normal.  Alert and oriented but takes a fair amount of time for patient to come up with answers and produce words, chronic per wife.    Skin: Skin is warm and dry. He is not diaphoretic. No pallor.  Psychiatric: He has a normal mood and affect. His behavior is normal. Judgment and thought content normal. Cognition and memory are impaired.  Nursing note and vitals reviewed.   ED Course  Procedures (including critical care time) Labs Review Labs Reviewed  COMPREHENSIVE METABOLIC PANEL - Abnormal; Notable for the following:    Glucose, Bld 110 (*)    Total Protein 8.7 (*)    Albumin 3.3 (*)    AST 64 (*)    Total Bilirubin 1.3 (*)    GFR calc non Af Amer 64 (*)    GFR calc Af Amer 74 (*)    All other components within normal limits  URINALYSIS, ROUTINE W REFLEX MICROSCOPIC - Abnormal; Notable for the  following:    Color, Urine AMBER (*)    APPearance CLOUDY (*)    Bilirubin Urine SMALL (*)    Protein, ur 30 (*)    All other components within normal limits  URINE MICROSCOPIC-ADD ON - Abnormal; Notable for the following:    Bacteria, UA MANY (*)    Casts HYALINE CASTS (*)    All other components within normal limits  URINE CULTURE  CBC WITH DIFFERENTIAL/PLATELET  LIPASE, BLOOD  I-STAT TROPOININ, ED  I-STAT CG4 LACTIC ACID, ED    Imaging Review No results found.   EKG Interpretation   Date/Time:  Wednesday July 28 2014 19:59:10 EDT Ventricular Rate:  69 PR Interval:  176 QRS Duration: 106 QT Interval:  418 QTC Calculation: 448 R Axis:   13 Text Interpretation:  Sinus rhythm Probable left atrial enlargement RSR'  in V1 or V2, right VCD or RVH No significant change was found Confirmed by  [facility]  MD, TREY ([id number]) on 07/28/2014 10:04:08 PM      MDM   Final diagnoses:  Left lateral abdominal pain   Pt is a 74 yo M with hx of CAD w/ stents, HTN, dementia who presents with left abdominal pain.  Reports he was at [facility] just prior to presentation when he had an acute episode of left sided abd pain.  Had significant pain that caused him to double over.  He was diaphoretic, nauseated but did not vomit. Patient did not syncopize. He denies chest pain, SOB, or back pain.  States the pain was located at his left mid abdomen.  He denied chest pain or SOB during the episode.  Still passing gas since the painful episode.  Has had normal BMs and no urinary changes recently.   Patient was recently worked up for the past week for another episode of similar pain (initial pain was reportedly  not as severe as today's but otherwise similar in type/location of pain).  Had pain 1 x last week then was seen by his PCP.  He had normal labs.  CT abd/pelvis with contrast showed nonspecific changes possibly with mild colitis and possibly with mild BG thickening.  It was followed up by a RUQ [place] that had  findings concerning for acute cholecystitis on 4/11.  He then completed a course of 5 days of cipro. Patient has been pain-free for the last several days, until the severe episode this afternoon.  EMS was called to [facility] and pt reports they gave him one dose of pain meds, and his pain completely resolved.  No continued sx.  Patient has dementia but is able to provide the majority of his history, occasionally needing help with specifics from wife who witnessed the painful episode.  Vitals are unremarkable.  He has no chest or abdominal tenderness to palpation.  Patient can tolerate deep palpation, shaking of his bed, and position changes without complaint of abd pain.  No CVA tenderness, no midline spine tenderness, no GU tenderness and normal testicular lie.    Labs were sent including lipase, LFTs, and troponin.   EKG with NSR, no signs of ischemia.   Trop negative.  Lipase 41.  No leukocytosis.  Normal kidney function.  Isolated AST increase from last week (26 up to 64), but ALT 26 and T bili unchanged from last week (1.3).    Patient was monitored in the ED for several hours and never had return of sx.  As he has benign labs and no physical complaints, I do not believe that patient would benefit from repeat imaging.  He has completed a course of cipro that should cover intraabdominal infections, but doubt infectious cause as patient has been afebrile, no change in BMs, no vomiting, and no continued pain.  Exam not consistent with perforation.  Normal vitals, no continued pain, and appears well so doubt aortic pathology.  Labs are not suggestive of pancreatitis.  Could have been a kidney stone but pt denies urinary sx and urine was negative for blood.  + casts and protein but negative nitrites and leuks, so doubt UTI.    Doubt acute emergent pathology.  Discussed at length with pt and his wife.  Advised close follow up with PCP if his pain returns.  He already has follow up with surgery on Tuesday (6  days).  Given Rx for zofran.   All questions were answered and pt was discharged in stable condition.   Patient was seen with ED Attending, Dr. Gwynneth Aliment, MD  Tori Milks, MD 07/29/14 [place], MD 07/29/14 [phone]

## 2014-07-28 NOTE — ED Notes (Signed)
Patient at [facility]. After urinating and had a bowel movement patient developed LUQ pain 7/10 with nausea. Per EMS witnessed patient pale and diaphoretic. Assisted to ground. EMS obtained IV left hand 20g administered zofran '4mg'$  and upon arrival patient skin dry. Alert to normal.

## 2014-07-30 ENCOUNTER — Ambulatory Visit: Payer: Self-pay | Admitting: Adult Health

## 2014-07-31 LAB — URINE CULTURE: Colony Count: 30000

## 2014-08-01 ENCOUNTER — Encounter (HOSPITAL_COMMUNITY): Payer: Self-pay | Admitting: Emergency Medicine

## 2014-08-01 ENCOUNTER — Emergency Department (HOSPITAL_COMMUNITY): Payer: Medicare Other

## 2014-08-01 ENCOUNTER — Inpatient Hospital Stay (HOSPITAL_COMMUNITY): Payer: Medicare Other

## 2014-08-01 ENCOUNTER — Inpatient Hospital Stay (HOSPITAL_COMMUNITY)
Admission: EM | Admit: 2014-08-01 | Discharge: 2014-08-08 | DRG: 417 | Disposition: A | Discharge status: Skilled Nursing Facility | Payer: Medicare Other | Attending: Internal Medicine | Admitting: Internal Medicine

## 2014-08-01 DIAGNOSIS — I2510 Atherosclerotic heart disease of native coronary artery without angina pectoris: Secondary | ICD-10-CM | POA: Diagnosis present

## 2014-08-01 DIAGNOSIS — F4310 Post-traumatic stress disorder, unspecified: Secondary | ICD-10-CM | POA: Diagnosis present

## 2014-08-01 DIAGNOSIS — Z79899 Other long term (current) drug therapy: Secondary | ICD-10-CM | POA: Diagnosis not present

## 2014-08-01 DIAGNOSIS — E785 Hyperlipidemia, unspecified: Secondary | ICD-10-CM | POA: Diagnosis present

## 2014-08-01 DIAGNOSIS — N179 Acute kidney failure, unspecified: Secondary | ICD-10-CM | POA: Diagnosis present

## 2014-08-01 DIAGNOSIS — Z955 Presence of coronary angioplasty implant and graft: Secondary | ICD-10-CM | POA: Diagnosis not present

## 2014-08-01 DIAGNOSIS — T402X5A Adverse effect of other opioids, initial encounter: Secondary | ICD-10-CM | POA: Diagnosis not present

## 2014-08-01 DIAGNOSIS — K851 Biliary acute pancreatitis without necrosis or infection: Secondary | ICD-10-CM | POA: Diagnosis present

## 2014-08-01 DIAGNOSIS — E876 Hypokalemia: Secondary | ICD-10-CM | POA: Diagnosis present

## 2014-08-01 DIAGNOSIS — Y92239 Unspecified place in hospital as the place of occurrence of the external cause: Secondary | ICD-10-CM | POA: Diagnosis not present

## 2014-08-01 DIAGNOSIS — I129 Hypertensive chronic kidney disease with stage 1 through stage 4 chronic kidney disease, or unspecified chronic kidney disease: Secondary | ICD-10-CM | POA: Diagnosis present

## 2014-08-01 DIAGNOSIS — R5383 Other fatigue: Secondary | ICD-10-CM

## 2014-08-01 DIAGNOSIS — K8062 Calculus of gallbladder and bile duct with acute cholecystitis without obstruction: Secondary | ICD-10-CM | POA: Diagnosis present

## 2014-08-01 DIAGNOSIS — K8040 Calculus of bile duct with cholecystitis, unspecified, without obstruction: Secondary | ICD-10-CM | POA: Diagnosis not present

## 2014-08-01 DIAGNOSIS — G8918 Other acute postprocedural pain: Secondary | ICD-10-CM

## 2014-08-01 DIAGNOSIS — G92 Toxic encephalopathy: Secondary | ICD-10-CM | POA: Diagnosis not present

## 2014-08-01 DIAGNOSIS — K8020 Calculus of gallbladder without cholecystitis without obstruction: Secondary | ICD-10-CM

## 2014-08-01 DIAGNOSIS — I252 Old myocardial infarction: Secondary | ICD-10-CM | POA: Diagnosis not present

## 2014-08-01 DIAGNOSIS — Z87891 Personal history of nicotine dependence: Secondary | ICD-10-CM | POA: Diagnosis not present

## 2014-08-01 DIAGNOSIS — C8590 Non-Hodgkin lymphoma, unspecified, unspecified site: Secondary | ICD-10-CM

## 2014-08-01 DIAGNOSIS — R109 Unspecified abdominal pain: Secondary | ICD-10-CM | POA: Diagnosis present

## 2014-08-01 DIAGNOSIS — F03918 Unspecified dementia, unspecified severity, with other behavioral disturbance: Secondary | ICD-10-CM | POA: Insufficient documentation

## 2014-08-01 DIAGNOSIS — C8599 Non-Hodgkin lymphoma, unspecified, extranodal and solid organ sites: Secondary | ICD-10-CM | POA: Diagnosis present

## 2014-08-01 DIAGNOSIS — K859 Acute pancreatitis, unspecified: Secondary | ICD-10-CM

## 2014-08-01 DIAGNOSIS — Z981 Arthrodesis status: Secondary | ICD-10-CM | POA: Diagnosis not present

## 2014-08-01 DIAGNOSIS — F0391 Unspecified dementia with behavioral disturbance: Secondary | ICD-10-CM | POA: Diagnosis present

## 2014-08-01 DIAGNOSIS — N183 Chronic kidney disease, stage 3 (moderate): Secondary | ICD-10-CM | POA: Diagnosis present

## 2014-08-01 DIAGNOSIS — K760 Fatty (change of) liver, not elsewhere classified: Secondary | ICD-10-CM | POA: Diagnosis present

## 2014-08-01 DIAGNOSIS — Z7982 Long term (current) use of aspirin: Secondary | ICD-10-CM

## 2014-08-01 DIAGNOSIS — K819 Cholecystitis, unspecified: Secondary | ICD-10-CM | POA: Diagnosis not present

## 2014-08-01 DIAGNOSIS — I10 Essential (primary) hypertension: Secondary | ICD-10-CM | POA: Diagnosis not present

## 2014-08-01 DIAGNOSIS — K8042 Calculus of bile duct with acute cholecystitis without obstruction: Secondary | ICD-10-CM | POA: Diagnosis present

## 2014-08-01 LAB — URINALYSIS, ROUTINE W REFLEX MICROSCOPIC
Glucose, UA: NEGATIVE mg/dL
Ketones, ur: 15 mg/dL — AB
Leukocytes, UA: NEGATIVE
Nitrite: NEGATIVE
Protein, ur: 30 mg/dL — AB
Specific Gravity, Urine: 1.012 (ref 1.005–1.030)
Urobilinogen, UA: 1 mg/dL (ref 0.0–1.0)
pH: 6.5 (ref 5.0–8.0)

## 2014-08-01 LAB — CBC WITH DIFFERENTIAL/PLATELET
BASOS PCT: 1 % (ref 0–1)
Basophils Absolute: 0 10*3/uL (ref 0.0–0.1)
EOS ABS: 0 10*3/uL (ref 0.0–0.7)
Eosinophils Relative: 0 % (ref 0–5)
HEMATOCRIT: 42.3 % (ref 39.0–52.0)
HEMOGLOBIN: 14.2 g/dL (ref 13.0–17.0)
LYMPHS ABS: 0.8 10*3/uL (ref 0.7–4.0)
LYMPHS PCT: 11 % — AB (ref 12–46)
MCH: 32.1 pg (ref 26.0–34.0)
MCHC: 33.6 g/dL (ref 30.0–36.0)
MCV: 95.5 fL (ref 78.0–100.0)
MONO ABS: 0.6 10*3/uL (ref 0.1–1.0)
Monocytes Relative: 9 % (ref 3–12)
Neutro Abs: 5.6 10*3/uL (ref 1.7–7.7)
Neutrophils Relative %: 79 % — ABNORMAL HIGH (ref 43–77)
PLATELETS: 394 10*3/uL (ref 150–400)
RBC: 4.43 MIL/uL (ref 4.22–5.81)
RDW: 13.3 % (ref 11.5–15.5)
WBC: 7 10*3/uL (ref 4.0–10.5)

## 2014-08-01 LAB — HEPATIC FUNCTION PANEL
ALT: 289 U/L — AB (ref 0–53)
AST: 376 U/L — ABNORMAL HIGH (ref 0–37)
Albumin: 3.1 g/dL — ABNORMAL LOW (ref 3.5–5.2)
Alkaline Phosphatase: 180 U/L — ABNORMAL HIGH (ref 39–117)
Bilirubin, Direct: 1.7 mg/dL — ABNORMAL HIGH (ref 0.0–0.5)
Indirect Bilirubin: 1.5 mg/dL — ABNORMAL HIGH (ref 0.3–0.9)
Total Bilirubin: 3.2 mg/dL — ABNORMAL HIGH (ref 0.3–1.2)
Total Protein: 8.2 g/dL (ref 6.0–8.3)

## 2014-08-01 LAB — COMPREHENSIVE METABOLIC PANEL
ALK PHOS: 184 U/L — AB (ref 39–117)
ALT: 329 U/L — ABNORMAL HIGH (ref 0–53)
AST: 490 U/L — ABNORMAL HIGH (ref 0–37)
Albumin: 3.5 g/dL (ref 3.5–5.2)
Anion gap: 14 (ref 5–15)
BILIRUBIN TOTAL: 3.4 mg/dL — AB (ref 0.3–1.2)
BUN: 9 mg/dL (ref 6–23)
CALCIUM: 9.1 mg/dL (ref 8.4–10.5)
CO2: 24 mmol/L (ref 19–32)
Chloride: 100 mmol/L (ref 96–112)
Creatinine, Ser: 1.53 mg/dL — ABNORMAL HIGH (ref 0.50–1.35)
GFR calc non Af Amer: 44 mL/min — ABNORMAL LOW (ref >90)
GFR, EST AFRICAN AMERICAN: 51 mL/min — AB (ref >90)
Glucose, Bld: 148 mg/dL — ABNORMAL HIGH (ref 70–99)
POTASSIUM: 3.3 mmol/L — AB (ref 3.5–5.1)
Sodium: 138 mmol/L (ref 135–145)
Total Protein: 9.2 g/dL — ABNORMAL HIGH (ref 6.0–8.3)

## 2014-08-01 LAB — URINE MICROSCOPIC-ADD ON

## 2014-08-01 LAB — LIPASE, BLOOD
Lipase: >3000 U/L — ABNORMAL HIGH (ref 11–59)
Lipase: 2747 U/L — ABNORMAL HIGH (ref 11–59)

## 2014-08-01 LAB — I-STAT CG4 LACTIC ACID, ED: Lactic Acid, Venous: 2.65 mmol/L (ref 0.5–2.0)

## 2014-08-01 MED ORDER — VILAZODONE HCL 40 MG PO TABS
40.0000 mg | ORAL_TABLET | Freq: Every day | ORAL | Status: DC
  Administered 2014-08-02 – 2014-08-06 (×4): 40 mg via ORAL
  Filled 2014-08-01 (×6): qty 1

## 2014-08-01 MED ORDER — SODIUM CHLORIDE 0.9 % IV BOLUS (SEPSIS)
1000.0000 mL | Freq: Once | INTRAVENOUS | Status: AC
  Administered 2014-08-01: 1000 mL via INTRAVENOUS

## 2014-08-01 MED ORDER — ONDANSETRON HCL 4 MG/2ML IJ SOLN
4.0000 mg | Freq: Four times a day (QID) | INTRAMUSCULAR | Status: DC | PRN
Start: 2014-08-01 — End: 2014-08-08
  Administered 2014-08-01 – 2014-08-02 (×2): 4 mg via INTRAVENOUS
  Administered 2014-08-02: 8 mg via INTRAVENOUS
  Administered 2014-08-03: 4 mg via INTRAVENOUS
  Filled 2014-08-01: qty 4
  Filled 2014-08-01 (×2): qty 2

## 2014-08-01 MED ORDER — FENTANYL CITRATE (PF) 100 MCG/2ML IJ SOLN
50.0000 ug | Freq: Once | INTRAMUSCULAR | Status: AC
  Administered 2014-08-01: 50 ug via INTRAVENOUS
  Filled 2014-08-01: qty 2

## 2014-08-01 MED ORDER — PROCHLORPERAZINE EDISYLATE 5 MG/ML IJ SOLN
10.0000 mg | Freq: Four times a day (QID) | INTRAMUSCULAR | Status: DC | PRN
  Filled 2014-08-01: qty 2

## 2014-08-01 MED ORDER — SODIUM CHLORIDE 0.9 % IV SOLN
3.0000 g | Freq: Four times a day (QID) | INTRAVENOUS | Status: DC
  Filled 2014-08-01 (×2): qty 3

## 2014-08-01 MED ORDER — MORPHINE SULFATE 4 MG/ML IJ SOLN
4.0000 mg | INTRAMUSCULAR | Status: DC | PRN
Start: 2014-08-01 — End: 2014-08-05
  Administered 2014-08-01 – 2014-08-05 (×15): 4 mg via INTRAVENOUS
  Filled 2014-08-01 (×16): qty 1

## 2014-08-01 MED ORDER — METOPROLOL TARTRATE 25 MG PO TABS
25.0000 mg | ORAL_TABLET | Freq: Two times a day (BID) | ORAL | Status: DC
  Administered 2014-08-02 – 2014-08-08 (×12): 25 mg via ORAL
  Filled 2014-08-01 (×18): qty 1

## 2014-08-01 MED ORDER — ONDANSETRON HCL 4 MG/2ML IJ SOLN
4.0000 mg | Freq: Once | INTRAMUSCULAR | Status: AC
  Administered 2014-08-01: 4 mg via INTRAVENOUS
  Filled 2014-08-01: qty 2

## 2014-08-01 MED ORDER — SODIUM CHLORIDE 0.9 % IV SOLN
3.0000 g | Freq: Once | INTRAVENOUS | Status: AC
  Administered 2014-08-01: 3 g via INTRAVENOUS
  Filled 2014-08-01: qty 3

## 2014-08-01 MED ORDER — CEFTRIAXONE SODIUM IN DEXTROSE 40 MG/ML IV SOLN
2.0000 g | INTRAVENOUS | Status: DC
  Administered 2014-08-01 – 2014-08-08 (×8): 2 g via INTRAVENOUS
  Filled 2014-08-01 (×8): qty 50

## 2014-08-01 MED ORDER — ONDANSETRON HCL 4 MG PO TABS: 4.0000 mg | ORAL_TABLET | Freq: Three times a day (TID) | ORAL | Status: DC | PRN

## 2014-08-01 MED ORDER — HEPARIN SODIUM (PORCINE) 5000 UNIT/ML IJ SOLN
5000.0000 [IU] | Freq: Three times a day (TID) | INTRAMUSCULAR | Status: DC
  Administered 2014-08-01 – 2014-08-03 (×6): 5000 [IU] via SUBCUTANEOUS
  Filled 2014-08-01 (×8): qty 1

## 2014-08-01 MED ORDER — ONDANSETRON HCL 4 MG/2ML IJ SOLN
4.0000 mg | Freq: Four times a day (QID) | INTRAMUSCULAR | Status: DC | PRN
  Administered 2014-08-01: 4 mg via INTRAVENOUS
  Filled 2014-08-01: qty 2

## 2014-08-01 MED ORDER — SODIUM CHLORIDE 0.9 % IV SOLN
INTRAVENOUS | Status: DC
  Administered 2014-08-01 – 2014-08-02 (×4): via INTRAVENOUS

## 2014-08-01 MED ORDER — CLONAZEPAM 0.5 MG PO TABS
0.5000 mg | ORAL_TABLET | Freq: Two times a day (BID) | ORAL | Status: DC | PRN
  Administered 2014-08-03 – 2014-08-05 (×4): 0.5 mg via ORAL
  Filled 2014-08-01 (×4): qty 1

## 2014-08-01 MED ORDER — ASPIRIN EC 81 MG PO TBEC
81.0000 mg | DELAYED_RELEASE_TABLET | Freq: Every day | ORAL | Status: DC
  Administered 2014-08-02 – 2014-08-08 (×6): 81 mg via ORAL
  Filled 2014-08-01 (×8): qty 1

## 2014-08-01 MED ORDER — HYDRALAZINE HCL 20 MG/ML IJ SOLN
20.0000 mg | INTRAMUSCULAR | Status: DC | PRN
  Administered 2014-08-01 – 2014-08-06 (×2): 20 mg via INTRAVENOUS
  Filled 2014-08-01 (×2): qty 1

## 2014-08-01 NOTE — Progress Notes (Signed)
Patient seen and examined. Admitted to me 9 secondary to abdominal pain, nausea and vomiting. Workup demonstrating acute pancreatitis with concerns for a retained stone in his CBD and potential cholecystitis. Patient remains sick in his stomach, with nausea and vomiting. No fever and normal WBCs. For further details of/information on admission please refer to H&P written by Dr. Alcario Drought  Plan -Continue bowel rest -Will continue as needed Zofran and Compazine for refractory seen today for nausea and vomiting. -Will follow recommendations from GI and general surgery -Patient on Rocephin empirically -Might required ERCP   Barton Dubois [phone]

## 2014-08-01 NOTE — Progress Notes (Signed)
[facility] Consult: 12:29 PM 08/01/2014  LOS: 0 days    Referring Provider: Dr Hurley Cisco  Primary Care Physician:  Anthoney Harada, MD Primary Gastroenterologist:  Dr. Delfin Edis     Reason for Consultation:  Acute pancreatitis   HPI: Tyler Mayo is a 74 y.o. male.  Hx CAD, STEMI 2013 s/p DES to RCA and RCA on Effient,  dementia, low grade lymphoma of lung, chronic back pain, seizure d/o, depression, admitted with pancreatitis. He developed fairly sudden and severe mid and upper abdominal pain at home. Symptoms started 07/31/2014 at around 7 PM. This was associated with nausea and vomiting. He had multiple episodes of vomiting at home, non-bloody emesis. Never had pain like this before. Denies constipation, diarrhea, rectal bleeding or melena.  Recently diagnosed with gallstones and set to see surgeon on Tuesday 4/26.   LFTs in 400s/300s.  Lipase >3000.  Ultrasound 4/24 confirms sludge, stones in GB, fatty liver.  Biliary tree not dilated.  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 No recent alcohol use    Past Medical History  Diagnosis Date  . Lung nodules     s/p bx with Dr. 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 CAD (coronary artery disease)     a.  8.2012 neg MV;  b.  05/12/2011 Inf STEMI - 100% RCA - 3.5x22 Resolute DES, Residual 80% LAD dzs. c. s/p planned DES to LAD 06/01/11.  Marland Kitchen Hyperlipidemia     takes Crestor daily  . History of tobacco abuse   . Joint pain   . Chronic back pain   . Bruises easily     takes Effient daily  . Enlarged prostate   . Depression with anxiety     takes Vibryd daily and Clonazepam prn; dx with PTSD and sees Dr.McKinney  . Erectile dysfunction   . Insomnia    takes an OTC sleep aide  . Carotid stenosis     dopplers 9/13:  RICA 40-59%  . Memory deficit 09/17/2013  . Seizures     Past Surgical History  Procedure Laterality Date  . Tonsillectomy    . Adenoidectomy    . Ankle fracture surgery    . Cervical disc surgery      3 level fusion with Cadaver Simona Huh June 2009 successful.  No complications  . Fiberoptic bronchoscopy with endobronchial  09/23/2010  . L1 vertebroplasty and l1 core biopsy.  08/18/2010  . Right thigh abscess incision and drainage.  05/04/2010  . Right inguinal hernia repair with extended prolene hernia  12/24/2008  . Decompressive anterior cervical diskectomy, c5-c6 and c6-c7.  08/20/2007  . Cardiac catheterization    . Cardiac stents      2 stents   . Colonoscopy    . Cataracts removed      bilateral  . Right lung biopsy    . Left heart catheterization with coronary angiogram N/A 05/12/2011    Procedure: LEFT HEART CATHETERIZATION WITH  CORONARY ANGIOGRAM;  Surgeon: Sinclair Grooms, MD;  Location: [facility] CATH LAB;  Service: Cardiovascular;  Laterality: N/A;  . Percutaneous coronary stent intervention (pci-s) Right 05/12/2011    Procedure: PERCUTANEOUS CORONARY STENT INTERVENTION (PCI-S);  Surgeon: Sinclair Grooms, MD;  Location: [facility] CATH LAB;  Service: Cardiovascular;  Laterality: Right;  . Percutaneous coronary stent intervention (pci-s) N/A 06/01/2011    Procedure: PERCUTANEOUS CORONARY STENT INTERVENTION (PCI-S);  Surgeon: Sherren Mocha, MD;  Location: [facility] CATH LAB;  Service: Cardiovascular;  Laterality: N/A;    Prior to Admission medications   Medication Sig Start Date End Date Taking? Authorizing Provider  aspirin EC 81 MG tablet Take 81 mg by mouth daily.   Yes Historical Provider, MD  clonazePAM (KLONOPIN) 0.5 MG tablet Take 0.5 mg by mouth 2 (two) times daily as needed for anxiety.  12/24/11  Yes Shanker Kristeen Mans, MD  diphenhydrAMINE (BENADRYL) 25 MG tablet Take 25 mg by mouth every 6 (six) hours as needed for  itching or allergies.    Yes Historical Provider, MD  EFFIENT 10 MG TABS tablet TAKE 1 TABLET (10 MG TOTAL) BY MOUTH DAILY. 02/23/14  Yes Josue Hector, MD  HYDROcodone-acetaminophen (NORCO/VICODIN) 5-325 MG per tablet Take 1 tablet by mouth every 8 (eight) hours as needed for pain (take 1 or 2 tablets 3 times daily as needed ). 03/03/12  Yes Melrose Nakayama, MD  metoprolol tartrate (LOPRESSOR) 25 MG tablet TAKE 1/2 TABLET (12.5 MG TOTAL) BY MOUTH 2 (TWO) TIMES DAILY. 05/19/14  Yes Josue Hector, MD  ondansetron (ZOFRAN) 4 MG tablet Take 1 tablet (4 mg total) by mouth every 8 (eight) hours as needed for nausea or vomiting. 07/28/14  Yes Tori Milks, MD  VIIBRYD 40 MG TABS Take 40 mg by mouth daily.  08/22/12  Yes Historical Provider, MD  nitroGLYCERIN (NITROSTAT) 0.4 MG SL tablet Place 1 tablet (0.4 mg total) under the tongue every 5 (five) minutes as needed for chest pain. 03/26/13   Josue Hector, MD    Scheduled Meds: . aspirin EC  81 mg Oral Daily  . cefTRIAXone (ROCEPHIN)  IV  2 g Intravenous Q24H  . heparin  5,000 Units Subcutaneous 3 times per day  . metoprolol tartrate  25 mg Oral BID  . Vilazodone HCl  40 mg Oral Daily   Infusions: . sodium chloride 125 mL/hr at 08/01/14 1047   PRN Meds: clonazePAM, hydrALAZINE, morphine injection, ondansetron (ZOFRAN) IV   Allergies as of 08/01/2014 - Review Complete 08/01/2014  Allergen Reaction Noted  . Ambien [zolpidem tartrate] Other (See Comments) 10/16/2013  . Aricept [donepezil hcl]  10/05/2013  . Lipitor [atorvastatin] Other (See Comments) 08/16/2011  . Crestor [rosuvastatin] Other (See Comments) 09/17/2013    Family History  Problem Relation Age of Onset  . Alcohol abuse Father   . Lung cancer Father   . Heart disease Mother     History   Social History  . Marital Status: Married    Spouse Name: N/A  . Number of Children: 1  . Years of Education: college   Occupational History  . 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 Smoking status: Former Smoker -- 0.50 packs/day for 25 years    Types: Cigars    Quit date: 04/10/1991  . Smokeless tobacco: Never Used  . Alcohol Use: No     Comment: ETOH free x 69yr  . Drug Use: No  . Sexual Activity: Not Currently  Other Topics Concern  . Not on file   Social History Narrative   Regular exercise   No brothers no sisters    REVIEW OF SYSTEMS: As per history of present illness, otherwise negative   PHYSICAL EXAM: Vital signs in last 24 hours: Filed Vitals:   08/01/14 0806  BP: 166/76  Pulse: 110  Temp: 98.2 F (36.8 C)  Resp: 18   Wt Readings from Last 3 Encounters:  08/01/14 204 lb 11.2 oz (92.851 kg)  07/28/14 210 lb (95.255 kg)  07/19/14 218 lb (98.884 kg)   Gen: awake, alert, obvious pain, also with nausea with dry heaves while I was present HEENT: anicteric, op clear CV: Tachycardia but regular Pulm: CTA b/l Abd: soft, moderate to severe epigastric tenderness without rebound or guarding NT/ND, hypoactive but present bowel sounds Ext: no c/c/e Neuro: nonfocal   LAB RESULTS:  Recent Labs  08/01/14 0050  WBC 7.0  HGB 14.2  HCT 42.3  PLT 394   BMET Lab Results  Component Value Date   NA 138 08/01/2014   NA 137 07/28/2014   NA 136 07/19/2014   K 3.3* 08/01/2014   K 4.4 07/28/2014   K 3.5 07/19/2014   CL 100 08/01/2014   CL 102 07/28/2014   CL 101 07/19/2014   CO2 24 08/01/2014   CO2 26 07/28/2014   CO2 26 07/19/2014   GLUCOSE 148* 08/01/2014   GLUCOSE 110* 07/28/2014   GLUCOSE 109* 07/19/2014   BUN 9 08/01/2014   BUN 10 07/28/2014   BUN 12 07/19/2014   CREATININE 1.53* 08/01/2014   CREATININE 1.12 07/28/2014   CREATININE 1.16 07/19/2014   CALCIUM 9.1 08/01/2014   CALCIUM 9.1 07/28/2014   CALCIUM 8.8 07/19/2014   LFT  Recent Labs  08/01/14 0050  PROT 9.2*  ALBUMIN 3.5  AST 490*  ALT 329*  ALKPHOS 184*  BILITOT 3.4*   PT/INR Lab Results  Component Value Date   INR 1.11  10/16/2013   INR 1.17 12/16/2011   INR 1.09 11/29/2011       Component Value Date/Time   LIPASE >3000* 08/01/2014 0050   RADIOLOGY STUDIES: Mr Abdomen Mrcp Wo Cm  08/01/2014   CLINICAL DATA:  Abdominal pain, nausea and vomiting. Gallstones and borderline gallbladder wall thickening on prior exam.  EXAM: MRI ABDOMEN WITHOUT CONTRAST  (INCLUDING MRCP)  TECHNIQUE: Multiplanar multisequence MR imaging of the abdomen was performed. Heavily T2-weighted images of the biliary and pancreatic ducts were obtained, and three-dimensional MRCP images were rendered by post processing.  COMPARISON:  Abdominal ultrasound 08/01/2014, CT abdomen/pelvis 07/15/2014  FINDINGS: Lower chest:  Lung bases are grossly clear.  Hepatobiliary: Liver is unremarkable. Dependent gallstones are identified with mild gallbladder wall thickening predominantly at the fundus measuring 7 mm and trace pericholecystic fluid. There is an apparent 2 mm filling defect within the mid common duct image 29 series 9, although this is not reproduced on subsequent thick slab MRCP images for example image 37 series 5, and most likely represents flow artifact. MRCP is suboptimal for characterization of possible stones of this small size, however. Common duct dilatation is identified, with tapering to the level of the ampulla.  Pancreas: The pancreas is ill-defined but no focal mass or pancreatic ductal dilatation is identified allowing for lack of contrast. There is peripancreatic edema/fluid identified, tracking posteriorly, image 28 series 9.  Spleen: Normal  Adrenals/Urinary Tract: The adrenal glands are normal. Kidneys are grossly unremarkable but not fully evaluated on all sequences.  Stomach/Bowel:  Bowel is unremarkable.  Vascular/Lymphatic: No lymphadenopathy.  Other: Small amount of ascites.  Musculoskeletal: Normal  IMPRESSION: Gallstones with mild gallbladder wall thickening and trace pericholecystic fluid, highly suspicious for acute  cholecystitis. Probable artifactual 2 mm filling defect within the mid common duct, not reproduced on dedicated MRCP images as described above, although the sensitivity and specificity of MRCP is limited for stones of this inherently small potential size. No common duct dilatation is identified.  Edema around the pancreatic tail with indistinctness of the pancreatic body/tail margin. Correlate with amylase and lipase for signs of early pancreatitis.   Electronically Signed   By: Conchita Paris M.D.   On: 08/01/2014 11:15   US Abdomen Limited  08/01/2014   CLINICAL DATA:  Subacute onset of abdominal pain, nausea and vomiting. Initial encounter.  EXAM: US ABDOMEN LIMITED - RIGHT UPPER QUADRANT  COMPARISON:  CT of the abdomen and pelvis from 07/15/2014, and right upper quadrant ultrasound performed 07/19/2014  FINDINGS: Gallbladder:  Stones and sludge are noted partially filling the gallbladder. The gallbladder wall appears somewhat thickened, but no pericholecystic fluid is seen. No ultrasonographic Murphy's sign is elicited.  Common bile duct:  Diameter: 0.7 cm, within normal limits for the patient's age.  Liver:  No focal lesion identified. Diffusely increased parenchymal echogenicity and coarsened echotexture, likely reflecting fatty infiltration. There is question of trace fluid adjacent to the liver.  IMPRESSION: 1. Cholelithiasis and sludge noted within the gallbladder. Gallbladder wall appears somewhat thickened, without definite evidence for obstruction or cholecystitis. Common bile duct remains normal in caliber. 2. Diffuse fatty infiltration within the liver. 3. Suggestion of trace ascites adjacent to the liver.   Electronically Signed   By: Garald Balding M.D.   On: 08/01/2014 05:07    ENDOSCOPIC STUDIES: 2009  Colonoscopy screening study Sigmoid tics, internal hemorrhoids.     IMPRESSION:   *  Acute pancreatitis, likely gallstone related with epigastric pain, nausea and vomiting  *  CAD s/p  2013 multiple DES placements.  On Effient  *  Dementia.     PLAN:    1. Acute gallstone pancreatitis -- MRCP/MR abdomen images reviewed. There is common bile duct dilation but no definite CBD stone is seen. There is question of a 2 mm defect in the mid common bile duct. Repeat liver enzymes now and repeat lipase. Cannot rule out need for ERCP at this time, will follow closely clinical course and liver enzymes. --Aggressive hydration, pain control and bowel rest, anti-emetics for nausea/vomiting --Early feeding, enteral if necessary, once pain and nausea control --Hold Effient given possible need for ERCP with sphincterotomy and surgery --Surgery consulted already, will eventually need cholecystectomy likely during this hospitalization  2. ? Acute cholecystitis -- definitely pancreatitis, unclear if also cholecystitis. Surgery consulted  3. AKI -- likely volume depletion in setting of pancreatitis, aggressive hydration  Kassiah Mccrory M  08/01/2014, 12:29 PM Pager: [phone]

## 2014-08-01 NOTE — ED Provider Notes (Signed)
CSN: [id number]     Arrival date & time 08/01/14  0024 History  This chart was scribed for Linton Flemings, MD by Eustaquio Maize, ED Scribe. This patient was seen in room A07C/A07C and the patient's care was started at 12:48 AM.    Chief Complaint  Patient presents with  . Abdominal Pain  . Emesis   The history is provided by the patient. No language interpreter was used.     HPI Comments: Tyler Mayo is a 74 y.o. male brought in by ambulance, with hx CAD, HLD, and dementia who presents to the Emergency Department complaining of severe, sudden onset abdominal pain that began tonight around 7 PM (6 hours ago). Wife notes that pt also complains of nausea and vomiting. She reports that pt vomited 10 times tonight. She is unsure if pt has fever. She reports that pt has Hydrocodone prescription for a ruptured disc and took some earlier tonight but vomited 5 minutes later. Pt was recently diagnosed with gall stones and has appointment with surgeon on Tuesday, 4/26 (2 days from now).  Patient has been having ongoing abdominal pain for some time, had a CT scan done on the seventh, ultrasound on the 11th.  There was concern at that time for cholecystitis.  He was seen in the emergency department, but had negative evaluation, and was referred to surgery.  Patient had return to the emergency room for left upper quadrant pain on the 20th, but by the time he came to the emergency department pain have resolved.  Past Medical History  Diagnosis Date  . Lung nodules     s/p bx with Dr. 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 CAD (coronary artery disease)     a.  8.2012 neg MV;  b.  05/12/2011 Inf STEMI - 100% RCA - 3.5x22 Resolute DES, Residual 80% LAD dzs. c. s/p planned DES to LAD 06/01/11.  Marland Kitchen Hyperlipidemia     takes Crestor daily  . History of tobacco abuse   . Joint pain   . Chronic back pain   . Bruises easily     takes Effient daily  . Enlarged prostate   . Depression with  anxiety     takes Vibryd daily and Clonazepam prn; dx with PTSD and sees Dr.McKinney  . Erectile dysfunction   . Insomnia     takes an OTC sleep aide  . Carotid stenosis     dopplers 9/13:  RICA 40-59%  . Memory deficit 09/17/2013  . Seizures    Past Surgical History  Procedure Laterality Date  . Tonsillectomy    . Adenoidectomy    . Ankle fracture surgery    . Cervical disc surgery      3 level fusion with Cadaver Simona Huh June 2009 successful.  No complications  . Fiberoptic bronchoscopy with endobronchial  09/23/2010  . L1 vertebroplasty and l1 core biopsy.  08/18/2010  . Right thigh abscess incision and drainage.  05/04/2010  . Right inguinal hernia repair with extended prolene hernia  12/24/2008  . Decompressive anterior cervical diskectomy, c5-c6 and c6-c7.  08/20/2007  . Cardiac catheterization    . Cardiac stents      2 stents   . Colonoscopy    . Cataracts removed      bilateral  . Right lung biopsy    . Left heart catheterization with coronary angiogram N/A 05/12/2011    Procedure: LEFT HEART CATHETERIZATION WITH CORONARY ANGIOGRAM;  Surgeon: Lynnell Dike  Blenda Bridegroom, MD;  Location: [facility] CATH LAB;  Service: Cardiovascular;  Laterality: N/A;  . Percutaneous coronary stent intervention (pci-s) Right 05/12/2011    Procedure: PERCUTANEOUS CORONARY STENT INTERVENTION (PCI-S);  Surgeon: Sinclair Grooms, MD;  Location: [facility] CATH LAB;  Service: Cardiovascular;  Laterality: Right;  . Percutaneous coronary stent intervention (pci-s) N/A 06/01/2011    Procedure: PERCUTANEOUS CORONARY STENT INTERVENTION (PCI-S);  Surgeon: Sherren Mocha, MD;  Location: [facility] CATH LAB;  Service: Cardiovascular;  Laterality: N/A;   Family History  Problem Relation Age of Onset  . Alcohol abuse Father   . Lung cancer Father   . Heart disease Mother    History  Substance Use Topics  . Smoking status: Former Smoker -- 0.50 packs/day for 25 years    Types: Cigars    Quit date: 04/10/1991  . Smokeless tobacco:  Never Used  . Alcohol Use: No     Comment: ETOH free x 59yr    Review of Systems  Constitutional: Negative for fever.  HENT: Negative for rhinorrhea.   Respiratory: Negative for cough.   Cardiovascular: Negative for chest pain.  Gastrointestinal: Positive for nausea, vomiting and abdominal pain.  Skin: Negative for rash.  Neurological: Negative for speech difficulty.  Psychiatric/Behavioral: Positive for confusion.      Allergies  Ambien; Aricept; Lipitor; and Crestor  Home Medications   Prior to Admission medications   Medication Sig Start Date End Date Taking? Authorizing Provider  aspirin EC 81 MG tablet Take 81 mg by mouth daily.    Historical Provider, MD  clonazePAM (KLONOPIN) 0.5 MG tablet Take 0.5 mg by mouth 2 (two) times daily as needed for anxiety.  12/24/11   Shanker MKristeen Mans MD  diphenhydrAMINE (BENADRYL) 25 MG tablet Take 25 mg by mouth every 6 (six) hours as needed for itching or allergies.     Historical Provider, MD  EFFIENT 10 MG TABS tablet TAKE 1 TABLET (10 MG TOTAL) BY MOUTH DAILY. 02/23/14   PJosue Hector MD  HYDROcodone-acetaminophen (NORCO/VICODIN) 5-325 MG per tablet Take 1 tablet by mouth every 8 (eight) hours as needed for pain (take 1 or 2 tablets 3 times daily as needed ). 03/03/12   SMelrose Nakayama MD  metoprolol tartrate (LOPRESSOR) 25 MG tablet TAKE 1/2 TABLET (12.5 MG TOTAL) BY MOUTH 2 (TWO) TIMES DAILY. 05/19/14   PJosue Hector MD  nitroGLYCERIN (NITROSTAT) 0.4 MG SL tablet Place 1 tablet (0.4 mg total) under the tongue every 5 (five) minutes as needed for chest pain. 03/26/13   PJosue Hector MD  ondansetron (ZOFRAN) 4 MG tablet Take 1 tablet (4 mg total) by mouth every 8 (eight) hours as needed for nausea or vomiting. 07/28/14   JTori Milks MD  VIIBRYD 40 MG TABS Take 40 mg by mouth daily.  08/22/12   Historical Provider, MD   Triage Vitals: BP 162/81 mmHg  Pulse 80  Temp(Src) 97.5 F (36.4 C) (Oral)  Resp 18  Ht '5\' 10"'$  (1.778 m)   Wt 210 lb (95.255 kg)  BMI 30.13 kg/m2  SpO2 96%   Physical Exam  Constitutional: He is oriented to person, place, and time. He appears well-developed and well-nourished. He appears distressed.  HENT:  Head: Normocephalic and atraumatic.  Nose: Nose normal.  Mouth/Throat: Oropharynx is clear and moist.  Eyes: Conjunctivae and EOM are normal. Pupils are equal, round, and reactive to light.  Neck: Normal range of motion. Neck supple. No JVD present. No tracheal deviation present. No thyromegaly  present.  Cardiovascular: Normal rate, regular rhythm, normal heart sounds and intact distal pulses.  Exam reveals no gallop and no friction rub.   No murmur heard. Pulmonary/Chest: Effort normal and breath sounds normal. No stridor. No respiratory distress. He has no wheezes. He has no rales. He exhibits no tenderness.  Abdominal: Soft. Bowel sounds are normal. He exhibits no distension and no mass. There is tenderness (tenderness with palpation in right upper quadrant). There is no rebound and no guarding.  Musculoskeletal: Normal range of motion. He exhibits no edema or tenderness.  Lymphadenopathy:    He has no cervical adenopathy.  Neurological: He is alert and oriented to person, place, and time. He displays normal reflexes. He exhibits normal muscle tone. Coordination normal.  Skin: Skin is warm and dry. No rash noted. No erythema. No pallor.  Psychiatric: He has a normal mood and affect. His behavior is normal. Judgment and thought content normal.  Nursing note and vitals reviewed.   ED Course  Procedures (including critical care time)  DIAGNOSTIC STUDIES: Oxygen Saturation is 96% on RA, normal by my interpretation.    COORDINATION OF CARE: 12:54 AM-Discussed treatment plan which includes Lactic acid, CBC, CMP, Lipase, UA, and consult with general surgery with pt at bedside and pt agreed to plan.   Labs Review Labs Reviewed  CBC WITH DIFFERENTIAL/PLATELET - Abnormal; Notable for the  following:    Neutrophils Relative % 79 (*)    Lymphocytes Relative 11 (*)    All other components within normal limits  COMPREHENSIVE METABOLIC PANEL - Abnormal; Notable for the following:    Potassium 3.3 (*)    Glucose, Bld 148 (*)    Creatinine, Ser 1.53 (*)    Total Protein 9.2 (*)    AST 490 (*)    ALT 329 (*)    Alkaline Phosphatase 184 (*)    Total Bilirubin 3.4 (*)    GFR calc non Af Amer 44 (*)    GFR calc Af Amer 51 (*)    All other components within normal limits  LIPASE, BLOOD - Abnormal; Notable for the following:    Lipase >3000 (*)    All other components within normal limits  URINALYSIS, ROUTINE W REFLEX MICROSCOPIC - Abnormal; Notable for the following:    Color, Urine AMBER (*)    Hgb urine dipstick SMALL (*)    Bilirubin Urine SMALL (*)    Ketones, ur 15 (*)    Protein, ur 30 (*)    All other components within normal limits  URINE MICROSCOPIC-ADD ON - Abnormal; Notable for the following:    Casts HYALINE CASTS (*)    All other components within normal limits  I-STAT CG4 LACTIC ACID, ED - Abnormal; Notable for the following:    Lactic Acid, Venous 2.65 (*)    All other components within normal limits    Imaging Review US Abdomen Limited  08/01/2014   CLINICAL DATA:  Subacute onset of abdominal pain, nausea and vomiting. Initial encounter.  EXAM: US ABDOMEN LIMITED - RIGHT UPPER QUADRANT  COMPARISON:  CT of the abdomen and pelvis from 07/15/2014, and right upper quadrant ultrasound performed 07/19/2014  FINDINGS: Gallbladder:  Stones and sludge are noted partially filling the gallbladder. The gallbladder wall appears somewhat thickened, but no pericholecystic fluid is seen. No ultrasonographic Murphy's sign is elicited.  Common bile duct:  Diameter: 0.7 cm, within normal limits for the patient's age.  Liver:  No focal lesion identified. Diffusely increased parenchymal echogenicity and coarsened  echotexture, likely reflecting fatty infiltration. There is question  of trace fluid adjacent to the liver.  IMPRESSION: 1. Cholelithiasis and sludge noted within the gallbladder. Gallbladder wall appears somewhat thickened, without definite evidence for obstruction or cholecystitis. Common bile duct remains normal in caliber. 2. Diffuse fatty infiltration within the liver. 3. Suggestion of trace ascites adjacent to the liver.   Electronically Signed   By: Garald Balding M.D.   On: 08/01/2014 05:07     EKG Interpretation None      MDM   Final diagnoses:  Cholecystitis  Gallstone pancreatitis    I personally performed the services described in this documentation, which was scribed in my presence. The recorded information has been reviewed and is accurate.  74 year old male with known cholelithiasis presents with persistent vomiting or worsening pain.  Concern for cholecystitis.  Plan for labs, pain and nausea control.  Elevated LFTs.  Case was discussed with Dr. Hulen Skains of surgery.  He recommends medicine admission, GI consult, ERCP.  They will follow as consult.  Case was discussed with Dr. Alcario Drought who is uncomfortable admitting the patient at this time feels that he needs to be admitted by surgery or GI.  He requestsRepeat ultrasound.  Lipase has returned greater than 3000.  Awaiting ultrasound results     Linton Flemings, MD 08/01/14 [phone]

## 2014-08-01 NOTE — Progress Notes (Signed)
ANTIBIOTIC CONSULT NOTE - INITIAL  Pharmacy Consult for Unasyn Indication: Intra-abdominal Infection  Allergies  Allergen Reactions  . Ambien [Zolpidem Tartrate] Other (See Comments)    Causes severe confusion   . Aricept [Donepezil Hcl]     Nausea  . Lipitor [Atorvastatin] Other (See Comments)    Joint and muscle pain  . Crestor [Rosuvastatin] Other (See Comments)    Joint pain    Patient Measurements: Height: '5\' 10"'$  (177.8 cm) Weight: 210 lb (95.255 kg) IBW/kg (Calculated) : 73   Vital Signs: Temp: 97.5 F (36.4 C) (04/24 0034) Temp Source: Oral (04/24 0034) BP: 178/85 mmHg (04/24 0430) Pulse Rate: 91 (04/24 0430) Intake/Output from previous day:   Intake/Output from this shift:    Labs:  Recent Labs  08/01/14 0050  WBC 7.0  HGB 14.2  PLT 394  CREATININE 1.53*   Estimated Creatinine Clearance: 51.3 mL/min (by C-G formula based on Cr of 1.53). No results for input(s): VANCOTROUGH, VANCOPEAK, VANCORANDOM, GENTTROUGH, GENTPEAK, GENTRANDOM, TOBRATROUGH, TOBRAPEAK, TOBRARND, AMIKACINPEAK, AMIKACINTROU, AMIKACIN in the last 72 hours.   Microbiology: Recent Results (from the past 720 hour(s))  Urine culture     Status: None   Collection Time: 07/28/14  9:15 PM  Result Value Ref Range Status   Specimen Description URINE, CLEAN CATCH  Final   Special Requests NONE  Final   Colony Count   Final    30,000 COLONIES/ML Performed at [facility]    Culture   Final    STAPHYLOCOCCUS SPECIES (COAGULASE NEGATIVE) Note: RIFAMPIN AND GENTAMICIN SHOULD NOT BE USED AS SINGLE DRUGS FOR TREATMENT OF STAPH INFECTIONS. Performed at [facility]    Report Status 07/31/2014 FINAL  Final   Organism ID, Bacteria STAPHYLOCOCCUS SPECIES (COAGULASE NEGATIVE)  Final      Susceptibility   Staphylococcus species (coagulase negative) - MIC*    GENTAMICIN >=16 RESISTANT Resistant     LEVOFLOXACIN >=8 RESISTANT Resistant     NITROFURANTOIN <=16 SENSITIVE Sensitive     OXACILLIN >=4 RESISTANT Resistant     PENICILLIN >=0.5 RESISTANT Resistant     RIFAMPIN <=0.5 SENSITIVE Sensitive     TRIMETH/SULFA 80 RESISTANT Resistant     VANCOMYCIN 1 SENSITIVE Sensitive     TETRACYCLINE 2 SENSITIVE Sensitive     * STAPHYLOCOCCUS SPECIES (COAGULASE NEGATIVE)    Medical History: Past Medical History  Diagnosis Date  . Lung nodules     s/p bx with Dr. 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 CAD (coronary artery disease)     a.  8.2012 neg MV;  b.  05/12/2011 Inf STEMI - 100% RCA - 3.5x22 Resolute DES, Residual 80% LAD dzs. c. s/p planned DES to LAD 06/01/11.  Marland Kitchen Hyperlipidemia     takes Crestor daily  . History of tobacco abuse   . Joint pain   . Chronic back pain   . Bruises easily     takes Effient daily  . Enlarged prostate   . Depression with anxiety     takes Vibryd daily and Clonazepam prn; dx with PTSD and sees Dr.McKinney  . Erectile dysfunction   . Insomnia     takes an OTC sleep aide  . Carotid stenosis     dopplers 9/13:  RICA 40-59%  . Memory deficit 09/17/2013  . Seizures     Assessment: 74 y.o male presents to the ED complaining of severe, sudden onset abdominal pain. Pharmacy consulted to dose Unasyn for intraabdominal infection.  SCr 1.53,  estimated CrCl ~ 55m/min.   Goal of Therapy:  Appropriate antibiotic dose for patient's renal function and eradication of infection  Plan:  Unasyn 3 gm IV q6h Monitor clinical status, renal function, culture results daily.  RNicole Cella RPh Clinical Pharmacist Pager: [phone]/24/2016,5:04 AM

## 2014-08-01 NOTE — ED Notes (Signed)
Pt raising voice and requesting to speak with doctor in regards to pain medicine while this nurse assessing pt, this RN explained to pt that pain medications will be ordered as soon as pt is assessed. Pt stating "I want pain medications now, get the doctor in here now." See triage protocol order for fentanyl.

## 2014-08-01 NOTE — ED Notes (Signed)
Per EMS: pt from home for eval of increased abd pain and n/v. Pt seen here and diagnosed with gallstones, pt reports unable to keep PO zofran down today. EMS noted 1 episode of emesis en route, '4mg'$  of zofran given. Pt denies any cp or sob at this time, nad noted pt axo x4.

## 2014-08-01 NOTE — Consult Note (Signed)
[facility] Surgery Consult Note  Tyler Mayo 03-Jun-1940  [id number].    Requesting MD: Dr.Otter Chief Complaint/Reason for Consult: Abdominal pain  HPI:  74 y.o. male h/o dementia, CAD with stents on Effient, HTN, HLD, Carotid stenosis, seizures presents to the [facility] with c/o severe, sudden onset of 10/10 epigastric/LUQ abdominal pain which occurred after eating subway and having a BM.  He pain caused him to double over. Symptoms onset earlier tonight around 7pm, also had N/V with this. Patient had numerous episodes of vomiting (bilious) at home and currently. His daughter noted this had been going on since the beginning of April, but acutely worsened last night.  He has been seen in the ER 2 other times for similar symptoms.  Patient recently diagnosed with gallstones by his PCP he was given 5 days of cipro.  He returned the the ER on 07/19/14, but his pain resolved and he was sent home and told to follow up with surgery in 3-7 days.  He was pending evaluation surgeon on Tuesday of this week.  He had been pain free for the last few days until yesterday (4/23). H/o right inguinal hernia repair with prolene hernia system mesh by Dr. Donne Hazel in 2010.  Dr. Johnsie Cancel is his cardiologist most recently seen on 05/28/14.     ROS: All systems reviewed and otherwise negative except for as above   Family History  Problem Relation Age of Onset  . Alcohol abuse Father   . Lung cancer Father   . Heart disease Mother     Past Medical History  Diagnosis Date  . Lung nodules     s/p bx with Dr. 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 CAD (coronary artery disease)     a.  8.2012 neg MV;  b.  05/12/2011 Inf STEMI - 100% RCA - 3.5x22 Resolute DES, Residual 80% LAD dzs. c. s/p planned DES to LAD 06/01/11.  Marland Kitchen Hyperlipidemia     takes Crestor daily  . History of tobacco abuse   . Joint pain   . Chronic back pain   . Bruises easily     takes Effient daily  . Enlarged  prostate   . Depression with anxiety     takes Vibryd daily and Clonazepam prn; dx with PTSD and sees Dr.McKinney  . Erectile dysfunction   . Insomnia     takes an OTC sleep aide  . Carotid stenosis     dopplers 9/13:  RICA 40-59%  . Memory deficit 09/17/2013  . Seizures     Past Surgical History  Procedure Laterality Date  . Tonsillectomy    . Adenoidectomy    . Ankle fracture surgery    . Cervical disc surgery      3 level fusion with Cadaver Simona Huh June 2009 successful.  No complications  . Fiberoptic bronchoscopy with endobronchial  09/23/2010  . L1 vertebroplasty and l1 core biopsy.  08/18/2010  . Right thigh abscess incision and drainage.  05/04/2010  . Right inguinal hernia repair with extended prolene hernia  12/24/2008  . Decompressive anterior cervical diskectomy, c5-c6 and c6-c7.  08/20/2007  . Cardiac catheterization    . Cardiac stents      2 stents   . Colonoscopy    . Cataracts removed      bilateral  . Right lung biopsy    . Left heart catheterization with coronary angiogram N/A 05/12/2011    Procedure: LEFT HEART CATHETERIZATION WITH CORONARY ANGIOGRAM;  Surgeon: Sinclair Grooms, MD;  Location: [facility] CATH LAB;  Service: Cardiovascular;  Laterality: N/A;  . Percutaneous coronary stent intervention (pci-s) Right 05/12/2011    Procedure: PERCUTANEOUS CORONARY STENT INTERVENTION (PCI-S);  Surgeon: Sinclair Grooms, MD;  Location: [facility] CATH LAB;  Service: Cardiovascular;  Laterality: Right;  . Percutaneous coronary stent intervention (pci-s) N/A 06/01/2011    Procedure: PERCUTANEOUS CORONARY STENT INTERVENTION (PCI-S);  Surgeon: Sherren Mocha, MD;  Location: [facility] CATH LAB;  Service: Cardiovascular;  Laterality: N/A;    Social History:  reports that he quit smoking about 23 years ago. His smoking use included Cigars. He has never used smokeless tobacco. He reports that he does not drink alcohol or use illicit drugs.  Allergies:  Allergies  Allergen Reactions  . Ambien  [Zolpidem Tartrate] Other (See Comments)    Causes severe confusion   . Aricept [Donepezil Hcl]     Nausea  . Lipitor [Atorvastatin] Other (See Comments)    Joint and muscle pain  . Crestor [Rosuvastatin] Other (See Comments)    Joint pain    Medications Prior to Admission  Medication Sig Dispense Refill  . aspirin EC 81 MG tablet Take 81 mg by mouth daily.    . clonazePAM (KLONOPIN) 0.5 MG tablet Take 0.5 mg by mouth 2 (two) times daily as needed for anxiety.     . diphenhydrAMINE (BENADRYL) 25 MG tablet Take 25 mg by mouth every 6 (six) hours as needed for itching or allergies.     Marland Kitchen EFFIENT 10 MG TABS tablet TAKE 1 TABLET (10 MG TOTAL) BY MOUTH DAILY. 30 tablet 5  . HYDROcodone-acetaminophen (NORCO/VICODIN) 5-325 MG per tablet Take 1 tablet by mouth every 8 (eight) hours as needed for pain (take 1 or 2 tablets 3 times daily as needed ). 40 tablet 0  . metoprolol tartrate (LOPRESSOR) 25 MG tablet TAKE 1/2 TABLET (12.5 MG TOTAL) BY MOUTH 2 (TWO) TIMES DAILY. 30 tablet 6  . ondansetron (ZOFRAN) 4 MG tablet Take 1 tablet (4 mg total) by mouth every 8 (eight) hours as needed for nausea or vomiting. 12 tablet 0  . VIIBRYD 40 MG TABS Take 40 mg by mouth daily.     . nitroGLYCERIN (NITROSTAT) 0.4 MG SL tablet Place 1 tablet (0.4 mg total) under the tongue every 5 (five) minutes as needed for chest pain. 25 tablet 4    Blood pressure 166/76, pulse 110, temperature 98.2 F (36.8 C), temperature source Oral, resp. rate 18, height 5' 10"  (1.778 m), weight 92.851 kg (204 lb 11.2 oz), SpO2 94 %. Physical Exam: General: Alert when arroused, but very sleepy, appears nauseated, WD/WN white male who is laying in bed intermittently dry heaving and vomiting bile HEENT: head is normocephalic, atraumatic.  Sclera are noninjected.  PERRL.  Ears and nose without any masses or lesions.  Mouth is pink and moist Heart: Tachy, normal rhythm.  No obvious gallops or rubs noted.  Palpable pedal pulses  bilaterally Lungs: CTAB, no wheezes, rhonchi, or rales noted.  Respiratory effort nonlabored Abd: soft, distended, tender in the LUQ and epigastrium, +BS, no masses, hernias, or organomegaly, scar from right inguinal hernia not seen MS: all 4 extremities are symmetrical with no cyanosis, clubbing, or edema. Skin: warm and dry with no masses, lesions, or rashes Psych: Alert when aroused, sleepy Neuro:  Confused, not able to tell me the details of his medical history, or current problem without falling asleep   Results for orders placed or performed during  the hospital encounter of 08/01/14 (from the past 48 hour(s))  CBC with Differential     Status: Abnormal   Collection Time: 08/01/14 12:50 AM  Result Value Ref Range   WBC 7.0 4.0 - 10.5 K/uL   RBC 4.43 4.22 - 5.81 MIL/uL   Hemoglobin 14.2 13.0 - 17.0 g/dL   HCT 42.3 39.0 - 52.0 %   MCV 95.5 78.0 - 100.0 fL   MCH 32.1 26.0 - 34.0 pg   MCHC 33.6 30.0 - 36.0 g/dL   RDW 13.3 11.5 - 15.5 %   Platelets 394 150 - 400 K/uL   Neutrophils Relative % 79 (H) 43 - 77 %   Neutro Abs 5.6 1.7 - 7.7 K/uL   Lymphocytes Relative 11 (L) 12 - 46 %   Lymphs Abs 0.8 0.7 - 4.0 K/uL   Monocytes Relative 9 3 - 12 %   Monocytes Absolute 0.6 0.1 - 1.0 K/uL   Eosinophils Relative 0 0 - 5 %   Eosinophils Absolute 0.0 0.0 - 0.7 K/uL   Basophils Relative 1 0 - 1 %   Basophils Absolute 0.0 0.0 - 0.1 K/uL  Comprehensive metabolic panel     Status: Abnormal   Collection Time: 08/01/14 12:50 AM  Result Value Ref Range   Sodium 138 135 - 145 mmol/L   Potassium 3.3 (L) 3.5 - 5.1 mmol/L   Chloride 100 96 - 112 mmol/L   CO2 24 19 - 32 mmol/L   Glucose, Bld 148 (H) 70 - 99 mg/dL   BUN 9 6 - 23 mg/dL   Creatinine, Ser 1.53 (H) 0.50 - 1.35 mg/dL   Calcium 9.1 8.4 - 10.5 mg/dL   Total Protein 9.2 (H) 6.0 - 8.3 g/dL   Albumin 3.5 3.5 - 5.2 g/dL   AST 490 (H) 0 - 37 U/L   ALT 329 (H) 0 - 53 U/L   Alkaline Phosphatase 184 (H) 39 - 117 U/L   Total Bilirubin 3.4 (H)  0.3 - 1.2 mg/dL   GFR calc non Af Amer 44 (L) >90 mL/min   GFR calc Af Amer 51 (L) >90 mL/min    Comment: (NOTE) The eGFR has been calculated using the CKD EPI equation. This calculation has not been validated in all clinical situations. eGFR's persistently <90 mL/min signify possible Chronic Kidney Disease.    Anion gap 14 5 - 15  Lipase, blood     Status: Abnormal   Collection Time: 08/01/14 12:50 AM  Result Value Ref Range   Lipase >3000 (H) 11 - 59 U/L    Comment: RESULTS CONFIRMED BY MANUAL DILUTION  I-Stat CG4 Lactic Acid, ED     Status: Abnormal   Collection Time: 08/01/14  1:00 AM  Result Value Ref Range   Lactic Acid, Venous 2.65 (HH) 0.5 - 2.0 mmol/L   Comment NOTIFIED PHYSICIAN   Urinalysis, Routine w reflex microscopic     Status: Abnormal   Collection Time: 08/01/14  2:28 AM  Result Value Ref Range   Color, Urine AMBER (A) YELLOW    Comment: BIOCHEMICALS MAY BE AFFECTED BY COLOR   APPearance CLEAR CLEAR   Specific Gravity, Urine 1.012 1.005 - 1.030   pH 6.5 5.0 - 8.0   Glucose, UA NEGATIVE NEGATIVE mg/dL   Hgb urine dipstick SMALL (A) NEGATIVE   Bilirubin Urine SMALL (A) NEGATIVE   Ketones, ur 15 (A) NEGATIVE mg/dL   Protein, ur 30 (A) NEGATIVE mg/dL   Urobilinogen, UA 1.0 0.0 - 1.0 mg/dL  Nitrite NEGATIVE NEGATIVE   Leukocytes, UA NEGATIVE NEGATIVE  Urine microscopic-add on     Status: Abnormal   Collection Time: 08/01/14  2:28 AM  Result Value Ref Range   Squamous Epithelial / LPF RARE RARE   RBC / HPF 3-6 <3 RBC/hpf   Casts HYALINE CASTS (A) NEGATIVE   US Abdomen Limited  08/01/2014   CLINICAL DATA:  Subacute onset of abdominal pain, nausea and vomiting. Initial encounter.  EXAM: US ABDOMEN LIMITED - RIGHT UPPER QUADRANT  COMPARISON:  CT of the abdomen and pelvis from 07/15/2014, and right upper quadrant ultrasound performed 07/19/2014  FINDINGS: Gallbladder:  Stones and sludge are noted partially filling the gallbladder. The gallbladder wall appears  somewhat thickened, but no pericholecystic fluid is seen. No ultrasonographic Murphy's sign is elicited.  Common bile duct:  Diameter: 0.7 cm, within normal limits for the patient's age.  Liver:  No focal lesion identified. Diffusely increased parenchymal echogenicity and coarsened echotexture, likely reflecting fatty infiltration. There is question of trace fluid adjacent to the liver.  IMPRESSION: 1. Cholelithiasis and sludge noted within the gallbladder. Gallbladder wall appears somewhat thickened, without definite evidence for obstruction or cholecystitis. Common bile duct remains normal in caliber. 2. Diffuse fatty infiltration within the liver. 3. Suggestion of trace ascites adjacent to the liver.   Electronically Signed   By: Garald Balding M.D.   On: 08/01/2014 05:07      Assessment/Plan Cholelithiasis with likely Choledocholithiasis -Still quite symptomatic and lipase is >3000, WBC normal, rest of LFT's elevated, Cr. 153 -MRI questioned stone with filling defect in the mid CBD, but was not seen on MRCP, GI following, may need ERCP -IVF, pain control, antiemetics, On unasyn, but this has too much resistance, will switch to rocephin, may be able to d/c this all together if no concern for cholecystitis, hard to know with his given picture, but no leukocytosis and no fever -On heparin, SCD's -Mobilize as able, IS -Repeat labs ordered for am -May or may not be able to have his GB out while he's here.  If pain resolves and lipase normalizes then we can discuss with cardiology whether he is a surgical candidate at this time.  Effient was given yesterday (07/31/14), but currently on hold Gallstone pancreatitis - see above Fatty lilver CAD/MI with stents 2013 on Effient HTN HLD Liver nodules H/o seizures Chronic back pain H/o right inguinal hernia repair 2010   DORT, Berdie Malter, [facility] Surgery 08/01/2014, 10:38 AM Pager: [phone]

## 2014-08-01 NOTE — H&P (Signed)
[facility] History and Physical  Tyler Mayo NIO:[id number] DOB: [birth year] DOA: 08/01/2014  Referring physician: EDP PCP: Anthoney Harada, MD   Chief Complaint: Abdominal pain   HPI: Tyler Mayo is a 74 y.o. male h/o dementia, he presents to the ED with c/o severe, sudden onset abdominal pain.  Symptoms onset earlier tonight around 7pm, also had N/V with this.  Pain is epigastric.  Patient had numerous episodes of vomiting at home tonight.  Patient recently diagnosed with gallstones on 4/11 and had appointment with surgeon on Tuesday of this week.  Review of Systems: Systems reviewed.  As above, otherwise negative  Past Medical History  Diagnosis Date  . Lung nodules     s/p bx with Dr. 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 CAD (coronary artery disease)     a.  8.2012 neg MV;  b.  05/12/2011 Inf STEMI - 100% RCA - 3.5x22 Resolute DES, Residual 80% LAD dzs. c. s/p planned DES to LAD 06/01/11.  Marland Kitchen Hyperlipidemia     takes Crestor daily  . History of tobacco abuse   . Joint pain   . Chronic back pain   . Bruises easily     takes Effient daily  . Enlarged prostate   . Depression with anxiety     takes Vibryd daily and Clonazepam prn; dx with PTSD and sees Dr.McKinney  . Erectile dysfunction   . Insomnia     takes an OTC sleep aide  . Carotid stenosis     dopplers 9/13:  RICA 40-59%  . Memory deficit 09/17/2013  . Seizures    Past Surgical History  Procedure Laterality Date  . Tonsillectomy    . Adenoidectomy    . Ankle fracture surgery    . Cervical disc surgery      3 level fusion with Cadaver Simona Huh June 2009 successful.  No complications  . Fiberoptic bronchoscopy with endobronchial  09/23/2010  . L1 vertebroplasty and l1 core biopsy.  08/18/2010  . Right thigh abscess incision and drainage.  05/04/2010  . Right inguinal hernia repair with extended prolene hernia  12/24/2008  . Decompressive anterior cervical diskectomy,  c5-c6 and c6-c7.  08/20/2007  . Cardiac catheterization    . Cardiac stents      2 stents   . Colonoscopy    . Cataracts removed      bilateral  . Right lung biopsy    . Left heart catheterization with coronary angiogram N/A 05/12/2011    Procedure: LEFT HEART CATHETERIZATION WITH CORONARY ANGIOGRAM;  Surgeon: Sinclair Grooms, MD;  Location: [facility] CATH LAB;  Service: Cardiovascular;  Laterality: N/A;  . Percutaneous coronary stent intervention (pci-s) Right 05/12/2011    Procedure: PERCUTANEOUS CORONARY STENT INTERVENTION (PCI-S);  Surgeon: Sinclair Grooms, MD;  Location: [facility] CATH LAB;  Service: Cardiovascular;  Laterality: Right;  . Percutaneous coronary stent intervention (pci-s) N/A 06/01/2011    Procedure: PERCUTANEOUS CORONARY STENT INTERVENTION (PCI-S);  Surgeon: Sherren Mocha, MD;  Location: [facility] CATH LAB;  Service: Cardiovascular;  Laterality: N/A;   Social History:  reports that he quit smoking about 23 years ago. His smoking use included Cigars. He has never used smokeless tobacco. He reports that he does not drink alcohol or use illicit drugs.  Allergies  Allergen Reactions  . Ambien [Zolpidem Tartrate] Other (See Comments)    Causes severe confusion   . Aricept [Donepezil Hcl]     Nausea  . Lipitor [Atorvastatin]  Other (See Comments)    Joint and muscle pain  . Crestor [Rosuvastatin] Other (See Comments)    Joint pain    Family History  Problem Relation Age of Onset  . Alcohol abuse Father   . Lung cancer Father   . Heart disease Mother      Prior to Admission medications   Medication Sig Start Date End Date Taking? Authorizing Provider  aspirin EC 81 MG tablet Take 81 mg by mouth daily.   Yes Historical Provider, MD  clonazePAM (KLONOPIN) 0.5 MG tablet Take 0.5 mg by mouth 2 (two) times daily as needed for anxiety.  12/24/11  Yes Shanker Kristeen Mans, MD  diphenhydrAMINE (BENADRYL) 25 MG tablet Take 25 mg by mouth every 6 (six) hours as needed for itching or allergies.     Yes Historical Provider, MD  EFFIENT 10 MG TABS tablet TAKE 1 TABLET (10 MG TOTAL) BY MOUTH DAILY. 02/23/14  Yes Josue Hector, MD  HYDROcodone-acetaminophen (NORCO/VICODIN) 5-325 MG per tablet Take 1 tablet by mouth every 8 (eight) hours as needed for pain (take 1 or 2 tablets 3 times daily as needed ). 03/03/12  Yes Melrose Nakayama, MD  metoprolol tartrate (LOPRESSOR) 25 MG tablet TAKE 1/2 TABLET (12.5 MG TOTAL) BY MOUTH 2 (TWO) TIMES DAILY. 05/19/14  Yes Josue Hector, MD  ondansetron (ZOFRAN) 4 MG tablet Take 1 tablet (4 mg total) by mouth every 8 (eight) hours as needed for nausea or vomiting. 07/28/14  Yes Tori Milks, MD  VIIBRYD 40 MG TABS Take 40 mg by mouth daily.  08/22/12  Yes Historical Provider, MD  nitroGLYCERIN (NITROSTAT) 0.4 MG SL tablet Place 1 tablet (0.4 mg total) under the tongue every 5 (five) minutes as needed for chest pain. 03/26/13   Josue Hector, MD   Physical Exam: Filed Vitals:   08/01/14 0530  BP: 186/91  Pulse: 102  Temp:   Resp: 21    BP 186/91 mmHg  Pulse 102  Temp(Src) 97.5 F (36.4 C) (Oral)  Resp 21  Ht '5\' 10"'$  (1.778 m)  Wt 95.255 kg (210 lb)  BMI 30.13 kg/m2  SpO2 91%  General Appearance:    Alert, oriented, no distress, appears stated age  Head:    Normocephalic, atraumatic  Eyes:    PERRL, EOMI, sclera non-icteric        Nose:   Nares without drainage or epistaxis. Mucosa, turbinates normal  Throat:   Moist mucous membranes. Oropharynx without erythema or exudate.  Neck:   Supple. No carotid bruits.  No thyromegaly.  No lymphadenopathy.   Back:     No CVA tenderness, no spinal tenderness  Lungs:     Clear to auscultation bilaterally, without wheezes, rhonchi or rales  Chest wall:    No tenderness to palpitation  Heart:    Regular rate and rhythm without murmurs, gallops, rubs  Abdomen:     Soft, epigastric tenderness, nondistended, normal bowel sounds, no organomegaly  Genitalia:    deferred  Rectal:    deferred  Extremities:    No clubbing, cyanosis or edema.  Pulses:   2+ and symmetric all extremities  Skin:   Skin color, texture, turgor normal, no rashes or lesions  Lymph nodes:   Cervical, supraclavicular, and axillary nodes normal  Neurologic:   CNII-XII intact. Normal strength, sensation and reflexes      throughout    Labs on Admission:  Basic Metabolic Panel:  Recent Labs Lab 07/28/14 2031 08/01/14 0050  NA 137 138  K 4.4 3.3*  CL 102 100  CO2 26 24  GLUCOSE 110* 148*  BUN 10 9  CREATININE 1.12 1.53*  CALCIUM 9.1 9.1   Liver Function Tests:  Recent Labs Lab 07/28/14 2031 08/01/14 0050  AST 64* 490*  ALT 26 329*  ALKPHOS 83 184*  BILITOT 1.3* 3.4*  PROT 8.7* 9.2*  ALBUMIN 3.3* 3.5    Recent Labs Lab 07/28/14 2031 08/01/14 0050  LIPASE 41 >3000*   No results for input(s): AMMONIA in the last 168 hours. CBC:  Recent Labs Lab 07/28/14 2031 08/01/14 0050  WBC 7.0 7.0  NEUTROABS 4.7 5.6  HGB 14.0 14.2  HCT 41.9 42.3  MCV 96.1 95.5  PLT 368 394   Cardiac Enzymes: No results for input(s): CKTOTAL, CKMB, CKMBINDEX, TROPONINI in the last 168 hours.  BNP (last 3 results) No results for input(s): PROBNP in the last 8760 hours. CBG: No results for input(s): GLUCAP in the last 168 hours.  Radiological Exams on Admission: US Abdomen Limited  08/01/2014   CLINICAL DATA:  Subacute onset of abdominal pain, nausea and vomiting. Initial encounter.  EXAM: US ABDOMEN LIMITED - RIGHT UPPER QUADRANT  COMPARISON:  CT of the abdomen and pelvis from 07/15/2014, and right upper quadrant ultrasound performed 07/19/2014  FINDINGS: Gallbladder:  Stones and sludge are noted partially filling the gallbladder. The gallbladder wall appears somewhat thickened, but no pericholecystic fluid is seen. No ultrasonographic Murphy's sign is elicited.  Common bile duct:  Diameter: 0.7 cm, within normal limits for the patient's age.  Liver:  No focal lesion identified. Diffusely increased parenchymal  echogenicity and coarsened echotexture, likely reflecting fatty infiltration. There is question of trace fluid adjacent to the liver.  IMPRESSION: 1. Cholelithiasis and sludge noted within the gallbladder. Gallbladder wall appears somewhat thickened, without definite evidence for obstruction or cholecystitis. Common bile duct remains normal in caliber. 2. Diffuse fatty infiltration within the liver. 3. Suggestion of trace ascites adjacent to the liver.   Electronically Signed   By: Garald Balding M.D.   On: 08/01/2014 05:07    EKG: Independently reviewed.  Assessment/Plan Principal Problem:   Gallstone pancreatitis Active Problems:   Choledocholithiasis with acute cholecystitis   HTN (hypertension)   1. Gallstone pancreatitis - 1. Pain control 2. NPO 3. IVF 4. Spoke with Dr. Hilarie Fredrickson - he requests MRCP as bile duct is normal caliber and plans to see patient. 5. Will need ERCP if patient does have retained stone. 2. Acute cholecystitis - with gallstones in gallbladder on [place] 1. Unasyn 2. Surgery consulted by EDP 3. HTN - continue home meds and adding hydralazine PRN.  Dr. Hilarie Fredrickson has been consulted and will see patient, he requests MRCP  Code Status: Full  Family Communication: No family in room Disposition Plan: Admit to inpatient   Time spent: 70 min  GARDNER, JARED M. [facility] Pager [phone]  If 7AM-7PM, please contact the day team taking care of the patient [email or web address] Password [email or web address] 08/01/2014, 6:02 AM

## 2014-08-02 DIAGNOSIS — F0391 Unspecified dementia with behavioral disturbance: Secondary | ICD-10-CM

## 2014-08-02 DIAGNOSIS — N179 Acute kidney failure, unspecified: Secondary | ICD-10-CM | POA: Insufficient documentation

## 2014-08-02 DIAGNOSIS — F03918 Unspecified dementia, unspecified severity, with other behavioral disturbance: Secondary | ICD-10-CM | POA: Insufficient documentation

## 2014-08-02 DIAGNOSIS — E876 Hypokalemia: Secondary | ICD-10-CM

## 2014-08-02 LAB — COMPREHENSIVE METABOLIC PANEL
ALBUMIN: 3 g/dL — AB (ref 3.5–5.2)
ALT: 218 U/L — ABNORMAL HIGH (ref 0–53)
AST: 193 U/L — AB (ref 0–37)
Alkaline Phosphatase: 139 U/L — ABNORMAL HIGH (ref 39–117)
Anion gap: 12 (ref 5–15)
BUN: 7 mg/dL (ref 6–23)
CALCIUM: 8 mg/dL — AB (ref 8.4–10.5)
CO2: 22 mmol/L (ref 19–32)
Chloride: 101 mmol/L (ref 96–112)
Creatinine, Ser: 0.9 mg/dL (ref 0.50–1.35)
GFR calc Af Amer: >90 mL/min (ref >90)
GFR calc non Af Amer: 84 mL/min — ABNORMAL LOW (ref >90)
Glucose, Bld: 92 mg/dL (ref 70–99)
POTASSIUM: 3.3 mmol/L — AB (ref 3.5–5.1)
SODIUM: 135 mmol/L (ref 135–145)
Total Bilirubin: 1.5 mg/dL — ABNORMAL HIGH (ref 0.3–1.2)
Total Protein: 8.3 g/dL (ref 6.0–8.3)

## 2014-08-02 LAB — LIPASE, BLOOD: Lipase: 370 U/L — ABNORMAL HIGH (ref 11–59)

## 2014-08-02 LAB — CBC
HCT: 43.9 % (ref 39.0–52.0)
Hemoglobin: 14.7 g/dL (ref 13.0–17.0)
MCH: 32.2 pg (ref 26.0–34.0)
MCHC: 33.5 g/dL (ref 30.0–36.0)
MCV: 96.3 fL (ref 78.0–100.0)
Platelets: 342 10*3/uL (ref 150–400)
RBC: 4.56 MIL/uL (ref 4.22–5.81)
RDW: 13.9 % (ref 11.5–15.5)
WBC: 11.1 10*3/uL — ABNORMAL HIGH (ref 4.0–10.5)

## 2014-08-02 LAB — LACTIC ACID, PLASMA: LACTIC ACID, VENOUS: 1.2 mmol/L (ref 0.5–2.0)

## 2014-08-02 MED ORDER — BOOST / RESOURCE BREEZE PO LIQD
1.0000 | Freq: Three times a day (TID) | ORAL | Status: DC
Start: 2014-08-02 — End: 2014-08-05
  Administered 2014-08-02 – 2014-08-03 (×3): 1 via ORAL

## 2014-08-02 MED ORDER — POTASSIUM CHLORIDE 10 MEQ/100ML IV SOLN
10.0000 meq | INTRAVENOUS | Status: DC
  Filled 2014-08-02 (×2): qty 100

## 2014-08-02 NOTE — Progress Notes (Signed)
Daily Rounding Note  08/02/2014, 10:18 AM  LOS: 1 day   SUBJECTIVE:       Still with significant abdominal pain bil.  No nausea.  Feels terrible.  Still NPO, IVF at 125/hour.  Urine output was 400 cc yesterday.  Total I/O yesterday was 2.7 litres.   OBJECTIVE:         Vital signs in last 24 hours:    Temp:  [97.3 F (36.3 C)-98.6 F (37 C)] 98.6 F (37 C) (04/25 0926) Pulse Rate:  [101-123] 113 (04/25 0926) Resp:  [18] 18 (04/25 0926) BP: (140-168)/(62-91) 155/62 mmHg (04/25 0926) SpO2:  [92 %-95 %] 92 % (04/25 0926) Weight:  [205 lb 12.8 oz (93.35 kg)] 205 lb 12.8 oz (93.35 kg) (04/24 2057) Last BM Date: 07/31/14 Filed Weights   08/01/14 0029 08/01/14 8099 08/01/14 2057  Weight: 210 lb (95.255 kg) 204 lb 11.2 oz (92.851 kg) 205 lb 12.8 oz (93.35 kg)   General: ill, slightly diaphoretic.     Heart: tachy Chest: some crackles in bases.  Shallow breathing Abdomen: soft, tender bil in upper to mid levels.  BS present.  Soft.    GU: condomecath in place Extremities: no CCE Neuro/Psych:  Oriented to name, DOB but thinks he is at home and unable to give answer to 5+5 nor to even guess as to year or date.  Lethargic, is following commands. Moving all 4 limbs.     Intake/Output from previous day: 04/24 0701 - 04/25 0700 In: 3159.6 [P.O.:240; I.V.:2869.6; IV Piggyback:50] Out: 400 [Urine:400]  Intake/Output this shift:    Lab Results:  Recent Labs  08/01/14 0050 08/02/14 0612  WBC 7.0 11.1*  HGB 14.2 14.7  HCT 42.3 43.9  PLT 394 342   BMET  Recent Labs  08/01/14 0050 08/02/14 0612  NA 138 135  K 3.3* 3.3*  CL 100 101  CO2 24 22  GLUCOSE 148* 92  BUN 9 7  CREATININE 1.53* 0.90  CALCIUM 9.1 8.0*   LFT  Recent Labs  08/01/14 0050 08/01/14 1210 08/02/14 0612  PROT 9.2* 8.2 8.3  ALBUMIN 3.5 3.1* 3.0*  AST 490* 376* 193*  ALT 329* 289* 218*  ALKPHOS 184* 180* 139*  BILITOT 3.4* 3.2* 1.5*    BILIDIR  --  1.7*  --   IBILI  --  1.5*  --    Lactic acid 4/24:  2.6, up from 1.7 4 days ago.    Studies/Results: Mr Abdomen Mrcp Wo Cm  08/01/2014   CLINICAL DATA:  Abdominal pain, nausea and vomiting. Gallstones and borderline gallbladder wall thickening on prior exam.  EXAM: MRI ABDOMEN WITHOUT CONTRAST  (INCLUDING MRCP)  TECHNIQUE: Multiplanar multisequence MR imaging of the abdomen was performed. Heavily T2-weighted images of the biliary and pancreatic ducts were obtained, and three-dimensional MRCP images were rendered by post processing.  COMPARISON:  Abdominal ultrasound 08/01/2014, CT abdomen/pelvis 07/15/2014  FINDINGS: Lower chest:  Lung bases are grossly clear.  Hepatobiliary: Liver is unremarkable. Dependent gallstones are identified with mild gallbladder wall thickening predominantly at the fundus measuring 7 mm and trace pericholecystic fluid. There is an apparent 2 mm filling defect within the mid common duct image 29 series 9, although this is not reproduced on subsequent thick slab MRCP images for example image 37 series 5, and most likely represents flow artifact. MRCP is suboptimal for characterization of possible stones of this small size, however. Common duct dilatation is identified, with tapering  to the level of the ampulla.  Pancreas: The pancreas is ill-defined but no focal mass or pancreatic ductal dilatation is identified allowing for lack of contrast. There is peripancreatic edema/fluid identified, tracking posteriorly, image 28 series 9.  Spleen: Normal  Adrenals/Urinary Tract: The adrenal glands are normal. Kidneys are grossly unremarkable but not fully evaluated on all sequences.  Stomach/Bowel: Bowel is unremarkable.  Vascular/Lymphatic: No lymphadenopathy.  Other: Small amount of ascites.  Musculoskeletal: Normal  IMPRESSION: Gallstones with mild gallbladder wall thickening and trace pericholecystic fluid, highly suspicious for acute cholecystitis. Probable artifactual 2  mm filling defect within the mid common duct, not reproduced on dedicated MRCP images as described above, although the sensitivity and specificity of MRCP is limited for stones of this inherently small potential size. No common duct dilatation is identified.  Edema around the pancreatic tail with indistinctness of the pancreatic body/tail margin. Correlate with amylase and lipase for signs of early pancreatitis.   Electronically Signed   By: Conchita Paris M.D.   On: 08/01/2014 11:15   US Abdomen Limited  08/01/2014   CLINICAL DATA:  Subacute onset of abdominal pain, nausea and vomiting. Initial encounter.  EXAM: US ABDOMEN LIMITED - RIGHT UPPER QUADRANT  COMPARISON:  CT of the abdomen and pelvis from 07/15/2014, and right upper quadrant ultrasound performed 07/19/2014  FINDINGS: Gallbladder:  Stones and sludge are noted partially filling the gallbladder. The gallbladder wall appears somewhat thickened, but no pericholecystic fluid is seen. No ultrasonographic Murphy's sign is elicited.  Common bile duct:  Diameter: 0.7 cm, within normal limits for the patient's age.  Liver:  No focal lesion identified. Diffusely increased parenchymal echogenicity and coarsened echotexture, likely reflecting fatty infiltration. There is question of trace fluid adjacent to the liver.  IMPRESSION: 1. Cholelithiasis and sludge noted within the gallbladder. Gallbladder wall appears somewhat thickened, without definite evidence for obstruction or cholecystitis. Common bile duct remains normal in caliber. 2. Diffuse fatty infiltration within the liver. 3. Suggestion of trace ascites adjacent to the liver.   Electronically Signed   By: Garald Balding M.D.   On: 08/01/2014 05:07   Scheduled Meds: . aspirin EC  81 mg Oral Daily  . cefTRIAXone (ROCEPHIN)  IV  2 g Intravenous Q24H  . heparin  5,000 Units Subcutaneous 3 times per day  . metoprolol tartrate  25 mg Oral BID  . Vilazodone HCl  40 mg Oral Daily   Continuous  Infusions: . sodium chloride 125 mL/hr at 08/02/14 0505   PRN Meds:.clonazePAM, hydrALAZINE, morphine injection, ondansetron (ZOFRAN) IV, prochlorperazine   ASSESMENT:   *  Acute gallstone pancreatitis. MRCP with dilated CBD, 2 mm mid CBD defect on MRI but not on MRCP, but no definite stone IVF at 154m/hour.  Urine output low at 400 cc though BUN/creat improved. .  Lipase rapidly dropping along with LFTs. However clinically still looks poorly.  As of yesterday, lactic acid rising.    * ?Acute cholecystitis.  On Rocephin.    *  CAD, hx [id number] stent.  Chronic Effient on hold.  *  AKI.  Improved. Oliguric as of yesterday.   *  Hypokalemia.   *  Fatty liver, trace ascites.     PLAN   *  Surgery wishes for pt to remain NPO.  Wonder about post pyloric feeding?   *  Change IVF to 150/hour.  (echo 10/2013 with normal EF 55-60% and grade 1 diastolic dysfunction, no current resp distress and currently tachy) .   *  Defer correction of potassium to hospitalist.  *  CBC and CMET in AM.      Tyler Mayo  08/02/2014, 10:18 AM Pager: [phone]

## 2014-08-02 NOTE — Progress Notes (Signed)
08/02/2014 4:53 AM  Patient given water by another staff member who was unfamiliar with his diet status. Patient drank 240 mL of ice water then immediately became nausea and have abdominal pain. Medication was given (check eMAR). Will inform day shift staff of incident. Will continue to assess and monitor patient.   [employer], RN-BC, [employer] [facility] [address] Phone [phone]

## 2014-08-02 NOTE — Progress Notes (Signed)
Patient ID: Tyler Mayo, male   DOB: 09-15-1940, 74 y.o.   MRN: [id number]    Subjective: Pt feels "shitty" this morning.  Moving around in bed as if he is a little agitated or having a fair amount of pain.  He has some nausea.  Objective: Vital signs in last 24 hours: Temp:  [97.3 F (36.3 C)-98.6 F (37 C)] 98.6 F (37 C) (04/25 0926) Pulse Rate:  [101-123] 113 (04/25 0926) Resp:  [18] 18 (04/25 0926) BP: (140-168)/(62-91) 155/62 mmHg (04/25 0926) SpO2:  [92 %-95 %] 92 % (04/25 0926) Weight:  [93.35 kg (205 lb 12.8 oz)] 93.35 kg (205 lb 12.8 oz) (04/24 2057) Last BM Date: 07/31/14  Intake/Output from previous day: 04/24 0701 - 04/25 0700 In: 3159.6 [P.O.:240; I.V.:2869.6; IV Piggyback:50] Out: 400 [Urine:400] Intake/Output this shift: Total I/O In: 610.4 [I.V.:610.4] Out: -   PE: Abd: soft, tender in the LUQ, some BS, ND Heart: tachycardic  Lungs: CTAB  Lab Results:   Recent Labs  08/01/14 0050 08/02/14 0612  WBC 7.0 11.1*  HGB 14.2 14.7  HCT 42.3 43.9  PLT 394 342   BMET  Recent Labs  08/01/14 0050 08/02/14 0612  NA 138 135  K 3.3* 3.3*  CL 100 101  CO2 24 22  GLUCOSE 148* 92  BUN 9 7  CREATININE 1.53* 0.90  CALCIUM 9.1 8.0*   PT/INR No results for input(s): LABPROT, INR in the last 72 hours. CMP     Component Value Date/Time   NA 135 08/02/2014 0612   NA 137 02/19/2014 1116   K 3.3* 08/02/2014 0612   K 4.2 02/19/2014 1116   CL 101 08/02/2014 0612   CL 106 08/28/2012 1138   CO2 22 08/02/2014 0612   CO2 27 02/19/2014 1116   GLUCOSE 92 08/02/2014 0612   GLUCOSE 104 02/19/2014 1116   GLUCOSE 130* 08/28/2012 1138   BUN 7 08/02/2014 0612   BUN 12.7 02/19/2014 1116   CREATININE 0.90 08/02/2014 0612   CREATININE 1.1 02/19/2014 1116   CREATININE 1.30 07/29/2012 0219   CALCIUM 8.0* 08/02/2014 0612   CALCIUM 9.7 02/19/2014 1116   PROT 8.3 08/02/2014 0612   PROT 9.1* 02/19/2014 1116   ALBUMIN 3.0* 08/02/2014 0612   ALBUMIN 3.8 02/19/2014  1116   AST 193* 08/02/2014 0612   AST 35* 02/19/2014 1116   ALT 218* 08/02/2014 0612   ALT 25 02/19/2014 1116   ALKPHOS 139* 08/02/2014 0612   ALKPHOS 76 02/19/2014 1116   BILITOT 1.5* 08/02/2014 0612   BILITOT 0.98 02/19/2014 1116   GFRNONAA 84* 08/02/2014 0612   GFRAA >90 08/02/2014 0612   Lipase     Component Value Date/Time   LIPASE 370* 08/02/2014 0612       Studies/Results: Mr Abdomen Mrcp Wo Cm  08/01/2014   CLINICAL DATA:  Abdominal pain, nausea and vomiting. Gallstones and borderline gallbladder wall thickening on prior exam.  EXAM: MRI ABDOMEN WITHOUT CONTRAST  (INCLUDING MRCP)  TECHNIQUE: Multiplanar multisequence MR imaging of the abdomen was performed. Heavily T2-weighted images of the biliary and pancreatic ducts were obtained, and three-dimensional MRCP images were rendered by post processing.  COMPARISON:  Abdominal ultrasound 08/01/2014, CT abdomen/pelvis 07/15/2014  FINDINGS: Lower chest:  Lung bases are grossly clear.  Hepatobiliary: Liver is unremarkable. Dependent gallstones are identified with mild gallbladder wall thickening predominantly at the fundus measuring 7 mm and trace pericholecystic fluid. There is an apparent 2 mm filling defect within the mid common duct image 29  series 9, although this is not reproduced on subsequent thick slab MRCP images for example image 37 series 5, and most likely represents flow artifact. MRCP is suboptimal for characterization of possible stones of this small size, however. Common duct dilatation is identified, with tapering to the level of the ampulla.  Pancreas: The pancreas is ill-defined but no focal mass or pancreatic ductal dilatation is identified allowing for lack of contrast. There is peripancreatic edema/fluid identified, tracking posteriorly, image 28 series 9.  Spleen: Normal  Adrenals/Urinary Tract: The adrenal glands are normal. Kidneys are grossly unremarkable but not fully evaluated on all sequences.  Stomach/Bowel:  Bowel is unremarkable.  Vascular/Lymphatic: No lymphadenopathy.  Other: Small amount of ascites.  Musculoskeletal: Normal  IMPRESSION: Gallstones with mild gallbladder wall thickening and trace pericholecystic fluid, highly suspicious for acute cholecystitis. Probable artifactual 2 mm filling defect within the mid common duct, not reproduced on dedicated MRCP images as described above, although the sensitivity and specificity of MRCP is limited for stones of this inherently small potential size. No common duct dilatation is identified.  Edema around the pancreatic tail with indistinctness of the pancreatic body/tail margin. Correlate with amylase and lipase for signs of early pancreatitis.   Electronically Signed   By: Conchita Paris M.D.   On: 08/01/2014 11:15   US Abdomen Limited  08/01/2014   CLINICAL DATA:  Subacute onset of abdominal pain, nausea and vomiting. Initial encounter.  EXAM: US ABDOMEN LIMITED - RIGHT UPPER QUADRANT  COMPARISON:  CT of the abdomen and pelvis from 07/15/2014, and right upper quadrant ultrasound performed 07/19/2014  FINDINGS: Gallbladder:  Stones and sludge are noted partially filling the gallbladder. The gallbladder wall appears somewhat thickened, but no pericholecystic fluid is seen. No ultrasonographic Murphy's sign is elicited.  Common bile duct:  Diameter: 0.7 cm, within normal limits for the patient's age.  Liver:  No focal lesion identified. Diffusely increased parenchymal echogenicity and coarsened echotexture, likely reflecting fatty infiltration. There is question of trace fluid adjacent to the liver.  IMPRESSION: 1. Cholelithiasis and sludge noted within the gallbladder. Gallbladder wall appears somewhat thickened, without definite evidence for obstruction or cholecystitis. Common bile duct remains normal in caliber. 2. Diffuse fatty infiltration within the liver. 3. Suggestion of trace ascites adjacent to the liver.   Electronically Signed   By: Garald Balding M.D.    On: 08/01/2014 05:07    Anti-infectives: Anti-infectives    Start     Dose/Rate Route Frequency Ordered Stop   08/01/14 1200  Ampicillin-Sulbactam (UNASYN) 3 g in sodium chloride 0.9 % 100 mL IVPB  Status:  Discontinued     3 g 100 mL/hr over 60 Minutes Intravenous Every 6 hours 08/01/14 0511 08/01/14 1051   08/01/14 1200  cefTRIAXone (ROCEPHIN) 2 g in dextrose 5 % 50 mL IVPB - Premix     2 g 100 mL/hr over 30 Minutes Intravenous Every 24 hours 08/01/14 1051     08/01/14 0530  Ampicillin-Sulbactam (UNASYN) 3 g in sodium chloride 0.9 % 100 mL IVPB     3 g 100 mL/hr over 60 Minutes Intravenous  Once 08/01/14 0502 08/01/14 0641       Assessment/Plan  1. Gallstone pancreatitis, ? Acute cholecystitis -on rocephin -MRCP suggests no CBD stone and LFTs are trending down to support this as well. -patient appears very uncomfortable today and says he has some nausea.  GI has placed him on clear liquids.  Apparently, some of the newer studies say that early feeding helps  resolve pancreatitis.  Will d/w Dr. Georgette Dover. -would like to plan for lap chole when pancreatitis resolves. -follow closely right now as he doesn't appear well right now.  His lactic acid was elevated yesterday at 2.65.  Will recheck this.   LOS: 1 day    Antwoine Zorn E 08/02/2014, 11:32 AM Pager: [phone]

## 2014-08-02 NOTE — Progress Notes (Signed)
INITIAL NUTRITION ASSESSMENT  DOCUMENTATION CODES Per approved criteria  -Not Applicable   INTERVENTION: Provide Resource Breeze po TID, each supplement provides 250 kcal and 9 grams of protein.  Encourage adequate PO intake.  NUTRITION DIAGNOSIS: Increased nutrient needs related to pancreatitis as evidenced by estimated nutrition needs.   Goal: Pt to meet >/= 90% of their estimated nutrition needs   Monitor:  PO intake, weight trends, labs, I/O's  Reason for Assessment: MST  74 y.o. male  Admitting Dx: Gallstone pancreatitis  ASSESSMENT: Pt h/o dementia, he presents to the ED with c/o severe, sudden onset abdominal pain. Pt with n/v and recently diagnosed with gallstones on 4/11.  Pt was asleep during time of visit and would not wake. Wife at beside. She reports pt has been having an varied appetite since April 2nd due to abdominal pains from the gallstones. She reports pt has been consuming ~2 meals a day along with an Ensure every other day. She reports usual body weight of ~210 lbs, however per Epic weight records, pt with a 10% weight loss in 5 months. Pt has been advanced to a clear liquid diet. RD to order Resource Breeze to aid in caloric and protein needs.   Pt with no observed significant fat or muscle mas loss.  Labs: Low potassium, calcium, and GFR. High ALT, AST, alkaline phosphatase, total bilirubin.   Height: Ht Readings from Last 1 Encounters:  08/01/14 '5\' 10"'$  (1.778 m)    Weight: Wt Readings from Last 1 Encounters:  08/01/14 205 lb 12.8 oz (93.35 kg)    Ideal Body Weight: 166 lbs  % Ideal Body Weight: 123%  Wt Readings from Last 10 Encounters:  08/01/14 205 lb 12.8 oz (93.35 kg)  07/28/14 210 lb (95.255 kg)  07/19/14 218 lb (98.884 kg)  05/28/14 218 lb (98.884 kg)  03/01/14 228 lb 8 oz (103.647 kg)  10/30/13 230 lb (104.327 kg)  10/19/13 233 lb (105.688 kg)  10/16/13 230 lb (104.327 kg)  09/17/13 231 lb (104.781 kg)  03/26/13 236 lb  (107.049 kg)    Usual Body Weight: 210 lbs per wife report  % Usual Body Weight: 98%  BMI:  Body mass index is 29.53 kg/(m^2).  Estimated Nutritional Needs: Kcal: 2000-2300 Protein: 95-115 grams Fluid: 2 - 2.3 L/day  Skin: Intact  Diet Order: Diet clear liquid Room service appropriate?: Yes; Fluid consistency:: Thin  EDUCATION NEEDS: -No education needs identified at this time   Intake/Output Summary (Last 24 hours) at 08/02/14 1217 Last data filed at 08/02/14 1053  Gross per 24 hour  Intake   3320 ml  Output    300 ml  Net   3020 ml    Last BM: 4/23  Labs:   Recent Labs Lab 07/28/14 2031 08/01/14 0050 08/02/14 0612  NA 137 138 135  K 4.4 3.3* 3.3*  CL 102 100 101  CO2 '26 24 22  '$ BUN '10 9 7  '$ CREATININE 1.12 1.53* 0.90  CALCIUM 9.1 9.1 8.0*  GLUCOSE 110* 148* 92    CBG (last 3)  No results for input(s): GLUCAP in the last 72 hours.  Scheduled Meds: . aspirin EC  81 mg Oral Daily  . cefTRIAXone (ROCEPHIN)  IV  2 g Intravenous Q24H  . heparin  5,000 Units Subcutaneous 3 times per day  . metoprolol tartrate  25 mg Oral BID  . Vilazodone HCl  40 mg Oral Daily    Continuous Infusions:   Past Medical History  Diagnosis Date  .  Lung nodules     s/p bx with Dr. 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 CAD (coronary artery disease)     a.  8.2012 neg MV;  b.  05/12/2011 Inf STEMI - 100% RCA - 3.5x22 Resolute DES, Residual 80% LAD dzs. c. s/p planned DES to LAD 06/01/11.  Marland Kitchen Hyperlipidemia     takes Crestor daily  . History of tobacco abuse   . Joint pain   . Chronic back pain   . Bruises easily     takes Effient daily  . Enlarged prostate   . Depression with anxiety     takes Vibryd daily and Clonazepam prn; dx with PTSD and sees Dr.McKinney  . Erectile dysfunction   . Insomnia     takes an OTC sleep aide  . Carotid stenosis     dopplers 9/13:  RICA 40-59%  . Memory deficit 09/17/2013  . Seizures     Past Surgical History   Procedure Laterality Date  . Tonsillectomy    . Adenoidectomy    . Ankle fracture surgery    . Cervical disc surgery      3 level fusion with Cadaver Simona Huh June 2009 successful.  No complications  . Fiberoptic bronchoscopy with endobronchial  09/23/2010  . L1 vertebroplasty and l1 core biopsy.  08/18/2010  . Right thigh abscess incision and drainage.  05/04/2010  . Right inguinal hernia repair with extended prolene hernia  12/24/2008  . Decompressive anterior cervical diskectomy, c5-c6 and c6-c7.  08/20/2007  . Cardiac catheterization    . Cardiac stents      2 stents   . Colonoscopy    . Cataracts removed      bilateral  . Right lung biopsy    . Left heart catheterization with coronary angiogram N/A 05/12/2011    Procedure: LEFT HEART CATHETERIZATION WITH CORONARY ANGIOGRAM;  Surgeon: Sinclair Grooms, MD;  Location: [facility] CATH LAB;  Service: Cardiovascular;  Laterality: N/A;  . Percutaneous coronary stent intervention (pci-s) Right 05/12/2011    Procedure: PERCUTANEOUS CORONARY STENT INTERVENTION (PCI-S);  Surgeon: Sinclair Grooms, MD;  Location: [facility] CATH LAB;  Service: Cardiovascular;  Laterality: Right;  . Percutaneous coronary stent intervention (pci-s) N/A 06/01/2011    Procedure: PERCUTANEOUS CORONARY STENT INTERVENTION (PCI-S);  Surgeon: Sherren Mocha, MD;  Location: [facility] CATH LAB;  Service: Cardiovascular;  Laterality: N/A;    Kallie Locks, MS, RD, LDN Pager # [phone] After hours/ weekend pager # [phone]

## 2014-08-02 NOTE — Progress Notes (Addendum)
[facility] PROGRESS NOTE  MYRTLE BARNHARD CVE:[id number] DOB: 1940/11/25 DOA: 08/01/2014 PCP: Anthoney Harada, MD  Assessment/Plan: 1-gallstone pancreatitis with concerns for potential small stone retention: patient has remained afebrile and WBC just mildly elevated at 11.1 -will follow GI and CCS recommendations -for now will continue CLD -continue supportive care with antiemetics, analgesics and IVFF's resuscitation  2-HTN: will continue PRN hydralazine and metorpololfor now  3-hx of dementia with behavioral issues: continue PRN klonopin and daily vilazodone  4-hx of CAD: with stent in 2013 -patient effient on hold (for presumably intervention)) -no CP or SOB  5-acute on chronic renal failure: stage 3 at baseline -due to pre-renal azotemia and decrease perfusion with spacing most likely -will continue IVF's  6-hypokalemia: will replte electrolytes as needed     Code Status: Full Family Communication: wife at bedside Disposition Plan: to be determine, especially if he ended requiring surgery.   Consultants:  GI  CCS  Procedures:  See below for x-ray reports   Antibiotics:  Rocephin   HPI/Subjective: Patient slightly better, still with some abd pain and with episode of vomiting ovenight  Objective: Filed Vitals:   08/02/14 2126  BP: 152/68  Pulse: 113  Temp: 99.1 F (37.3 C)  Resp: 18    Intake/Output Summary (Last 24 hours) at 08/02/14 2340 Last data filed at 08/02/14 2129  Gross per 24 hour  Intake 2090.42 ml  Output   1000 ml  Net 1090.42 ml   Filed Weights   08/01/14 0029 08/01/14 0652 08/01/14 2057  Weight: 95.255 kg (210 lb) 92.851 kg (204 lb 11.2 oz) 93.35 kg (205 lb 12.8 oz)    Exam:   General:  Feeling better, but still with some abd discomfort and with vomiting overnight. Just nauseous today  Cardiovascular: tachycardic, no rubs or gallops  Respiratory: no wheezing, no crackles  Abdomen: tender to palpation, soft,  no guarding, positive BS  Musculoskeletal: no edema, no cyanosis  Data Reviewed: Basic Metabolic Panel:  Recent Labs Lab 07/28/14 2031 08/01/14 0050 08/02/14 0612  NA 137 138 135  K 4.4 3.3* 3.3*  CL 102 100 101  CO2 '26 24 22  '$ GLUCOSE 110* 148* 92  BUN '10 9 7  '$ CREATININE 1.12 1.53* 0.90  CALCIUM 9.1 9.1 8.0*   Liver Function Tests:  Recent Labs Lab 07/28/14 2031 08/01/14 0050 08/01/14 1210 08/02/14 0612  AST 64* 490* 376* 193*  ALT 26 329* 289* 218*  ALKPHOS 83 184* 180* 139*  BILITOT 1.3* 3.4* 3.2* 1.5*  PROT 8.7* 9.2* 8.2 8.3  ALBUMIN 3.3* 3.5 3.1* 3.0*    Recent Labs Lab 07/28/14 2031 08/01/14 0050 08/01/14 1210 08/02/14 0612  LIPASE 41 >3000* 2747* 370*   CBC:  Recent Labs Lab 07/28/14 2031 08/01/14 0050 08/02/14 0612  WBC 7.0 7.0 11.1*  NEUTROABS 4.7 5.6  --   HGB 14.0 14.2 14.7  HCT 41.9 42.3 43.9  MCV 96.1 95.5 96.3  PLT 368 394 342    Recent Results (from the past 240 hour(s))  Urine culture     Status: None   Collection Time: 07/28/14  9:15 PM  Result Value Ref Range Status   Specimen Description URINE, CLEAN CATCH  Final   Special Requests NONE  Final   Colony Count   Final    30,000 COLONIES/ML Performed at [facility]    Culture   Final    STAPHYLOCOCCUS SPECIES (COAGULASE NEGATIVE) Note: RIFAMPIN AND GENTAMICIN SHOULD NOT BE USED AS SINGLE DRUGS FOR TREATMENT  OF STAPH INFECTIONS. Performed at [facility]    Report Status 07/31/2014 FINAL  Final   Organism ID, Bacteria STAPHYLOCOCCUS SPECIES (COAGULASE NEGATIVE)  Final      Susceptibility   Staphylococcus species (coagulase negative) - MIC*    GENTAMICIN >=16 RESISTANT Resistant     LEVOFLOXACIN >=8 RESISTANT Resistant     NITROFURANTOIN <=16 SENSITIVE Sensitive     OXACILLIN >=4 RESISTANT Resistant     PENICILLIN >=0.5 RESISTANT Resistant     RIFAMPIN <=0.5 SENSITIVE Sensitive     TRIMETH/SULFA 80 RESISTANT Resistant     VANCOMYCIN 1 SENSITIVE  Sensitive     TETRACYCLINE 2 SENSITIVE Sensitive     * STAPHYLOCOCCUS SPECIES (COAGULASE NEGATIVE)     Studies: Mr Abdomen Mrcp Wo Cm  08/01/2014   CLINICAL DATA:  Abdominal pain, nausea and vomiting. Gallstones and borderline gallbladder wall thickening on prior exam.  EXAM: MRI ABDOMEN WITHOUT CONTRAST  (INCLUDING MRCP)  TECHNIQUE: Multiplanar multisequence MR imaging of the abdomen was performed. Heavily T2-weighted images of the biliary and pancreatic ducts were obtained, and three-dimensional MRCP images were rendered by post processing.  COMPARISON:  Abdominal ultrasound 08/01/2014, CT abdomen/pelvis 07/15/2014  FINDINGS: Lower chest:  Lung bases are grossly clear.  Hepatobiliary: Liver is unremarkable. Dependent gallstones are identified with mild gallbladder wall thickening predominantly at the fundus measuring 7 mm and trace pericholecystic fluid. There is an apparent 2 mm filling defect within the mid common duct image 29 series 9, although this is not reproduced on subsequent thick slab MRCP images for example image 37 series 5, and most likely represents flow artifact. MRCP is suboptimal for characterization of possible stones of this small size, however. Common duct dilatation is identified, with tapering to the level of the ampulla.  Pancreas: The pancreas is ill-defined but no focal mass or pancreatic ductal dilatation is identified allowing for lack of contrast. There is peripancreatic edema/fluid identified, tracking posteriorly, image 28 series 9.  Spleen: Normal  Adrenals/Urinary Tract: The adrenal glands are normal. Kidneys are grossly unremarkable but not fully evaluated on all sequences.  Stomach/Bowel: Bowel is unremarkable.  Vascular/Lymphatic: No lymphadenopathy.  Other: Small amount of ascites.  Musculoskeletal: Normal  IMPRESSION: Gallstones with mild gallbladder wall thickening and trace pericholecystic fluid, highly suspicious for acute cholecystitis. Probable artifactual 2 mm  filling defect within the mid common duct, not reproduced on dedicated MRCP images as described above, although the sensitivity and specificity of MRCP is limited for stones of this inherently small potential size. No common duct dilatation is identified.  Edema around the pancreatic tail with indistinctness of the pancreatic body/tail margin. Correlate with amylase and lipase for signs of early pancreatitis.   Electronically Signed   By: Conchita Paris M.D.   On: 08/01/2014 11:15   US Abdomen Limited  08/01/2014   CLINICAL DATA:  Subacute onset of abdominal pain, nausea and vomiting. Initial encounter.  EXAM: US ABDOMEN LIMITED - RIGHT UPPER QUADRANT  COMPARISON:  CT of the abdomen and pelvis from 07/15/2014, and right upper quadrant ultrasound performed 07/19/2014  FINDINGS: Gallbladder:  Stones and sludge are noted partially filling the gallbladder. The gallbladder wall appears somewhat thickened, but no pericholecystic fluid is seen. No ultrasonographic Murphy's sign is elicited.  Common bile duct:  Diameter: 0.7 cm, within normal limits for the patient's age.  Liver:  No focal lesion identified. Diffusely increased parenchymal echogenicity and coarsened echotexture, likely reflecting fatty infiltration. There is question of trace fluid adjacent to the liver.  IMPRESSION: 1. Cholelithiasis and sludge noted within the gallbladder. Gallbladder wall appears somewhat thickened, without definite evidence for obstruction or cholecystitis. Common bile duct remains normal in caliber. 2. Diffuse fatty infiltration within the liver. 3. Suggestion of trace ascites adjacent to the liver.   Electronically Signed   By: Garald Balding M.D.   On: 08/01/2014 05:07    Scheduled Meds: . aspirin EC  81 mg Oral Daily  . cefTRIAXone (ROCEPHIN)  IV  2 g Intravenous Q24H  . feeding supplement (RESOURCE BREEZE)  1 Container Oral TID BM  . heparin  5,000 Units Subcutaneous 3 times per day  . metoprolol tartrate  25 mg Oral  BID  . Vilazodone HCl  40 mg Oral Daily   Continuous Infusions:   Principal Problem:   Gallstone pancreatitis Active Problems:   Choledocholithiasis with acute cholecystitis   HTN (hypertension)   Cholecystitis    Time spent: 30 minutes    Barton Dubois  [facility] Pager [phone]. If 7PM-7AM, please contact night-coverage at [email or web address], password [facility] 08/02/2014, 11:40 PM  LOS: 1 day

## 2014-08-03 LAB — COMPREHENSIVE METABOLIC PANEL
ALBUMIN: 2.7 g/dL — AB (ref 3.5–5.2)
ALK PHOS: 122 U/L — AB (ref 39–117)
ALT: 118 U/L — ABNORMAL HIGH (ref 0–53)
ANION GAP: 10 (ref 5–15)
AST: 72 U/L — ABNORMAL HIGH (ref 0–37)
BUN: 9 mg/dL (ref 6–23)
CHLORIDE: 103 mmol/L (ref 96–112)
CO2: 25 mmol/L (ref 19–32)
Calcium: 8.2 mg/dL — ABNORMAL LOW (ref 8.4–10.5)
Creatinine, Ser: 0.88 mg/dL (ref 0.50–1.35)
GFR, EST NON AFRICAN AMERICAN: 84 mL/min — AB (ref >90)
Glucose, Bld: 101 mg/dL — ABNORMAL HIGH (ref 70–99)
Potassium: 3.2 mmol/L — ABNORMAL LOW (ref 3.5–5.1)
Sodium: 138 mmol/L (ref 135–145)
Total Bilirubin: 1.4 mg/dL — ABNORMAL HIGH (ref 0.3–1.2)
Total Protein: 7.9 g/dL (ref 6.0–8.3)

## 2014-08-03 LAB — CBC
HEMATOCRIT: 41.6 % (ref 39.0–52.0)
Hemoglobin: 13.8 g/dL (ref 13.0–17.0)
MCH: 31.7 pg (ref 26.0–34.0)
MCHC: 33.2 g/dL (ref 30.0–36.0)
MCV: 95.6 fL (ref 78.0–100.0)
Platelets: 279 10*3/uL (ref 150–400)
RBC: 4.35 MIL/uL (ref 4.22–5.81)
RDW: 14 % (ref 11.5–15.5)
WBC: 8.1 10*3/uL (ref 4.0–10.5)

## 2014-08-03 LAB — GLUCOSE, CAPILLARY: Glucose-Capillary: 92 mg/dL (ref 70–99)

## 2014-08-03 LAB — LIPASE, BLOOD: LIPASE: 41 U/L (ref 11–59)

## 2014-08-03 MED ORDER — POTASSIUM CHLORIDE CRYS ER 20 MEQ PO TBCR
20.0000 meq | EXTENDED_RELEASE_TABLET | Freq: Once | ORAL | Status: AC
  Administered 2014-08-03: 20 meq via ORAL
  Filled 2014-08-03: qty 1

## 2014-08-03 MED ORDER — SODIUM CHLORIDE 0.9 % IV SOLN
INTRAVENOUS | Status: DC
  Administered 2014-08-03 – 2014-08-05 (×4): via INTRAVENOUS

## 2014-08-03 MED ORDER — HEPARIN SODIUM (PORCINE) 5000 UNIT/ML IJ SOLN
5000.0000 [IU] | Freq: Three times a day (TID) | INTRAMUSCULAR | Status: AC
  Administered 2014-08-03 (×2): 5000 [IU] via SUBCUTANEOUS

## 2014-08-03 MED ORDER — HEPARIN SODIUM (PORCINE) 5000 UNIT/ML IJ SOLN
5000.0000 [IU] | Freq: Three times a day (TID) | INTRAMUSCULAR | Status: DC
  Administered 2014-08-05 – 2014-08-08 (×9): 5000 [IU] via SUBCUTANEOUS
  Filled 2014-08-03 (×14): qty 1

## 2014-08-03 NOTE — Progress Notes (Signed)
[facility] PROGRESS NOTE  Tyler Mayo JIR:[id number] DOB: 1940/11/03 DOA: 08/01/2014 PCP: Anthoney Harada, MD  Assessment/Plan: 1-gallstone pancreatitis with concerns for potential small stone retention: patient has remained afebrile and WBC back to WNL -will follow GI and CCS recommendations -on empiric rocephin and with plans for cholecystectomy in am -for now will continue CLD until midnight and then NPO for surgery  -continue supportive care with antiemetics and as needed analgesics   2-HTN: will continue PRN hydralazine and metoprolol now  3-hx of dementia with behavioral issues: continue PRN klonopin and daily vilazodone  4-hx of CAD: with stent in 2013 -patient effient on hold (for presumably intervention in am) -no CP or SOB -EKG w/o acute ischemic changes on admission -no abnormalities on telemetry -continue B-blocker  5-acute on chronic renal failure: stage 3 at baseline -due to pre-renal azotemia and decrease perfusion with spacing most likely -will continue IVF's, patient not eating much yet -Cr is back to normal (0.88 on 4/26)  6-hypokalemia: will replte electrolytes as needed     Code Status: Full Family Communication: wife at bedside Disposition Plan: to be determine, especially if he ended requiring surgery.   Consultants:  GI  CCS  Procedures:  See below for x-ray reports   Laparoscopic cholecystectomy 4/27  Antibiotics:  Rocephin   HPI/Subjective: Patient slightly better, still with some abd pain and with decrease PO intake/nausea. LFT's and lipase rapidly normalizing. Plan is for Laparoscopic cholecystectomy in am.  Objective: Filed Vitals:   08/03/14 2102  BP: 154/97  Pulse: 111  Temp: 98.9 F (37.2 C)  Resp: 17    Intake/Output Summary (Last 24 hours) at 08/03/14 2311 Last data filed at 08/03/14 1750  Gross per 24 hour  Intake   1207 ml  Output    626 ml  Net    581 ml   Filed Weights   08/01/14 2057  08/03/14 0434 08/03/14 2102  Weight: 93.35 kg (205 lb 12.8 oz) 94.5 kg (208 lb 5.4 oz) 95.8 kg (211 lb 3.2 oz)    Exam:   General:  Feeling better, but still with some abd discomfort in his RUQ; also with and with some nausea and decrease appetite, no vomiting. And no fever.  Cardiovascular: tachycardic, no rubs or gallops  Respiratory: no wheezing, no crackles  Abdomen: tender to palpation on RUQ, soft, no guarding, positive BS  Musculoskeletal: no edema, no cyanosis  Data Reviewed: Basic Metabolic Panel:  Recent Labs Lab 07/28/14 2031 08/01/14 0050 08/02/14 0612 08/03/14 0730  NA 137 138 135 138  K 4.4 3.3* 3.3* 3.2*  CL 102 100 101 103  CO2 '26 24 22 25  '$ GLUCOSE 110* 148* 92 101*  BUN '10 9 7 9  '$ CREATININE 1.12 1.53* 0.90 0.88  CALCIUM 9.1 9.1 8.0* 8.2*   Liver Function Tests:  Recent Labs Lab 07/28/14 2031 08/01/14 0050 08/01/14 1210 08/02/14 0612 08/03/14 0730  AST 64* 490* 376* 193* 72*  ALT 26 329* 289* 218* 118*  ALKPHOS 83 184* 180* 139* 122*  BILITOT 1.3* 3.4* 3.2* 1.5* 1.4*  PROT 8.7* 9.2* 8.2 8.3 7.9  ALBUMIN 3.3* 3.5 3.1* 3.0* 2.7*    Recent Labs Lab 07/28/14 2031 08/01/14 0050 08/01/14 1210 08/02/14 0612 08/03/14 0730  LIPASE 41 >3000* 2747* 370* 41   CBC:  Recent Labs Lab 07/28/14 2031 08/01/14 0050 08/02/14 0612 08/03/14 0730  WBC 7.0 7.0 11.1* 8.1  NEUTROABS 4.7 5.6  --   --   HGB 14.0 14.2 14.7 13.8  HCT 41.9 42.3 43.9 41.6  MCV 96.1 95.5 96.3 95.6  PLT 368 394 342 279    Recent Results (from the past 240 hour(s))  Urine culture     Status: None   Collection Time: 07/28/14  9:15 PM  Result Value Ref Range Status   Specimen Description URINE, CLEAN CATCH  Final   Special Requests NONE  Final   Colony Count   Final    30,000 COLONIES/ML Performed at [facility]    Culture   Final    STAPHYLOCOCCUS SPECIES (COAGULASE NEGATIVE) Note: RIFAMPIN AND GENTAMICIN SHOULD NOT BE USED AS SINGLE DRUGS FOR TREATMENT OF  STAPH INFECTIONS. Performed at [facility]    Report Status 07/31/2014 FINAL  Final   Organism ID, Bacteria STAPHYLOCOCCUS SPECIES (COAGULASE NEGATIVE)  Final      Susceptibility   Staphylococcus species (coagulase negative) - MIC*    GENTAMICIN >=16 RESISTANT Resistant     LEVOFLOXACIN >=8 RESISTANT Resistant     NITROFURANTOIN <=16 SENSITIVE Sensitive     OXACILLIN >=4 RESISTANT Resistant     PENICILLIN >=0.5 RESISTANT Resistant     RIFAMPIN <=0.5 SENSITIVE Sensitive     TRIMETH/SULFA 80 RESISTANT Resistant     VANCOMYCIN 1 SENSITIVE Sensitive     TETRACYCLINE 2 SENSITIVE Sensitive     * STAPHYLOCOCCUS SPECIES (COAGULASE NEGATIVE)     Studies: No results found.  Scheduled Meds: . aspirin EC  81 mg Oral Daily  . cefTRIAXone (ROCEPHIN)  IV  2 g Intravenous Q24H  . feeding supplement (RESOURCE BREEZE)  1 Container Oral TID BM  . [START ON 08/05/2014] heparin  5,000 Units Subcutaneous 3 times per day  . metoprolol tartrate  25 mg Oral BID  . Vilazodone HCl  40 mg Oral Daily   Continuous Infusions: . sodium chloride 125 mL/hr at 08/03/14 2115    Principal Problem:   Gallstone pancreatitis Active Problems:   Choledocholithiasis with acute cholecystitis   HTN (hypertension)   Cholecystitis   Dementia with behavioral disturbance   Hypokalemia   AKI (acute kidney injury)    Time spent: 30 minutes    Barton Dubois  [facility] Pager [phone]. If 7PM-7AM, please contact night-coverage at [email or web address], password [facility] 08/03/2014, 11:11 PM  LOS: 2 days

## 2014-08-03 NOTE — Progress Notes (Signed)
Patient ID: Tyler Mayo, male   DOB: July 30, 1940, 74 y.o.   MRN: [id number]    Subjective: Pt seems more comfortable today.  No further nausea.  Pain is improved  Objective: Vital signs in last 24 hours: Temp:  [99.1 F (37.3 C)-99.9 F (37.7 C)] 99.5 F (37.5 C) (04/26 0937) Pulse Rate:  [100-113] 106 (04/26 0937) Resp:  [17-18] 18 (04/26 0937) BP: (139-154)/(68-88) 144/88 mmHg (04/26 0937) SpO2:  [94 %-96 %] 95 % (04/26 0937) Weight:  [94.5 kg (208 lb 5.4 oz)] 94.5 kg (208 lb 5.4 oz) (04/26 0434) Last BM Date: 07/31/14  Intake/Output from previous day: 04/25 0701 - 04/26 0700 In: 1362.4 [P.O.:702; I.V.:610.4; IV Piggyback:50] Out: 750 [Urine:750] Intake/Output this shift: Total I/O In: 200 [P.O.:200] Out: 0   PE: Abd: soft, less tender in LQU, mildly tender in RUQ with deep palpation, +BS, ND Heart: regular Lungs: CTAB  Lab Results:   Recent Labs  08/02/14 0612 08/03/14 0730  WBC 11.1* 8.1  HGB 14.7 13.8  HCT 43.9 41.6  PLT 342 279   BMET  Recent Labs  08/02/14 0612 08/03/14 0730  NA 135 138  K 3.3* 3.2*  CL 101 103  CO2 22 25  GLUCOSE 92 101*  BUN 7 9  CREATININE 0.90 0.88  CALCIUM 8.0* 8.2*   PT/INR No results for input(s): LABPROT, INR in the last 72 hours. CMP     Component Value Date/Time   NA 138 08/03/2014 0730   NA 137 02/19/2014 1116   K 3.2* 08/03/2014 0730   K 4.2 02/19/2014 1116   CL 103 08/03/2014 0730   CL 106 08/28/2012 1138   CO2 25 08/03/2014 0730   CO2 27 02/19/2014 1116   GLUCOSE 101* 08/03/2014 0730   GLUCOSE 104 02/19/2014 1116   GLUCOSE 130* 08/28/2012 1138   BUN 9 08/03/2014 0730   BUN 12.7 02/19/2014 1116   CREATININE 0.88 08/03/2014 0730   CREATININE 1.1 02/19/2014 1116   CREATININE 1.30 07/29/2012 0219   CALCIUM 8.2* 08/03/2014 0730   CALCIUM 9.7 02/19/2014 1116   PROT 7.9 08/03/2014 0730   PROT 9.1* 02/19/2014 1116   ALBUMIN 2.7* 08/03/2014 0730   ALBUMIN 3.8 02/19/2014 1116   AST 72* 08/03/2014 0730    AST 35* 02/19/2014 1116   ALT 118* 08/03/2014 0730   ALT 25 02/19/2014 1116   ALKPHOS 122* 08/03/2014 0730   ALKPHOS 76 02/19/2014 1116   BILITOT 1.4* 08/03/2014 0730   BILITOT 0.98 02/19/2014 1116   GFRNONAA 84* 08/03/2014 0730   GFRAA >90 08/03/2014 0730   Lipase     Component Value Date/Time   LIPASE 41 08/03/2014 0730       Studies/Results: No results found.  Anti-infectives: Anti-infectives    Start     Dose/Rate Route Frequency Ordered Stop   08/01/14 1200  Ampicillin-Sulbactam (UNASYN) 3 g in sodium chloride 0.9 % 100 mL IVPB  Status:  Discontinued     3 g 100 mL/hr over 60 Minutes Intravenous Every 6 hours 08/01/14 0511 08/01/14 1051   08/01/14 1200  cefTRIAXone (ROCEPHIN) 2 g in dextrose 5 % 50 mL IVPB - Premix     2 g 100 mL/hr over 30 Minutes Intravenous Every 24 hours 08/01/14 1051     08/01/14 0530  Ampicillin-Sulbactam (UNASYN) 3 g in sodium chloride 0.9 % 100 mL IVPB     3 g 100 mL/hr over 60 Minutes Intravenous  Once 08/01/14 0502 08/01/14 6384  Assessment/Plan  1. Gallstone pancreatitis, ? Acute cholecystitis -on rocephin -MRCP suggests no CBD stone and LFTs are trending down to support this as well. -patient seems better today.  Lipase normal and pain improved.  Will plan on OR tomorrow 2. CAD, h/o DES on effient -effient has been held for 3 days.  Tomorrow will be day 4.  Last echo was normal. -will d/w anesthesia to see if they feel patient needs cards clearance.  He has been asymptomatic.   LOS: 2 days    Renee Erb E 08/03/2014, 11:31 AM Pager: [phone]

## 2014-08-03 NOTE — Progress Notes (Signed)
Daily Rounding Note  08/03/2014, 10:17 AM  LOS: 2 days   SUBJECTIVE:       Tolerating clears but not taking in much po. Says he feels "great".  No nausea today.  Abdominal pain improved but still using prn Morphine.  750 ml recorded urine though condom cath fell off and he as at least one incontinent episode.    OBJECTIVE:         Vital signs in last 24 hours:    Temp:  [99.1 F (37.3 C)-99.9 F (37.7 C)] 99.5 F (37.5 C) (04/26 0937) Pulse Rate:  [100-113] 106 (04/26 0937) Resp:  [17-18] 18 (04/26 0937) BP: (139-154)/(68-88) 144/88 mmHg (04/26 0937) SpO2:  [94 %-96 %] 95 % (04/26 0937) Weight:  [208 lb 5.4 oz (94.5 kg)] 208 lb 5.4 oz (94.5 kg) (04/26 0434) Last BM Date: 07/31/14 Filed Weights   08/01/14 0652 08/01/14 2057 08/03/14 0434  Weight: 204 lb 11.2 oz (92.851 kg) 205 lb 12.8 oz (93.35 kg) 208 lb 5.4 oz (94.5 kg)   General: somnolent, seems comfortable.  NAD   Heart: RRR Chest: clear bil.  No dyspnea or cough.  Abdomen: soft, slight RUQ tenderness, BS present  Extremities: no CCE Neuro/Psych:  Disengaged. Affect flat.  Curt answers, not opening eyes to participate in dialogue.  Moves all 4s.    Intake/Output from previous day: 04/25 0701 - 04/26 0700 In: 1362.4 [P.O.:702; I.V.:610.4; IV Piggyback:50] Out: 750 [Urine:750]  Intake/Output this shift: Total I/O In: 200 [P.O.:200] Out: 0   Lab Results:  Recent Labs  08/01/14 0050 08/02/14 0612 08/03/14 0730  WBC 7.0 11.1* 8.1  HGB 14.2 14.7 13.8  HCT 42.3 43.9 41.6  PLT 394 342 279   BMET  Recent Labs  08/01/14 0050 08/02/14 0612 08/03/14 0730  NA 138 135 138  K 3.3* 3.3* 3.2*  CL 100 101 103  CO2 '24 22 25  '$ GLUCOSE 148* 92 101*  BUN '9 7 9  '$ CREATININE 1.53* 0.90 0.88  CALCIUM 9.1 8.0* 8.2*   LFT  Recent Labs  08/01/14 1210 08/02/14 0612 08/03/14 0730  PROT 8.2 8.3 7.9  ALBUMIN 3.1* 3.0* 2.7*  AST 376* 193* 72*  ALT 289* 218*  118*  ALKPHOS 180* 139* 122*  BILITOT 3.2* 1.5* 1.4*  BILIDIR 1.7*  --   --   IBILI 1.5*  --   --    Lipase    41   Studies/Results: Mr Abdomen Mrcp Wo Cm  08/01/2014   CLINICAL DATA:  Abdominal pain, nausea and vomiting. Gallstones and borderline gallbladder wall thickening on prior exam.  EXAM: MRI ABDOMEN WITHOUT CONTRAST  (INCLUDING MRCP)  TECHNIQUE: Multiplanar multisequence MR imaging of the abdomen was performed. Heavily T2-weighted images of the biliary and pancreatic ducts were obtained, and three-dimensional MRCP images were rendered by post processing.  COMPARISON:  Abdominal ultrasound 08/01/2014, CT abdomen/pelvis 07/15/2014  FINDINGS: Lower chest:  Lung bases are grossly clear.  Hepatobiliary: Liver is unremarkable. Dependent gallstones are identified with mild gallbladder wall thickening predominantly at the fundus measuring 7 mm and trace pericholecystic fluid. There is an apparent 2 mm filling defect within the mid common duct image 29 series 9, although this is not reproduced on subsequent thick slab MRCP images for example image 37 series 5, and most likely represents flow artifact. MRCP is suboptimal for characterization of possible stones of this small size, however. Common duct dilatation is identified, with tapering to the level  of the ampulla.  Pancreas: The pancreas is ill-defined but no focal mass or pancreatic ductal dilatation is identified allowing for lack of contrast. There is peripancreatic edema/fluid identified, tracking posteriorly, image 28 series 9.  Spleen: Normal  Adrenals/Urinary Tract: The adrenal glands are normal. Kidneys are grossly unremarkable but not fully evaluated on all sequences.  Stomach/Bowel: Bowel is unremarkable.  Vascular/Lymphatic: No lymphadenopathy.  Other: Small amount of ascites.  Musculoskeletal: Normal  IMPRESSION: Gallstones with mild gallbladder wall thickening and trace pericholecystic fluid, highly suspicious for acute cholecystitis.  Probable artifactual 2 mm filling defect within the mid common duct, not reproduced on dedicated MRCP images as described above, although the sensitivity and specificity of MRCP is limited for stones of this inherently small potential size. No common duct dilatation is identified.  Edema around the pancreatic tail with indistinctness of the pancreatic body/tail margin. Correlate with amylase and lipase for signs of early pancreatitis.   Electronically Signed   By: Conchita Paris M.D.   On: 08/01/2014 11:15   Scheduled Meds: . aspirin EC  81 mg Oral Daily  . cefTRIAXone (ROCEPHIN)  IV  2 g Intravenous Q24H  . feeding supplement (RESOURCE BREEZE)  1 Container Oral TID BM  . heparin  5,000 Units Subcutaneous 3 times per day  . metoprolol tartrate  25 mg Oral BID  . Vilazodone HCl  40 mg Oral Daily   Continuous Infusions:  PRN Meds:.clonazePAM, hydrALAZINE, morphine injection, ondansetron (ZOFRAN) IV, prochlorperazine  ASSESMENT:   * Acute gallstone pancreatitis. MRCP with dilated CBD, 2 mm mid CBD defect on MRI but not on MRCP, but no definite stone.  No IVF running.  LFTs continue downward trend.   * ?Acute cholecystitis. On Rocephin.   * CAD, hx [id number] stent. Chronic Effient on hold.  * AKI. Improved. Oliguric as of yesterday.   * Hypokalemia. Persists.   * Fatty liver, trace ascites.     PLAN   *  Resume IVF *  Timing of lap chole with IOC per Dr Georgette Dover.     Azucena Freed  08/03/2014, 10:17 AM Pager: [phone]

## 2014-08-04 ENCOUNTER — Encounter (HOSPITAL_COMMUNITY)
Admission: EM | Disposition: A | Discharge status: Skilled Nursing Facility | Payer: Self-pay | Source: Home / Self Care | Attending: Internal Medicine

## 2014-08-04 ENCOUNTER — Inpatient Hospital Stay (HOSPITAL_COMMUNITY): Payer: Medicare Other | Admitting: Certified Registered Nurse Anesthetist

## 2014-08-04 ENCOUNTER — Inpatient Hospital Stay (HOSPITAL_COMMUNITY): Payer: Medicare Other

## 2014-08-04 HISTORY — PX: CHOLECYSTECTOMY: SHX55

## 2014-08-04 LAB — COMPREHENSIVE METABOLIC PANEL
ALK PHOS: 98 U/L (ref 39–117)
ALT: 69 U/L — ABNORMAL HIGH (ref 0–53)
AST: 43 U/L — AB (ref 0–37)
Albumin: 2.3 g/dL — ABNORMAL LOW (ref 3.5–5.2)
Anion gap: 8 (ref 5–15)
BILIRUBIN TOTAL: 1.5 mg/dL — AB (ref 0.3–1.2)
BUN: 8 mg/dL (ref 6–23)
CALCIUM: 7.8 mg/dL — AB (ref 8.4–10.5)
CHLORIDE: 105 mmol/L (ref 96–112)
CO2: 24 mmol/L (ref 19–32)
Creatinine, Ser: 0.72 mg/dL (ref 0.50–1.35)
GFR calc non Af Amer: >90 mL/min (ref >90)
GLUCOSE: 98 mg/dL (ref 70–99)
Potassium: 3.1 mmol/L — ABNORMAL LOW (ref 3.5–5.1)
Sodium: 137 mmol/L (ref 135–145)
Total Protein: 6.7 g/dL (ref 6.0–8.3)

## 2014-08-04 LAB — SURGICAL PCR SCREEN
MRSA, PCR: NEGATIVE
Staphylococcus aureus: NEGATIVE

## 2014-08-04 SURGERY — Surgical Case
Anesthesia: *Unknown

## 2014-08-04 SURGERY — LAPAROSCOPIC CHOLECYSTECTOMY WITH INTRAOPERATIVE CHOLANGIOGRAM
Anesthesia: General | Site: Abdomen

## 2014-08-04 MED ORDER — GLYCOPYRROLATE 0.2 MG/ML IJ SOLN
INTRAMUSCULAR | Status: AC
  Filled 2014-08-04: qty 1

## 2014-08-04 MED ORDER — METOPROLOL TARTRATE 1 MG/ML IV SOLN
INTRAVENOUS | Status: DC | PRN
  Administered 2014-08-04 (×2): 1 mg via INTRAVENOUS

## 2014-08-04 MED ORDER — SODIUM CHLORIDE 0.9 % IR SOLN
Status: DC | PRN
  Administered 2014-08-04: 1000 mL

## 2014-08-04 MED ORDER — MEPERIDINE HCL 25 MG/ML IJ SOLN: 6.2500 mg | INTRAMUSCULAR | Status: DC | PRN

## 2014-08-04 MED ORDER — LACTATED RINGERS IV SOLN
INTRAVENOUS | Status: DC
  Administered 2014-08-04 (×2): via INTRAVENOUS

## 2014-08-04 MED ORDER — BUPIVACAINE-EPINEPHRINE 0.25% -1:200000 IJ SOLN
INTRAMUSCULAR | Status: DC | PRN
  Administered 2014-08-04: 15 mL

## 2014-08-04 MED ORDER — VECURONIUM BROMIDE 10 MG IV SOLR
INTRAVENOUS | Status: AC
  Filled 2014-08-04: qty 10

## 2014-08-04 MED ORDER — ROCURONIUM BROMIDE 50 MG/5ML IV SOLN
INTRAVENOUS | Status: AC
  Filled 2014-08-04: qty 1

## 2014-08-04 MED ORDER — HYDROMORPHONE HCL 1 MG/ML IJ SOLN: 0.2500 mg | INTRAMUSCULAR | Status: DC | PRN

## 2014-08-04 MED ORDER — PROMETHAZINE HCL 25 MG/ML IJ SOLN: 6.2500 mg | INTRAMUSCULAR | Status: DC | PRN

## 2014-08-04 MED ORDER — HEMOSTATIC AGENTS (NO CHARGE) OPTIME
TOPICAL | Status: DC | PRN
  Administered 2014-08-04: 1 via TOPICAL

## 2014-08-04 MED ORDER — PHENYLEPHRINE HCL 10 MG/ML IJ SOLN
INTRAMUSCULAR | Status: DC | PRN
  Administered 2014-08-04: 120 ug via INTRAVENOUS

## 2014-08-04 MED ORDER — ONDANSETRON HCL 4 MG/2ML IJ SOLN
INTRAMUSCULAR | Status: AC
  Filled 2014-08-04: qty 2

## 2014-08-04 MED ORDER — ROCURONIUM BROMIDE 100 MG/10ML IV SOLN
INTRAVENOUS | Status: DC | PRN
  Administered 2014-08-04: 10 mg via INTRAVENOUS
  Administered 2014-08-04: 40 mg via INTRAVENOUS

## 2014-08-04 MED ORDER — PROPOFOL 10 MG/ML IV BOLUS
INTRAVENOUS | Status: DC | PRN
  Administered 2014-08-04: 90 mg via INTRAVENOUS

## 2014-08-04 MED ORDER — BUPIVACAINE-EPINEPHRINE (PF) 0.25% -1:200000 IJ SOLN
INTRAMUSCULAR | Status: AC
  Filled 2014-08-04: qty 30

## 2014-08-04 MED ORDER — ONDANSETRON HCL 4 MG/2ML IJ SOLN
INTRAMUSCULAR | Status: DC | PRN
  Administered 2014-08-04: 4 mg via INTRAVENOUS

## 2014-08-04 MED ORDER — EPHEDRINE SULFATE 50 MG/ML IJ SOLN
INTRAMUSCULAR | Status: DC | PRN
  Administered 2014-08-04: 15 mg via INTRAVENOUS

## 2014-08-04 MED ORDER — NEOSTIGMINE METHYLSULFATE 10 MG/10ML IV SOLN
INTRAVENOUS | Status: AC
  Filled 2014-08-04: qty 4

## 2014-08-04 MED ORDER — LACTATED RINGERS IV SOLN
INTRAVENOUS | Status: DC | PRN
  Administered 2014-08-04 (×2): via INTRAVENOUS

## 2014-08-04 MED ORDER — NEOSTIGMINE METHYLSULFATE 10 MG/10ML IV SOLN
INTRAVENOUS | Status: AC
Start: 2014-08-04 — End: 2014-08-04
  Filled 2014-08-04: qty 1

## 2014-08-04 MED ORDER — NEOSTIGMINE METHYLSULFATE 10 MG/10ML IV SOLN
INTRAVENOUS | Status: DC | PRN
  Administered 2014-08-04: 5 mg via INTRAVENOUS

## 2014-08-04 MED ORDER — LIDOCAINE HCL (CARDIAC) 20 MG/ML IV SOLN
INTRAVENOUS | Status: DC | PRN
  Administered 2014-08-04: 20 mg via INTRAVENOUS

## 2014-08-04 MED ORDER — GLYCOPYRROLATE 0.2 MG/ML IJ SOLN
INTRAMUSCULAR | Status: DC | PRN
  Administered 2014-08-04: .8 mg via INTRAVENOUS

## 2014-08-04 MED ORDER — FENTANYL CITRATE (PF) 250 MCG/5ML IJ SOLN
INTRAMUSCULAR | Status: AC
  Filled 2014-08-04: qty 5

## 2014-08-04 MED ORDER — PROPOFOL 10 MG/ML IV BOLUS
INTRAVENOUS | Status: AC
  Filled 2014-08-04: qty 20

## 2014-08-04 MED ORDER — 0.9 % SODIUM CHLORIDE (POUR BTL) OPTIME
TOPICAL | Status: DC | PRN
  Administered 2014-08-04: 1000 mL

## 2014-08-04 MED ORDER — GLYCOPYRROLATE 0.2 MG/ML IJ SOLN
INTRAMUSCULAR | Status: AC
  Filled 2014-08-04: qty 4

## 2014-08-04 MED ORDER — LIDOCAINE HCL (CARDIAC) 20 MG/ML IV SOLN
INTRAVENOUS | Status: AC
  Filled 2014-08-04: qty 5

## 2014-08-04 MED ORDER — SODIUM CHLORIDE 0.9 % IV SOLN
INTRAVENOUS | Status: DC | PRN
  Administered 2014-08-04: 5 mL

## 2014-08-04 MED ORDER — FENTANYL CITRATE (PF) 100 MCG/2ML IJ SOLN
INTRAMUSCULAR | Status: DC | PRN
  Administered 2014-08-04: 100 ug via INTRAVENOUS
  Administered 2014-08-04 (×3): 50 ug via INTRAVENOUS

## 2014-08-04 MED ORDER — MIDAZOLAM HCL 2 MG/2ML IJ SOLN: 0.5000 mg | Freq: Once | INTRAMUSCULAR | Status: AC | PRN

## 2014-08-04 MED ORDER — LABETALOL HCL 5 MG/ML IV SOLN
INTRAVENOUS | Status: DC | PRN
  Administered 2014-08-04: 5 mg via INTRAVENOUS

## 2014-08-04 MED ORDER — ARTIFICIAL TEARS OP OINT
TOPICAL_OINTMENT | OPHTHALMIC | Status: AC
  Filled 2014-08-04: qty 3.5

## 2014-08-04 MED ORDER — BUPIVACAINE HCL (PF) 0.25 % IJ SOLN
INTRAMUSCULAR | Status: AC
  Filled 2014-08-04: qty 30

## 2014-08-04 SURGICAL SUPPLY — 52 items
APL SKNCLS STERI-STRIP NONHPOA (GAUZE/BANDAGES/DRESSINGS) ×1
APPLIER CLIP ROT 10 11.4 M/L (STAPLE) ×3
APR CLP MED LRG 11.4X10 (STAPLE) ×1
BAG SPEC RTRVL LRG 6X4 10 (ENDOMECHANICALS) ×1
BENZOIN TINCTURE PRP APPL 2/3 (GAUZE/BANDAGES/DRESSINGS) ×3 IMPLANT
BLADE SURG ROTATE 9660 (MISCELLANEOUS) ×4 IMPLANT
CANISTER SUCTION 2500CC (MISCELLANEOUS) ×3 IMPLANT
CHLORAPREP W/TINT 26ML (MISCELLANEOUS) ×3 IMPLANT
CLIP APPLIE ROT 10 11.4 M/L (STAPLE) ×1 IMPLANT
CLOSURE STERI-STRIP 1/2X4 (GAUZE/BANDAGES/DRESSINGS) ×1
CLSR STERI-STRIP ANTIMIC 1/2X4 (GAUZE/BANDAGES/DRESSINGS) ×1 IMPLANT
COVER MAYO STAND STRL (DRAPES) ×3 IMPLANT
COVER SURGICAL LIGHT HANDLE (MISCELLANEOUS) ×3 IMPLANT
DRAPE C-ARM 42X72 X-RAY (DRAPES) ×3 IMPLANT
DRAPE LAPAROSCOPIC ABDOMINAL (DRAPES) ×3 IMPLANT
DRSG TEGADERM 2-3/8X2-3/4 SM (GAUZE/BANDAGES/DRESSINGS) ×5 IMPLANT
DRSG TEGADERM 4X4.75 (GAUZE/BANDAGES/DRESSINGS) ×3 IMPLANT
ELECT REM PT RETURN 9FT ADLT (ELECTROSURGICAL) ×3
ELECTRODE REM PT RTRN 9FT ADLT (ELECTROSURGICAL) ×1 IMPLANT
FILTER SMOKE EVAC LAPAROSHD (FILTER) ×3 IMPLANT
GAUZE SPONGE 2X2 8PLY STRL LF (GAUZE/BANDAGES/DRESSINGS) ×1 IMPLANT
GLOVE BIO SURGEON STRL SZ7 (GLOVE) ×3 IMPLANT
GLOVE BIO SURGEON STRL SZ7.5 (GLOVE) ×2 IMPLANT
GLOVE BIOGEL PI IND STRL 6 (GLOVE) IMPLANT
GLOVE BIOGEL PI IND STRL 7.0 (GLOVE) IMPLANT
GLOVE BIOGEL PI IND STRL 7.5 (GLOVE) ×1 IMPLANT
GLOVE BIOGEL PI INDICATOR 6 (GLOVE) ×2
GLOVE BIOGEL PI INDICATOR 7.0 (GLOVE) ×2
GLOVE BIOGEL PI INDICATOR 7.5 (GLOVE) ×4
GLOVE SURG SS PI 7.0 STRL IVOR (GLOVE) ×2 IMPLANT
GOWN STRL REUS W/ TWL LRG LVL3 (GOWN DISPOSABLE) ×3 IMPLANT
GOWN STRL REUS W/TWL LRG LVL3 (GOWN DISPOSABLE) ×9
HEMOSTAT SNOW SURGICEL 2X4 (HEMOSTASIS) ×2 IMPLANT
KIT BASIN OR (CUSTOM PROCEDURE TRAY) ×3 IMPLANT
KIT ROOM TURNOVER OR (KITS) ×3 IMPLANT
NS IRRIG 1000ML POUR BTL (IV SOLUTION) ×3 IMPLANT
PAD ARMBOARD 7.5X6 YLW CONV (MISCELLANEOUS) ×3 IMPLANT
POUCH SPECIMEN RETRIEVAL 10MM (ENDOMECHANICALS) ×3 IMPLANT
SCISSORS LAP 5X35 DISP (ENDOMECHANICALS) ×3 IMPLANT
SET CHOLANGIOGRAPH 5 50 .035 (SET/KITS/TRAYS/PACK) ×3 IMPLANT
SET IRRIG TUBING LAPAROSCOPIC (IRRIGATION / IRRIGATOR) ×3 IMPLANT
SLEEVE ENDOPATH XCEL 5M (ENDOMECHANICALS) ×3 IMPLANT
SPECIMEN JAR SMALL (MISCELLANEOUS) ×3 IMPLANT
SPONGE GAUZE 2X2 STER 10/PKG (GAUZE/BANDAGES/DRESSINGS) ×2
SUT MNCRL AB 4-0 PS2 18 (SUTURE) ×3 IMPLANT
TOWEL OR 17X24 6PK STRL BLUE (TOWEL DISPOSABLE) ×3 IMPLANT
TOWEL OR 17X26 10 PK STRL BLUE (TOWEL DISPOSABLE) ×3 IMPLANT
TRAY LAPAROSCOPIC (CUSTOM PROCEDURE TRAY) ×3 IMPLANT
TROCAR XCEL BLUNT TIP 100MML (ENDOMECHANICALS) ×3 IMPLANT
TROCAR XCEL NON-BLD 11X100MML (ENDOMECHANICALS) ×3 IMPLANT
TROCAR XCEL NON-BLD 5MMX100MML (ENDOMECHANICALS) ×3 IMPLANT
TUBING INSUFFLATION (TUBING) ×3 IMPLANT

## 2014-08-04 NOTE — Anesthesia Preprocedure Evaluation (Addendum)
Anesthesia Evaluation  Patient identified by MRN, date of birth, ID band Patient awake and Patient confused    History of Anesthesia Complications Negative for: history of anesthetic complications  Airway Mallampati: II  TM Distance: >3 FB Neck ROM: Full    Dental  (+) Dental Advisory Given, Teeth Intact   Pulmonary former smoker,  S/p VATS for lung nodules breath sounds clear to auscultation        Cardiovascular hypertension, Pt. on medications and Pt. on home beta blockers + CAD, + Past MI and + Cardiac Stents Rhythm:Regular Rate:Normal  '15 ECHO: EF 55-60%, valves OK   Neuro/Psych Anxiety Depression dementia TIA   GI/Hepatic GERD-  ,Elevated LFTs N/v with acute chole   Endo/Other  Morbid obesity  Renal/GU negative Renal ROS     Musculoskeletal   Abdominal   Peds  Hematology  (+) Blood dyscrasia, , Low grade lymphoma   Anesthesia Other Findings   Reproductive/Obstetrics                            Anesthesia Physical Anesthesia Plan  ASA: III  Anesthesia Plan: General   Post-op Pain Management:    Induction: Intravenous  Airway Management Planned: Oral ETT  Additional Equipment:   Intra-op Plan:   Post-operative Plan: Extubation in OR  Informed Consent: I have reviewed the patients History and Physical, chart, labs and discussed the procedure including the risks, benefits and alternatives for the proposed anesthesia with the patient or authorized representative who has indicated his/her understanding and acceptance.   Dental advisory given  Plan Discussed with: CRNA and Surgeon  Anesthesia Plan Comments: (Plan routine monitors, GETA)        Anesthesia Quick Evaluation

## 2014-08-04 NOTE — Transfer of Care (Signed)
Immediate Anesthesia Transfer of Care Note  Patient: Tyler Mayo  Procedure(s) Performed: Procedure(s): LAPAROSCOPIC CHOLECYSTECTOMY WITH INTRAOPERATIVE CHOLANGIOGRAM (N/A)  Patient Location: PACU  Anesthesia Type:General  Level of Consciousness: awake, patient cooperative and responds to stimulation  Airway & Oxygen Therapy: Patient Spontanous Breathing and Patient connected to nasal cannula oxygen  Post-op Assessment: Report given to RN and Post -op Vital signs reviewed and stable  Post vital signs: Reviewed and stable  Last Vitals:  Filed Vitals:   08/04/14 0935  BP: 170/85  Pulse: 108  Temp: 37.4 C  Resp: 18    Complications: No apparent anesthesia complications

## 2014-08-04 NOTE — Anesthesia Procedure Notes (Signed)
Procedure Name: Intubation Performed by: Gean Maidens Pre-anesthesia Checklist: Patient identified, Emergency Drugs available, Suction available, Timeout performed and Patient being monitored Patient Re-evaluated:Patient Re-evaluated prior to inductionOxygen Delivery Method: Circle system utilized Preoxygenation: Pre-oxygenation with 100% oxygen Intubation Type: IV induction Ventilation: Mask ventilation without difficulty and Oral airway inserted - appropriate to patient size Laryngoscope Size: Mac and 4 Grade View: Grade II Tube type: Oral Tube size: 7.5 mm Number of attempts: 1 Placement Confirmation: ETT inserted through vocal cords under direct vision,  positive ETCO2,  CO2 detector and breath sounds checked- equal and bilateral Secured at: 23 cm Tube secured with: Tape Dental Injury: Teeth and Oropharynx as per pre-operative assessment

## 2014-08-04 NOTE — Anesthesia Postprocedure Evaluation (Signed)
  Anesthesia Post-op Note  Patient: Tyler Mayo  Procedure(s) Performed: Procedure(s): LAPAROSCOPIC CHOLECYSTECTOMY WITH INTRAOPERATIVE CHOLANGIOGRAM (N/A)  Patient Location: PACU  Anesthesia Type:General  Level of Consciousness: awake, alert , patient cooperative and responds to stimulation  Airway and Oxygen Therapy: Patient Spontanous Breathing and Patient connected to nasal cannula oxygen  Post-op Pain: mild  Post-op Assessment: Post-op Vital signs reviewed, Patient's Cardiovascular Status Stable, Respiratory Function Stable, Patent Airway, No signs of Nausea or vomiting and Pain level controlled  Post-op Vital Signs: Reviewed and stable  Last Vitals:  Filed Vitals:   08/04/14 1800  BP: 161/82  Pulse: 90  Temp: 36.8 C  Resp: 20    Complications: No apparent anesthesia complications

## 2014-08-04 NOTE — Progress Notes (Signed)
Report called to holding.  All questions answered.

## 2014-08-04 NOTE — Op Note (Signed)
Laparoscopic Cholecystectomy with IOC Procedure Note  Indications: This patient presents with symptomatic gallbladder disease with pancreatitis and will undergo laparoscopic cholecystectomy.  Pre-operative Diagnosis: Gallstone pancreatitis  Post-operative Diagnosis: Same  Surgeon: Seeley Southgate K.   Assistants: none  Anesthesia: General endotracheal anesthesia  ASA Class: 2  Procedure Details  The patient was seen again in the Holding Room. The risks, benefits, complications, treatment options, and expected outcomes were discussed with the patient. The possibilities of reaction to medication, pulmonary aspiration, perforation of viscus, bleeding, recurrent infection, finding a normal gallbladder, the need for additional procedures, failure to diagnose a condition, the possible need to convert to an open procedure, and creating a complication requiring transfusion or operation were discussed with the patient. The likelihood of improving the patient's symptoms with return to their baseline status is good.  The patient and/or family concurred with the proposed plan, giving informed consent. The site of surgery properly noted. The patient was taken to Operating Room, identified as Luis Abed and the procedure verified as Laparoscopic Cholecystectomy with Intraoperative Cholangiogram. A Time Out was held and the above information confirmed.  General endotracheal anesthesia was then administered and tolerated well. After the induction, the abdomen was prepped with Chloraprep and draped in the sterile fashion. The patient was positioned in the supine position.  Local anesthetic agent was injected into the skin near the umbilicus and an incision made. We dissected down to the abdominal fascia with blunt dissection.  The fascia was incised vertically and we entered the peritoneal cavity bluntly.  A pursestring suture of 0-Vicryl was placed around the fascial opening.  The Hasson cannula was  inserted and secured with the stay suture.  Pneumoperitoneum was then created with CO2 and tolerated well without any adverse changes in the patient's vital signs. An 11-mm port was placed in the subxiphoid position.  Two 5-mm ports were placed in the right upper quadrant. All skin incisions were infiltrated with a local anesthetic agent before making the incision and placing the trocars.   We positioned the patient in reverse Trendelenburg, tilted slightly to the patient's left.  The omentum is adherent to the gallbladder.  We gently peeled the omentum away. The gallbladder was identified, the fundus grasped and retracted cephalad.  The gallbladder was thickened and inflamed.  Adhesions were lysed bluntly and with the electrocautery where indicated, taking care not to injure any adjacent organs or viscus. The infundibulum was grasped and retracted laterally, exposing the peritoneum overlying the triangle of Calot. This was then divided and exposed in a blunt fashion. A critical view of the cystic duct and cystic artery was obtained.  The cystic duct was clearly identified and bluntly dissected circumferentially. The cystic duct was ligated with a clip distally.   An incision was made in the cystic duct and the [facility] cholangiogram catheter introduced. The catheter was secured using a clip. A cholangiogram was then obtained which showed good visualization of the distal and proximal biliary tree with no sign of filling defects or obstruction.  Contrast flowed easily into the duodenum. The catheter was then removed.   The cystic duct was then ligated with clips and divided. The cystic artery was identified, dissected free, ligated with clips and divided as well.   The gallbladder was dissected from the liver bed in retrograde fashion with the electrocautery. The gallbladder was removed and placed in an Endocatch sac. The liver bed was irrigated and inspected. Hemostasis was achieved with the electrocautery and  Surgicel SNOW. Copious irrigation  was utilized and was repeatedly aspirated until clear.  The gallbladder and Endocatch sac were then removed through the umbilical port site.  The pursestring suture was used to close the umbilical fascia.    We again inspected the right upper quadrant for hemostasis.  Pneumoperitoneum was released as we removed the trocars.  4-0 Monocryl was used to close the skin.   Benzoin, steri-strips, and clean dressings were applied. The patient was then extubated and brought to the recovery room in stable condition. Instrument, sponge, and needle counts were correct at closure and at the conclusion of the case.   Findings: Cholecystitis with Cholelithiasis  Estimated Blood Loss: less than 50 mL         Drains: none         Specimens: Gallbladder           Complications: None; patient tolerated the procedure well.         Disposition: PACU - hemodynamically stable.         Condition: stable   Tyler Mayo. Georgette Dover, MD, [facility]  General/ Trauma Surgery  08/04/2014 4:59 PM

## 2014-08-04 NOTE — Progress Notes (Signed)
[facility] PROGRESS NOTE  Tyler Mayo BTD:[id number] DOB: Sep 28, 1940 DOA: 08/01/2014 PCP: Anthoney Harada, MD  Assessment/Plan: 1-gallstone pancreatitis with concerns for potential small stone retention:  -will follow GI and CCS recommendations -on empiric rocephin and with plans for cholecystectomy 4-27 and IOC.  -continue supportive care with antiemetics and as needed analgesics   2-HTN: will continue PRN hydralazine and metoprolol.   3-hx of dementia with behavioral issues: continue PRN klonopin and daily vilazodone  4-hx of CAD: with stent in 2013 -patient effient on hold (for presumably intervention in am) -no CP or SOB -EKG w/o acute ischemic changes on admission -no abnormalities on telemetry -continue B-blocker  5-acute on chronic renal failure: stage 3 at baseline -due to pre-renal azotemia and decrease perfusion with spacing most likely -will continue IVF's, -Cr is back to normal (0.88 on 4/26)  6-hypokalemia: will replte electrolytes as needed     Code Status: Full Family Communication: wife at bedside Disposition Plan: need PT evaluation post surgery to determine disposition. Might need SNF.    Consultants:  GI  CCS  Procedures:  See below for x-ray reports   Laparoscopic cholecystectomy 4/27  Antibiotics:  Rocephin   HPI/Subjective: He is alert. Denies worsening abdominal pain.  No nausea, no vomiting.   Objective: Filed Vitals:   08/04/14 0935  BP: 170/85  Pulse: 108  Temp: 99.4 F (37.4 C)  Resp: 18    Intake/Output Summary (Last 24 hours) at 08/04/14 1129 Last data filed at 08/04/14 0600  Gross per 24 hour  Intake   2150 ml  Output   1027 ml  Net   1123 ml   Filed Weights   08/01/14 2057 08/03/14 0434 08/03/14 2102  Weight: 93.35 kg (205 lb 12.8 oz) 94.5 kg (208 lb 5.4 oz) 95.8 kg (211 lb 3.2 oz)    Exam:   General: NAD  Cardiovascular: tachycardic, no rubs or gallops  Respiratory: no wheezing, no  crackles  Abdomen: tender to palpation on RUQ, soft, no guarding, positive BS  Musculoskeletal: no edema, no cyanosis  Data Reviewed: Basic Metabolic Panel:  Recent Labs Lab 07/28/14 2031 08/01/14 0050 08/02/14 0612 08/03/14 0730  NA 137 138 135 138  K 4.4 3.3* 3.3* 3.2*  CL 102 100 101 103  CO2 '26 24 22 25  '$ GLUCOSE 110* 148* 92 101*  BUN '10 9 7 9  '$ CREATININE 1.12 1.53* 0.90 0.88  CALCIUM 9.1 9.1 8.0* 8.2*   Liver Function Tests:  Recent Labs Lab 07/28/14 2031 08/01/14 0050 08/01/14 1210 08/02/14 0612 08/03/14 0730  AST 64* 490* 376* 193* 72*  ALT 26 329* 289* 218* 118*  ALKPHOS 83 184* 180* 139* 122*  BILITOT 1.3* 3.4* 3.2* 1.5* 1.4*  PROT 8.7* 9.2* 8.2 8.3 7.9  ALBUMIN 3.3* 3.5 3.1* 3.0* 2.7*    Recent Labs Lab 07/28/14 2031 08/01/14 0050 08/01/14 1210 08/02/14 0612 08/03/14 0730  LIPASE 41 >3000* 2747* 370* 41   CBC:  Recent Labs Lab 07/28/14 2031 08/01/14 0050 08/02/14 0612 08/03/14 0730  WBC 7.0 7.0 11.1* 8.1  NEUTROABS 4.7 5.6  --   --   HGB 14.0 14.2 14.7 13.8  HCT 41.9 42.3 43.9 41.6  MCV 96.1 95.5 96.3 95.6  PLT 368 394 342 279    Recent Results (from the past 240 hour(s))  Urine culture     Status: None   Collection Time: 07/28/14  9:15 PM  Result Value Ref Range Status   Specimen Description URINE, CLEAN CATCH  Final  Special Requests NONE  Final   Colony Count   Final    30,000 COLONIES/ML Performed at [facility]    Culture   Final    STAPHYLOCOCCUS SPECIES (COAGULASE NEGATIVE) Note: RIFAMPIN AND GENTAMICIN SHOULD NOT BE USED AS SINGLE DRUGS FOR TREATMENT OF STAPH INFECTIONS. Performed at [facility]    Report Status 07/31/2014 FINAL  Final   Organism ID, Bacteria STAPHYLOCOCCUS SPECIES (COAGULASE NEGATIVE)  Final      Susceptibility   Staphylococcus species (coagulase negative) - MIC*    GENTAMICIN >=16 RESISTANT Resistant     LEVOFLOXACIN >=8 RESISTANT Resistant     NITROFURANTOIN <=16 SENSITIVE  Sensitive     OXACILLIN >=4 RESISTANT Resistant     PENICILLIN >=0.5 RESISTANT Resistant     RIFAMPIN <=0.5 SENSITIVE Sensitive     TRIMETH/SULFA 80 RESISTANT Resistant     VANCOMYCIN 1 SENSITIVE Sensitive     TETRACYCLINE 2 SENSITIVE Sensitive     * STAPHYLOCOCCUS SPECIES (COAGULASE NEGATIVE)  Surgical pcr screen     Status: None   Collection Time: 08/04/14  2:45 AM  Result Value Ref Range Status   MRSA, PCR NEGATIVE NEGATIVE Final   Staphylococcus aureus NEGATIVE NEGATIVE Final    Comment:        The Xpert SA Assay (FDA approved for NASAL specimens in patients over 64 years of age), is one component of a comprehensive surveillance program.  Test performance has been validated by [facility] for patients greater than or equal to 76 year old. It is not intended to diagnose infection nor to guide or monitor treatment.      Studies: No results found.  Scheduled Meds: . aspirin EC  81 mg Oral Daily  . cefTRIAXone (ROCEPHIN)  IV  2 g Intravenous Q24H  . feeding supplement (RESOURCE BREEZE)  1 Container Oral TID BM  . [START ON 08/05/2014] heparin  5,000 Units Subcutaneous 3 times per day  . metoprolol tartrate  25 mg Oral BID  . Vilazodone HCl  40 mg Oral Daily   Continuous Infusions: . sodium chloride 125 mL/hr at 08/04/14 0700    Principal Problem:   Gallstone pancreatitis Active Problems:   Choledocholithiasis with acute cholecystitis   HTN (hypertension)   Cholecystitis   Dementia with behavioral disturbance   Hypokalemia   AKI (acute kidney injury)    Time spent: 30 minutes    Tyler Mayo, [facility] Pager [phone]. If 7PM-7AM, please contact night-coverage at [email or web address], password [facility] 08/04/2014, 11:29 AM  LOS: 3 days

## 2014-08-05 ENCOUNTER — Encounter (HOSPITAL_COMMUNITY): Payer: Self-pay | Admitting: Surgery

## 2014-08-05 LAB — COMPREHENSIVE METABOLIC PANEL
ALBUMIN: 2.3 g/dL — AB (ref 3.5–5.2)
ALT: 64 U/L — ABNORMAL HIGH (ref 0–53)
AST: 70 U/L — ABNORMAL HIGH (ref 0–37)
Alkaline Phosphatase: 108 U/L (ref 39–117)
Anion gap: 10 (ref 5–15)
CALCIUM: 8.1 mg/dL — AB (ref 8.4–10.5)
CO2: 28 mmol/L (ref 19–32)
CREATININE: 0.77 mg/dL (ref 0.50–1.35)
Chloride: 100 mmol/L (ref 96–112)
GFR calc non Af Amer: 89 mL/min — ABNORMAL LOW (ref >90)
GLUCOSE: 93 mg/dL (ref 70–99)
POTASSIUM: 3 mmol/L — AB (ref 3.5–5.1)
Sodium: 138 mmol/L (ref 135–145)
TOTAL PROTEIN: 7.2 g/dL (ref 6.0–8.3)
Total Bilirubin: 1.3 mg/dL — ABNORMAL HIGH (ref 0.3–1.2)

## 2014-08-05 LAB — CBC
HEMATOCRIT: 42.1 % (ref 39.0–52.0)
Hemoglobin: 14 g/dL (ref 13.0–17.0)
MCH: 31.5 pg (ref 26.0–34.0)
MCHC: 33.3 g/dL (ref 30.0–36.0)
MCV: 94.6 fL (ref 78.0–100.0)
Platelets: 239 10*3/uL (ref 150–400)
RBC: 4.45 MIL/uL (ref 4.22–5.81)
RDW: 13.7 % (ref 11.5–15.5)
WBC: 10.3 10*3/uL (ref 4.0–10.5)

## 2014-08-05 LAB — GLUCOSE, CAPILLARY: Glucose-Capillary: 98 mg/dL (ref 70–99)

## 2014-08-05 MED ORDER — DEXTROSE-NACL 5-0.9 % IV SOLN
INTRAVENOUS | Status: DC
  Administered 2014-08-05 – 2014-08-07 (×4): via INTRAVENOUS

## 2014-08-05 MED ORDER — POTASSIUM CHLORIDE 10 MEQ/100ML IV SOLN
10.0000 meq | INTRAVENOUS | Status: AC
  Administered 2014-08-05 (×3): 10 meq via INTRAVENOUS
  Filled 2014-08-05 (×3): qty 100

## 2014-08-05 MED ORDER — POTASSIUM CHLORIDE 10 MEQ/100ML IV SOLN
10.0000 meq | INTRAVENOUS | Status: AC
  Administered 2014-08-05 (×2): 10 meq via INTRAVENOUS
  Filled 2014-08-05 (×2): qty 100

## 2014-08-05 MED ORDER — MORPHINE SULFATE 2 MG/ML IJ SOLN: 1.0000 mg | INTRAMUSCULAR | Status: DC | PRN

## 2014-08-05 MED ORDER — METOPROLOL TARTRATE 1 MG/ML IV SOLN
5.0000 mg | Freq: Once | INTRAVENOUS | Status: AC
  Administered 2014-08-05: 5 mg via INTRAVENOUS
  Filled 2014-08-05 (×2): qty 5

## 2014-08-05 NOTE — Progress Notes (Signed)
UR COMPLETED  

## 2014-08-05 NOTE — Evaluation (Signed)
Physical Therapy Evaluation Patient Details Name: Tyler Mayo MRN: [id number] DOB: [birth year] Today's Date: 08/05/2014   History of Present Illness  Patient is a 74 yo male admitted 08/01/14 with abdominal pain.  Patient now s/p lap cholecystecomy on 08/04/14.  PMH:  Dementia, CAD, back pain, depression, seizures, amxiety  Clinical Impression  Patient presents with problems listed below.  Will benefit from acute PT to maximize independence prior to discharge.  Wife reports she is working on getting 24 hour assist.  Recommend HHPT at discharge for continued therapy.  If slow to progress, may need to consider ST-SNF.    Follow Up Recommendations Home health PT;Supervision/Assistance - 24 hour    Equipment Recommendations  Rolling walker with 5" wheels    Recommendations for Other Services       Precautions / Restrictions Precautions Precautions: Fall Restrictions Weight Bearing Restrictions: No      Mobility  Bed Mobility Overal bed mobility: Needs Assistance Bed Mobility: Rolling;Sidelying to Sit Rolling: Min assist Sidelying to sit: Mod assist       General bed mobility comments: Patient very lethargic.  Cues to move LE's off of bed - no response.  Physical assist to reach for rail with UE to roll, and patient able to move to sidelying position with min assist.  Required mod assist to raise trunk to sitting position.  Able to sit EOB with min guard assist for safety.  Transfers Overall transfer level: Needs assistance Equipment used: 2 person hand held assist Transfers: Sit to/from Stand Sit to Stand: Min assist;+2 physical assistance         General transfer comment: Verbal and physical cues to move to standing.  Min assist of 2 to rise to standing and for balance.  Ambulation/Gait Ambulation/Gait assistance: Min assist;+2 physical assistance Ambulation Distance (Feet): 18 Feet Assistive device: 2 person hand held assist Gait Pattern/deviations: Step-through  pattern;Decreased stride length;Shuffle;Trunk flexed Gait velocity: Decreased Gait velocity interpretation: Below normal speed for age/gender General Gait Details: Verbal cues to stand upright during gait - patient with flexed posture.  Required +2 hand-hold assist for gait/safety/balance.  Shuffling steps during gait.  Stairs            Wheelchair Mobility    Modified Rankin (Stroke Patients Only)       Balance                                             Pertinent Vitals/Pain Pain Assessment: Faces Faces Pain Scale: Hurts little more Pain Location: Abdomen Pain Descriptors / Indicators: Sore Pain Intervention(s): Monitored during session;Repositioned    Home Living Family/patient expects to be discharged to:: Private residence Living Arrangements: Spouse/significant other Available Help at Discharge: Family;Available 24 hours/day (Wife working with family to provide 24 hour assist) Type of Home: House Home Access: Stairs to enter Entrance Stairs-Rails: Psychiatric nurse of Steps: 3 Home Layout: One level Home Equipment: None Additional Comments: Wife works and patient had been home alone prior to this admission.    Prior Function Level of Independence: Independent;Needs assistance   Gait / Transfers Assistance Needed: Ambulates independently.  Is in bed most of the time due to back pain per wife.  ADL's / Homemaking Assistance Needed: Wife reports she assists patient with bath due to back pain.        Hand Dominance  Extremity/Trunk Assessment   Upper Extremity Assessment: Overall WFL for tasks assessed           Lower Extremity Assessment: Generalized weakness         Communication   Communication: No difficulties  Cognition Arousal/Alertness: Lethargic;Suspect due to medications Behavior During Therapy: Agitated;Flat affect Overall Cognitive Status: History of cognitive impairments - at baseline  (Difficult to assess due to lethargy)                      General Comments      Exercises        Assessment/Plan    PT Assessment Patient needs continued PT services  PT Diagnosis Difficulty walking;Abnormality of gait;Generalized weakness;Acute pain;Altered mental status   PT Problem List Decreased strength;Decreased activity tolerance;Decreased balance;Decreased mobility;Decreased cognition;Decreased knowledge of use of DME;Pain  PT Treatment Interventions DME instruction;Gait training;Stair training;Functional mobility training;Therapeutic activities;Patient/family education;Cognitive remediation   PT Goals (Current goals can be found in the Care Plan section) Acute Rehab PT Goals Patient Stated Goal: None stated  PT Goal Formulation: With patient/family Time For Goal Achievement: 08/12/14 Potential to Achieve Goals: Good    Frequency Min 3X/week   Barriers to discharge Decreased caregiver support Wife works and patient home alone during day.  Wife reports she is working with family to try to have 24 hour assist.    Co-evaluation               End of Session Equipment Utilized During Treatment: Gait belt Activity Tolerance: Patient limited by lethargy;Patient limited by fatigue Patient left: in chair;with call bell/phone within reach;with chair alarm set;with family/visitor present Nurse Communication: Mobility status         Time: 4818-5631 PT Time Calculation (min) (ACUTE ONLY): 18 min   Charges:   PT Evaluation $Initial PT Evaluation Tier I: 1 Procedure     PT G CodesDespina Pole 2014-08-31, 4:15 PM Carita Pian. Sanjuana Kava, [place] Pager [phone]

## 2014-08-05 NOTE — Progress Notes (Signed)
[facility] PROGRESS NOTE  Tyler Mayo LDJ:[id number] DOB: [birth year] DOA: 08/01/2014 PCP: Anthoney Harada, MD  Assessment/Plan: 1-Gallstone pancreatitis ; Cholecystitis.  - GI and CCS following.  -on empiric rocephin and S/P cholecystectomy 4-27 and IOC.  -continue supportive care with antiemetics and as needed analgesics   2-Encephalopathy. Patient lethargic. Suspect related to opioid. He open eyes follow some command, goes back to sleep. Will decrease morphine to 1 mg Q 4 hours. Hold if sedation. Discussed with staff,.   3-HTN: will continue PRN hydralazine and metoprolol.   4-hx of dementia with behavioral issues: continue PRN klonopin and daily vilazodone  5-hx of CAD: with stent in 2013 -patient effient on hold (for presumably intervention in am) -no CP or SOB -EKG w/o acute ischemic changes on admission -no abnormalities on telemetry -continue B-blocker. One time dose IV , patient unable to take orals.  -resume effient when ok by surgery.   6-Acute on chronic renal failure: stage 3 at baseline -due to pre-renal azotemia and decrease perfusion with spacing most likely -will continue IVF's, -Cr is back to normal (0.88 on 4/26)  7-hypokalemia: will replte IV.       Code Status: Full Family Communication: wife at bedside Disposition Plan: need PT evaluation post surgery to determine disposition. Might need SNF.    Consultants:  GI  CCS  Procedures:  See below for x-ray reports   Laparoscopic cholecystectomy 4/27  Antibiotics:  Rocephin   HPI/Subjective: He is lethargic, wake up for minute follow some command.   Objective: Filed Vitals:   08/05/14 0835  BP: 154/82  Pulse: 119  Temp: 98.3 F (36.8 C)  Resp: 17    Intake/Output Summary (Last 24 hours) at 08/05/14 1437 Last data filed at 08/05/14 1320  Gross per 24 hour  Intake 2155.83 ml  Output   1401 ml  Net 754.83 ml   Filed Weights   08/03/14 0434 08/03/14 2102 08/04/14  2143  Weight: 94.5 kg (208 lb 5.4 oz) 95.8 kg (211 lb 3.2 oz) 98.5 kg (217 lb 2.5 oz)    Exam:   General: NAD  Cardiovascular: tachycardic, no rubs or gallops  Respiratory: no wheezing, no crackles  Abdomen: tender to palpation on RUQ, soft, no guarding, positive BS  Musculoskeletal: no edema, no cyanosis  Data Reviewed: Basic Metabolic Panel:  Recent Labs Lab 08/01/14 0050 08/02/14 0612 08/03/14 0730 08/04/14 1151 08/05/14 0535  NA 138 135 138 137 138  K 3.3* 3.3* 3.2* 3.1* 3.0*  CL 100 101 103 105 100  CO2 '24 22 25 24 28  '$ GLUCOSE 148* 92 101* 98 93  BUN '9 7 9 8 '$ <5*  CREATININE 1.53* 0.90 0.88 0.72 0.77  CALCIUM 9.1 8.0* 8.2* 7.8* 8.1*   Liver Function Tests:  Recent Labs Lab 08/01/14 1210 08/02/14 0612 08/03/14 0730 08/04/14 1151 08/05/14 0535  AST 376* 193* 72* 43* 70*  ALT 289* 218* 118* 69* 64*  ALKPHOS 180* 139* 122* 98 108  BILITOT 3.2* 1.5* 1.4* 1.5* 1.3*  PROT 8.2 8.3 7.9 6.7 7.2  ALBUMIN 3.1* 3.0* 2.7* 2.3* 2.3*    Recent Labs Lab 08/01/14 0050 08/01/14 1210 08/02/14 0612 08/03/14 0730  LIPASE >3000* 2747* 370* 41   CBC:  Recent Labs Lab 08/01/14 0050 08/02/14 0612 08/03/14 0730 08/05/14 0535  WBC 7.0 11.1* 8.1 10.3  NEUTROABS 5.6  --   --   --   HGB 14.2 14.7 13.8 14.0  HCT 42.3 43.9 41.6 42.1  MCV 95.5 96.3 95.6 94.6  PLT 394 342 279 239    Recent Results (from the past 240 hour(s))  Urine culture     Status: None   Collection Time: 07/28/14  9:15 PM  Result Value Ref Range Status   Specimen Description URINE, CLEAN CATCH  Final   Special Requests NONE  Final   Colony Count   Final    30,000 COLONIES/ML Performed at [facility]    Culture   Final    STAPHYLOCOCCUS SPECIES (COAGULASE NEGATIVE) Note: RIFAMPIN AND GENTAMICIN SHOULD NOT BE USED AS SINGLE DRUGS FOR TREATMENT OF STAPH INFECTIONS. Performed at [facility]    Report Status 07/31/2014 FINAL  Final   Organism ID, Bacteria STAPHYLOCOCCUS  SPECIES (COAGULASE NEGATIVE)  Final      Susceptibility   Staphylococcus species (coagulase negative) - MIC*    GENTAMICIN >=16 RESISTANT Resistant     LEVOFLOXACIN >=8 RESISTANT Resistant     NITROFURANTOIN <=16 SENSITIVE Sensitive     OXACILLIN >=4 RESISTANT Resistant     PENICILLIN >=0.5 RESISTANT Resistant     RIFAMPIN <=0.5 SENSITIVE Sensitive     TRIMETH/SULFA 80 RESISTANT Resistant     VANCOMYCIN 1 SENSITIVE Sensitive     TETRACYCLINE 2 SENSITIVE Sensitive     * STAPHYLOCOCCUS SPECIES (COAGULASE NEGATIVE)  Surgical pcr screen     Status: None   Collection Time: 08/04/14  2:45 AM  Result Value Ref Range Status   MRSA, PCR NEGATIVE NEGATIVE Final   Staphylococcus aureus NEGATIVE NEGATIVE Final    Comment:        The Xpert SA Assay (FDA approved for NASAL specimens in patients over 67 years of age), is one component of a comprehensive surveillance program.  Test performance has been validated by [facility] for patients greater than or equal to 101 year old. It is not intended to diagnose infection nor to guide or monitor treatment.      Studies: Dg Cholangiogram Operative  08/04/2014   CLINICAL DATA:  Laparoscopic cholecystectomy  EXAM: INTRAOPERATIVE CHOLANGIOGRAM  FLUOROSCOPY TIME:  8 seconds  COMPARISON:  MRCP - 08/01/2014; abdominal ultrasound - 08/01/2014  FINDINGS: Intraoperative cholangiographic images of the right upper abdominal quadrant during laparoscopic cholecystectomy are provided for review.  Surgical clips overlie the expected location of the gallbladder fossa.  Contrast injection demonstrates selective cannulation of the central aspect of the cystic duct.  There is passage of contrast through the central aspect of the cystic duct with filling of a non dilated common bile duct. There is passage of contrast though the CBD and into the descending portion of the duodenum.  There is minimal reflux of injected contrast into the common hepatic duct and central aspect  of the non dilated intrahepatic biliary system. There is minimal opacification of central aspect of the pancreatic duct which appears nondilated.  Several bubbles are noted immediately cranial to the insertion of the cystic duct. There are no discrete persistent filling defects within the opacified portions of the biliary system to suggest the presence of choledocholithiasis.  IMPRESSION: No evidence of choledocholithiasis.   Electronically Signed   By: Sandi Mariscal M.D.   On: 08/04/2014 16:44    Scheduled Meds: . aspirin EC  81 mg Oral Daily  . cefTRIAXone (ROCEPHIN)  IV  2 g Intravenous Q24H  . feeding supplement (RESOURCE BREEZE)  1 Container Oral TID BM  . heparin  5,000 Units Subcutaneous 3 times per day  . metoprolol tartrate  25 mg Oral BID  . potassium  chloride  10 mEq Intravenous Q1 Hr x 2  . Vilazodone HCl  40 mg Oral Daily   Continuous Infusions: . dextrose 5 % and 0.9% NaCl 75 mL/hr at 08/05/14 1231    Principal Problem:   Gallstone pancreatitis Active Problems:   Choledocholithiasis with acute cholecystitis   HTN (hypertension)   Cholecystitis   Dementia with behavioral disturbance   Hypokalemia   AKI (acute kidney injury)    Time spent: 30 minutes    Kazoua Gossen, [place] [facility] Pager [phone]. If 7PM-7AM, please contact night-coverage at [email or web address], password [facility] 08/05/2014, 2:37 PM  LOS: 4 days

## 2014-08-05 NOTE — Progress Notes (Signed)
NUTRITION FOLLOW UP  Intervention:   D/c Resource Breeze po TID, each supplement provides 250 kcal and 9 grams of protein  Once diet advances, provide Ensure Enlive po TID, each supplement provides 350 kcal and 20 grams of protein  Encourage adequate PO intake  Nutrition Dx:   Increased nutrient needs related to pancreatitis as evidenced by estimated nutrition needs. ongoing  Goal:   Pt to meet >/= 90% of their estimated nutrition needs; not met  Monitor:   Diet advancement, PO intake, weight trends, labs, I/O's  Assessment:   Pt h/o dementia, presented to the ED with c/o severe, sudden onset abdominal pain. Pt with n/v and recently diagnosed with gallstones on 4/11. Laparoscopic Cholecystectomy on 4/27  Pt asleep at time of visit, wife at bedside. Pt's clear liquid tray in room mostly untouched. Per wife, pt hates this diet and refuses to eat mostly what is provided. He also dislikes [employer], tried Psychologist, counselling. RD to d/c. Per wife pt likes Ensure Chocolate, but this is not allowed per diet order. Encouraged trying clear liquids so that nursing can assess tolerance and would advance diet, wife said she is going "to nag" now. RD to monitor. Labs reviewed: K 3.0, BUN <5, Ca 8.1  Height: Ht Readings from Last 1 Encounters:  08/03/14 _0  (1.778 m)    Weight Status:   Wt Readings from Last 1 Encounters:  08/04/14 217 lb 2.5 oz (98.5 kg)    Re-estimated needs:  Kcal: 2000-2300 Protein: 95-115 grams Fluid: 2 - 2.3 L/day  Skin: laparoscopic incisions  Diet Order: Diet clear liquid Room service appropriate?: Yes; Fluid consistency:: Thin   Intake/Output Summary (Last 24 hours) at 08/05/14 1422 Last data filed at 08/05/14 1320  Gross per 24 hour  Intake 2155.83 ml  Output   1401 ml  Net 754.83 ml    Last BM: 4/26   Labs:   Recent Labs Lab 08/03/14 0730 08/04/14 1151 08/05/14 0535  NA 138 137 138  K 3.2* 3.1* 3.0*  CL 103 105 100  CO2 _1 BUN  9 8 <5*  CREATININE 0.88 0.72 0.77  CALCIUM 8.2* 7.8* 8.1*  GLUCOSE 101* 98 93    CBG (last 3)   Recent Labs  08/03/14 2056 08/05/14 1401  GLUCAP 92 98    Scheduled Meds: . aspirin EC  81 mg Oral Daily  . cefTRIAXone (ROCEPHIN)  IV  2 g Intravenous Q24H  . feeding supplement (RESOURCE BREEZE)  1 Container Oral TID BM  . heparin  5,000 Units Subcutaneous 3 times per day  . metoprolol tartrate  25 mg Oral BID  . potassium chloride  10 mEq Intravenous Q1 Hr x 2  . Vilazodone HCl  40 mg Oral Daily    Continuous Infusions: . dextrose 5 % and 0.9% NaCl 75 mL/hr at 08/05/14 1231    Rasma A. [place] Dietetic Intern Pager: [phone] 08/05/2014 2:39 PM   _______________________  RD read, reviewed, and agree with student dietitian's note. Appropriate changes have been made.  Kallie Locks, MS, RD, LDN Pager # [phone] After hours/ weekend pager # [phone]

## 2014-08-05 NOTE — Progress Notes (Signed)
Patient ID: Tyler Mayo, male   DOB: 09/04/1940, 74 y.o.   MRN: [id number] 1 Day Post-Op  Subjective: Pt sleeping and almost rolled out of bed.  Wife and I got patient back on his back.  He was very unhappy with me for touching him.  Won't answer many of my questions.  Hasn't eaten his liquids yet  Objective: Vital signs in last 24 hours: Temp:  [97.7 F (36.5 C)-99.7 F (37.6 C)] 98.3 F (36.8 C) (04/28 0835) Pulse Rate:  [84-119] 119 (04/28 0835) Resp:  [17-22] 17 (04/28 0835) BP: (149-172)/(73-101) 154/82 mmHg (04/28 0835) SpO2:  [90 %-95 %] 95 % (04/28 0835) Weight:  [98.5 kg (217 lb 2.5 oz)] 98.5 kg (217 lb 2.5 oz) (04/27 2143) Last BM Date: 08/04/14  Intake/Output from previous day: 04/27 0701 - 04/28 0700 In: 2621.7 [P.O.:120; I.V.:2451.7; IV Piggyback:50] Out: 901 [Urine:875; Stool:1; Blood:25] Intake/Output this shift: Total I/O In: 0  Out: 500 [Urine:500]  PE: Abd: soft, appropriately tender, +BS, ND, incisions c/d/i  Lab Results:   Recent Labs  08/03/14 0730 08/05/14 0535  WBC 8.1 10.3  HGB 13.8 14.0  HCT 41.6 42.1  PLT 279 239   BMET  Recent Labs  08/04/14 1151 08/05/14 0535  NA 137 138  K 3.1* 3.0*  CL 105 100  CO2 24 28  GLUCOSE 98 93  BUN 8 <5*  CREATININE 0.72 0.77  CALCIUM 7.8* 8.1*   PT/INR No results for input(s): LABPROT, INR in the last 72 hours. CMP     Component Value Date/Time   NA 138 08/05/2014 0535   NA 137 02/19/2014 1116   K 3.0* 08/05/2014 0535   K 4.2 02/19/2014 1116   CL 100 08/05/2014 0535   CL 106 08/28/2012 1138   CO2 28 08/05/2014 0535   CO2 27 02/19/2014 1116   GLUCOSE 93 08/05/2014 0535   GLUCOSE 104 02/19/2014 1116   GLUCOSE 130* 08/28/2012 1138   BUN <5* 08/05/2014 0535   BUN 12.7 02/19/2014 1116   CREATININE 0.77 08/05/2014 0535   CREATININE 1.1 02/19/2014 1116   CREATININE 1.30 07/29/2012 0219   CALCIUM 8.1* 08/05/2014 0535   CALCIUM 9.7 02/19/2014 1116   PROT 7.2 08/05/2014 0535   PROT 9.1*  02/19/2014 1116   ALBUMIN 2.3* 08/05/2014 0535   ALBUMIN 3.8 02/19/2014 1116   AST 70* 08/05/2014 0535   AST 35* 02/19/2014 1116   ALT 64* 08/05/2014 0535   ALT 25 02/19/2014 1116   ALKPHOS 108 08/05/2014 0535   ALKPHOS 76 02/19/2014 1116   BILITOT 1.3* 08/05/2014 0535   BILITOT 0.98 02/19/2014 1116   GFRNONAA 89* 08/05/2014 0535   GFRAA >90 08/05/2014 0535   Lipase     Component Value Date/Time   LIPASE 41 08/03/2014 0730       Studies/Results: Dg Cholangiogram Operative  08/04/2014   CLINICAL DATA:  Laparoscopic cholecystectomy  EXAM: INTRAOPERATIVE CHOLANGIOGRAM  FLUOROSCOPY TIME:  8 seconds  COMPARISON:  MRCP - 08/01/2014; abdominal ultrasound - 08/01/2014  FINDINGS: Intraoperative cholangiographic images of the right upper abdominal quadrant during laparoscopic cholecystectomy are provided for review.  Surgical clips overlie the expected location of the gallbladder fossa.  Contrast injection demonstrates selective cannulation of the central aspect of the cystic duct.  There is passage of contrast through the central aspect of the cystic duct with filling of a non dilated common bile duct. There is passage of contrast though the CBD and into the descending portion of the duodenum.  There  is minimal reflux of injected contrast into the common hepatic duct and central aspect of the non dilated intrahepatic biliary system. There is minimal opacification of central aspect of the pancreatic duct which appears nondilated.  Several bubbles are noted immediately cranial to the insertion of the cystic duct. There are no discrete persistent filling defects within the opacified portions of the biliary system to suggest the presence of choledocholithiasis.  IMPRESSION: No evidence of choledocholithiasis.   Electronically Signed   By: Sandi Mariscal M.D.   On: 08/04/2014 16:44    Anti-infectives: Anti-infectives    Start     Dose/Rate Route Frequency Ordered Stop   08/01/14 1200   Ampicillin-Sulbactam (UNASYN) 3 g in sodium chloride 0.9 % 100 mL IVPB  Status:  Discontinued     3 g 100 mL/hr over 60 Minutes Intravenous Every 6 hours 08/01/14 0511 08/01/14 1051   08/01/14 1200  cefTRIAXone (ROCEPHIN) 2 g in dextrose 5 % 50 mL IVPB - Premix     2 g 100 mL/hr over 30 Minutes Intravenous Every 24 hours 08/01/14 1051     08/01/14 0530  Ampicillin-Sulbactam (UNASYN) 3 g in sodium chloride 0.9 % 100 mL IVPB     3 g 100 mL/hr over 60 Minutes Intravenous  Once 08/01/14 0502 08/01/14 0641       Assessment/Plan  POD 1, s/p lap chole with IOC (negative for stone)/acute cholecystitis -clear liquids, advance diet as tolerates -agree with PT to mobilize patient -pulm toilet Dementia -per med DVT prophylaxis -heparin/SCDs  LOS: 4 days    Bill Yohn E 08/05/2014, 10:45 AM Pager: [phone]

## 2014-08-05 NOTE — Progress Notes (Signed)
CSW (Clinical Education officer, museum) received consult. RNCM notified CSW that at this time, PT note indicates pt can dc home. Please reconsult if social work needs arise.  Johnson, [place]

## 2014-08-06 ENCOUNTER — Inpatient Hospital Stay (HOSPITAL_COMMUNITY): Payer: Medicare Other

## 2014-08-06 LAB — COMPREHENSIVE METABOLIC PANEL
ALBUMIN: 2.2 g/dL — AB (ref 3.5–5.2)
ALT: 70 U/L — ABNORMAL HIGH (ref 0–53)
ANION GAP: 9 (ref 5–15)
AST: 101 U/L — ABNORMAL HIGH (ref 0–37)
Alkaline Phosphatase: 119 U/L — ABNORMAL HIGH (ref 39–117)
BUN: 5 mg/dL — ABNORMAL LOW (ref 6–23)
CHLORIDE: 101 mmol/L (ref 96–112)
CO2: 26 mmol/L (ref 19–32)
CREATININE: 0.83 mg/dL (ref 0.50–1.35)
Calcium: 7.9 mg/dL — ABNORMAL LOW (ref 8.4–10.5)
GFR calc non Af Amer: 86 mL/min — ABNORMAL LOW (ref >90)
Glucose, Bld: 105 mg/dL — ABNORMAL HIGH (ref 70–99)
Potassium: 3.1 mmol/L — ABNORMAL LOW (ref 3.5–5.1)
SODIUM: 136 mmol/L (ref 135–145)
Total Bilirubin: 1.4 mg/dL — ABNORMAL HIGH (ref 0.3–1.2)
Total Protein: 6.9 g/dL (ref 6.0–8.3)

## 2014-08-06 LAB — CBC
HEMATOCRIT: 41.8 % (ref 39.0–52.0)
HEMOGLOBIN: 14.1 g/dL (ref 13.0–17.0)
MCH: 31.8 pg (ref 26.0–34.0)
MCHC: 33.7 g/dL (ref 30.0–36.0)
MCV: 94.1 fL (ref 78.0–100.0)
Platelets: 223 10*3/uL (ref 150–400)
RBC: 4.44 MIL/uL (ref 4.22–5.81)
RDW: 13.6 % (ref 11.5–15.5)
WBC: 12.2 10*3/uL — ABNORMAL HIGH (ref 4.0–10.5)

## 2014-08-06 MED ORDER — POTASSIUM CHLORIDE CRYS ER 20 MEQ PO TBCR
40.0000 meq | EXTENDED_RELEASE_TABLET | Freq: Two times a day (BID) | ORAL | Status: DC
  Administered 2014-08-06 – 2014-08-08 (×5): 40 meq via ORAL
  Filled 2014-08-06 (×6): qty 2

## 2014-08-06 MED ORDER — PRASUGREL HCL 10 MG PO TABS
10.0000 mg | ORAL_TABLET | Freq: Every day | ORAL | Status: DC
  Administered 2014-08-06 – 2014-08-08 (×3): 10 mg via ORAL
  Filled 2014-08-06 (×3): qty 1

## 2014-08-06 MED ORDER — VILAZODONE HCL 10 MG PO TABS
20.0000 mg | ORAL_TABLET | Freq: Every day | ORAL | Status: DC
  Administered 2014-08-07 – 2014-08-08 (×2): 20 mg via ORAL
  Filled 2014-08-06 (×2): qty 2

## 2014-08-06 NOTE — Discharge Instructions (Signed)
CCS ______[facility] °LAPAROSCOPIC SURGERY: POST OP INSTRUCTIONS °Always review your discharge instruction sheet given to you by the facility where your surgery was performed. °IF YOU HAVE DISABILITY OR FAMILY LEAVE FORMS, YOU MUST BRING THEM TO THE OFFICE FOR PROCESSING.   °DO NOT GIVE THEM TO YOUR DOCTOR. ° °1. A prescription for pain medication may be given to you upon discharge.  Take your pain medication as prescribed, if needed.  If narcotic pain medicine is not needed, then you may take acetaminophen (Tylenol) or ibuprofen (Advil) as needed. °2. Take your usually prescribed medications unless otherwise directed. °3. If you need a refill on your pain medication, please contact your pharmacy.  They will contact our office to request authorization. Prescriptions will not be filled after 5pm or on week-ends. °4. You should follow a light diet the first few days after arrival home, such as soup and crackers, etc.  Be sure to include lots of fluids daily. °5. Most patients will experience some swelling and bruising in the area of the incisions.  Ice packs will help.  Swelling and bruising can take several days to resolve.  °6. It is common to experience some constipation if taking pain medication after surgery.  Increasing fluid intake and taking a stool softener (such as Colace) will usually help or prevent this problem from occurring.  A mild laxative (Milk of Magnesia or Miralax) should be taken according to package instructions if there are no bowel movements after 48 hours. °7. Unless discharge instructions indicate otherwise, you may remove your bandages 24-48 hours after surgery, and you may shower at that time.  You may have steri-strips (small skin tapes) in place directly over the incision.  These strips should be left on the skin for 7-10 days.  If your surgeon used skin glue on the incision, you may shower in 24 hours.  The glue will flake off over the next 2-3 weeks.  Any sutures or  staples will be removed at the office during your follow-up visit. °8. ACTIVITIES:  You may resume regular (light) daily activities beginning the next day--such as daily self-care, walking, climbing stairs--gradually increasing activities as tolerated.  You may have sexual intercourse when it is comfortable.  Refrain from any heavy lifting or straining until approved by your doctor. °a. You may drive when you are no longer taking prescription pain medication, you can comfortably wear a seatbelt, and you can safely maneuver your car and apply brakes. °b. RETURN TO WORK:  __________________________________________________________ °9. You should see your doctor in the office for a follow-up appointment approximately 2-3 weeks after your surgery.  Make sure that you call for this appointment within a day or two after you arrive home to insure a convenient appointment time. °10. OTHER INSTRUCTIONS: __________________________________________________________________________________________________________________________ __________________________________________________________________________________________________________________________ °WHEN TO CALL YOUR DOCTOR: °1. Fever over 101.0 °2. Inability to urinate °3. Continued bleeding from incision. °4. Increased pain, redness, or drainage from the incision. °5. Increasing abdominal pain ° °The clinic staff is available to answer your questions during regular business hours.  Please don’t hesitate to call and ask to speak to one of the nurses for clinical concerns.  If you have a medical emergency, go to the nearest emergency room or call 911.  A surgeon from [facility] is always on call at the hospital. °[address], [address], [place], [place]  [address] ? [address] [address], [place], [place]   [address] °[phone] ? [phone] ? FAX [phone] °Web site:   [email or web address] °

## 2014-08-06 NOTE — Care Management (Signed)
CARE MANAGEMENT NOTE 08/06/2014  Patient:  Tyler Mayo, Tyler Mayo   Account Number:  [id number]  Date Initiated:  08/06/2014  Documentation initiated by:  Vernida Mcnicholas  Subjective/Objective Assessment:   CM following for progression and d/c planning.     Action/Plan:   CM following met with pt wife on 08/04/2014 re [place] needs. She needs in home caregiver while she works, however insurance does not cover this . this CM give her a list of private [facility] aide services. She also has a network of friends to assist.   Anticipated DC Date:  08/07/2014   Anticipated DC Plan:  [place]         Choice offered to / List presented to:          [facility] arranged  HH-1 RN  [place].   Status of service:  In process, will continue to follow Medicare Important Message given?  YES (If response is "NO", the following Medicare IM given date fields will be blank) Date Medicare IM given:  08/06/2014 Medicare IM given by:  Eveline Sauve Date Additional Medicare IM given:   Additional Medicare IM given by:    Discharge Disposition:    Per UR Regulation:    If discussed at Long Length of Stay Meetings, dates discussed:    Comments:  08/06/2014 Pt may require short term rehab at a SNF. Will continue to follow. Hopefully pt will be able to d/c to home with [facility] services. PT will reeval pt on Saturday 08/07/2014 and make recommendations there is concern right now that he will not be managable at home.    CRoyal RN MPH, case manager, [phone]

## 2014-08-06 NOTE — Progress Notes (Signed)
[facility] PROGRESS NOTE  Tyler Mayo ANV:[id number] DOB: [birth year] DOA: 08/01/2014 PCP: Anthoney Harada, MD  Assessment/Plan: 1-Gallstone pancreatitis ; Cholecystitis.  - GI and [facility] following.  -on empiric rocephin and S/P cholecystectomy 4-27 and IOC.  -continue supportive care with antiemetics and as needed analgesics   2-Encephalopathy. Patient was  Lethargic 4-28. Suspect related to opioid. Patient more alert today. Per wife he is still sleeping a lot.  I will decrease Vibryd dose to home dose, 20 mg daily.  Will discontinue morphine. He has not received any more doses.  Klonopin as needed.  Will check CT head.   3-HTN: will continue PRN hydralazine and metoprolol.   4-hx of dementia with behavioral issues: continue PRN klonopin and daily vilazodone  5-hx of CAD: with stent in 2013 -patient effient on hold (for presumably intervention in am) -no CP or SOB -EKG w/o acute ischemic changes on admission -no abnormalities on telemetry -continue B-blocker. One time dose IV , patient unable to take orals.  -ok to resume effient per surgery.    6-Acute on chronic renal failure: stage 3 at baseline -due to pre-renal azotemia and decrease perfusion with spacing most likely -will continue IVF's, -Cr is back to normal (0.88 on 4/26)  7-hypokalemia: will replete with oral supplement.  8-Tachycardia; check EKG> continue with metoprolol.       Code Status: Full Family Communication: wife at bedside Disposition Plan: evaluation for encephalopathy   Consultants:  GI  CCS  Procedures:  See below for x-ray reports   Laparoscopic cholecystectomy 4/27  Antibiotics:  Rocephin   HPI/Subjective: He is more alert today.  He is alert to place , was able to recognized wife.  Denies abdominal pain. Had BM last night.   Objective: Filed Vitals:   08/06/14 0930  BP: 155/76  Pulse: 110  Temp: 97.7 F (36.5 C)  Resp: 18    Intake/Output Summary  (Last 24 hours) at 08/06/14 1456 Last data filed at 08/06/14 0645  Gross per 24 hour  Intake 606.25 ml  Output   2100 ml  Net -1493.75 ml   Filed Weights   08/03/14 2102 08/04/14 2143 08/05/14 2137  Weight: 95.8 kg (211 lb 3.2 oz) 98.5 kg (217 lb 2.5 oz) 97.5 kg (214 lb 15.2 oz)    Exam:   General: NAD  Cardiovascular: tachycardic, no rubs or gallops  Respiratory: no wheezing, no crackles  Abdomen: tender to palpation on RUQ, soft, no guarding, positive BS  Musculoskeletal: no edema, no cyanosis  Neuro. Non focal, had some limitation raising left arm initially but them when I asked him again he was able to raise left arm.   Data Reviewed: Basic Metabolic Panel:  Recent Labs Lab 08/02/14 0612 08/03/14 0730 08/04/14 1151 08/05/14 0535 08/06/14 0712  NA 135 138 137 138 136  K 3.3* 3.2* 3.1* 3.0* 3.1*  CL 101 103 105 100 101  CO2 '22 25 24 28 26  '$ GLUCOSE 92 101* 98 93 105*  BUN '7 9 8 '$ <5* 5*  CREATININE 0.90 0.88 0.72 0.77 0.83  CALCIUM 8.0* 8.2* 7.8* 8.1* 7.9*   Liver Function Tests:  Recent Labs Lab 08/02/14 0612 08/03/14 0730 08/04/14 1151 08/05/14 0535 08/06/14 0712  AST 193* 72* 43* 70* 101*  ALT 218* 118* 69* 64* 70*  ALKPHOS 139* 122* 98 108 119*  BILITOT 1.5* 1.4* 1.5* 1.3* 1.4*  PROT 8.3 7.9 6.7 7.2 6.9  ALBUMIN 3.0* 2.7* 2.3* 2.3* 2.2*    Recent Labs Lab 08/01/14  0050 08/01/14 1210 08/02/14 0612 08/03/14 0730  LIPASE >3000* 2747* 370* 41   CBC:  Recent Labs Lab 08/01/14 0050 08/02/14 0612 08/03/14 0730 08/05/14 0535 08/06/14 0712  WBC 7.0 11.1* 8.1 10.3 12.2*  NEUTROABS 5.6  --   --   --   --   HGB 14.2 14.7 13.8 14.0 14.1  HCT 42.3 43.9 41.6 42.1 41.8  MCV 95.5 96.3 95.6 94.6 94.1  PLT 394 342 279 239 223    Recent Results (from the past 240 hour(s))  Urine culture     Status: None   Collection Time: 07/28/14  9:15 PM  Result Value Ref Range Status   Specimen Description URINE, CLEAN CATCH  Final   Special Requests NONE   Final   Colony Count   Final    30,000 COLONIES/ML Performed at [facility]    Culture   Final    STAPHYLOCOCCUS SPECIES (COAGULASE NEGATIVE) Note: RIFAMPIN AND GENTAMICIN SHOULD NOT BE USED AS SINGLE DRUGS FOR TREATMENT OF STAPH INFECTIONS. Performed at [facility]    Report Status 07/31/2014 FINAL  Final   Organism ID, Bacteria STAPHYLOCOCCUS SPECIES (COAGULASE NEGATIVE)  Final      Susceptibility   Staphylococcus species (coagulase negative) - MIC*    GENTAMICIN >=16 RESISTANT Resistant     LEVOFLOXACIN >=8 RESISTANT Resistant     NITROFURANTOIN <=16 SENSITIVE Sensitive     OXACILLIN >=4 RESISTANT Resistant     PENICILLIN >=0.5 RESISTANT Resistant     RIFAMPIN <=0.5 SENSITIVE Sensitive     TRIMETH/SULFA 80 RESISTANT Resistant     VANCOMYCIN 1 SENSITIVE Sensitive     TETRACYCLINE 2 SENSITIVE Sensitive     * STAPHYLOCOCCUS SPECIES (COAGULASE NEGATIVE)  Surgical pcr screen     Status: None   Collection Time: 08/04/14  2:45 AM  Result Value Ref Range Status   MRSA, PCR NEGATIVE NEGATIVE Final   Staphylococcus aureus NEGATIVE NEGATIVE Final    Comment:        The Xpert SA Assay (FDA approved for NASAL specimens in patients over 17 years of age), is one component of a comprehensive surveillance program.  Test performance has been validated by [facility] for patients greater than or equal to 56 year old. It is not intended to diagnose infection nor to guide or monitor treatment.      Studies: Dg Cholangiogram Operative  08/04/2014   CLINICAL DATA:  Laparoscopic cholecystectomy  EXAM: INTRAOPERATIVE CHOLANGIOGRAM  FLUOROSCOPY TIME:  8 seconds  COMPARISON:  MRCP - 08/01/2014; abdominal ultrasound - 08/01/2014  FINDINGS: Intraoperative cholangiographic images of the right upper abdominal quadrant during laparoscopic cholecystectomy are provided for review.  Surgical clips overlie the expected location of the gallbladder fossa.  Contrast injection  demonstrates selective cannulation of the central aspect of the cystic duct.  There is passage of contrast through the central aspect of the cystic duct with filling of a non dilated common bile duct. There is passage of contrast though the CBD and into the descending portion of the duodenum.  There is minimal reflux of injected contrast into the common hepatic duct and central aspect of the non dilated intrahepatic biliary system. There is minimal opacification of central aspect of the pancreatic duct which appears nondilated.  Several bubbles are noted immediately cranial to the insertion of the cystic duct. There are no discrete persistent filling defects within the opacified portions of the biliary system to suggest the presence of choledocholithiasis.  IMPRESSION: No evidence of choledocholithiasis.  Electronically Signed   By: Sandi Mariscal M.D.   On: 08/04/2014 16:44   Ct Head Wo Contrast  08/06/2014   CLINICAL DATA:  Lethargic.  EXAM: CT HEAD WITHOUT CONTRAST  TECHNIQUE: Contiguous axial images were obtained from the base of the skull through the vertex without intravenous contrast.  COMPARISON:  Brain MRI 10/17/2013 and CT 10/16/2013  FINDINGS: Moderate cerebral atrophy is unchanged. There is no evidence of acute cortical infarct, intracranial hemorrhage, mass, midline shift, or extra-axial fluid collection. Periventricular white-matter hypodensities are unchanged and nonspecific but compatible with mild chronic small vessel ischemic disease. Chronic left caudate lacunar infarct is noted.  Prior bilateral cataract extraction is noted. Mastoid air cells and visualized paranasal sinuses are clear. Mild carotid siphon calcification is noted.  IMPRESSION: No evidence of acute intracranial abnormality.   Electronically Signed   By: Logan Bores   On: 08/06/2014 14:34    Scheduled Meds: . aspirin EC  81 mg Oral Daily  . cefTRIAXone (ROCEPHIN)  IV  2 g Intravenous Q24H  . heparin  5,000 Units Subcutaneous  3 times per day  . metoprolol tartrate  25 mg Oral BID  . potassium chloride  40 mEq Oral BID  . prasugrel  10 mg Oral Daily  . [START ON 08/07/2014] Vilazodone HCl  20 mg Oral Daily   Continuous Infusions: . dextrose 5 % and 0.9% NaCl 75 mL/hr at 08/06/14 0545    Principal Problem:   Gallstone pancreatitis Active Problems:   Choledocholithiasis with acute cholecystitis   HTN (hypertension)   Cholecystitis   Dementia with behavioral disturbance   Hypokalemia   AKI (acute kidney injury)    Time spent: 30 minutes    Moishy Laday, [facility] Pager [phone]. If 7PM-7AM, please contact night-coverage at [email or web address], password [facility] 08/06/2014, 2:56 PM  LOS: 5 days

## 2014-08-06 NOTE — Evaluation (Signed)
Occupational Therapy Evaluation Patient Details Name: Tyler Mayo MRN: [id number] DOB: 10-Oct-1940 Today's Date: 08/06/2014    History of Present Illness Patient is a 74 yo male admitted 08/01/14 with abdominal pain.  Patient now s/p lap cholecystecomy on 08/04/14.  PMH:  Dementia, CAD, back pain, depression, seizures, anxiety   Clinical Impression   This 74 yo male admitted and underwent above presents to acute OT with semi-lethargic state, decreased safety awareness, decreased mobility, decreased balance, decreased cognition all affecting his ability to care for himself at an independent level as he was pta (with wife working and pt still at home alone). He will benefit from acute OT with follow up OT on CIR to get to a S level or better so wife can manage him at home.    Follow Up Recommendations  SNF    Equipment Recommendations   (TBD at next venue)       Precautions / Restrictions Precautions Precautions: Fall Restrictions Weight Bearing Restrictions: No      Mobility Bed Mobility Overal bed mobility: Needs Assistance Bed Mobility: Rolling;Sidelying to Sit Rolling: Supervision (with VCs for hand placement) Sidelying to sit: Supervision          Transfers Overall transfer level: Needs assistance Equipment used: 1 person hand held assist Transfers: Sit to/from Stand Sit to Stand: Min assist                   ADL Overall ADL's : Needs assistance/impaired Eating/Feeding: Set up;Supervision/ safety;Sitting   Grooming: Set up;Supervision/safety;Sitting   Upper Body Bathing: Set up;Supervision/ safety;Sitting   Lower Body Bathing: Moderate assistance (with min A sit<>stand)   Upper Body Dressing : Moderate assistance;Sitting   Lower Body Dressing: Maximal assistance (with min A sit<>stand)   Toilet Transfer: Minimal assistance;Ambulation;Regular Toilet;Grab bars;RW   Toileting- Clothing Manipulation and Hygiene: Total assistance (with min A  sit<>stand)               Vision Additional Comments: No change from baseline          Pertinent Vitals/Pain Pain Assessment: No/denies pain     Hand Dominance Right   Extremity/Trunk Assessment Upper Extremity Assessment Upper Extremity Assessment: Overall WFL for tasks assessed           Communication Communication Communication: No difficulties   Cognition Arousal/Alertness:  (lethargic to start, then was wide awake once I got him woken up) Behavior During Therapy: WFL for tasks assessed/performed Overall Cognitive Status: Impaired/Different from baseline Area of Impairment: Orientation;Following commands;Safety/judgement;Problem solving Orientation Level:  (He knew we were in [place] (but he said [place]) and he knew he was at [employer])     Following Commands: Follows one step commands inconsistently;Follows one step commands with increased time (and increased VCs, tactile cues, and gestural cues) Safety/Judgement: Decreased awareness of safety (turned and just plopped down on the bed)   Problem Solving: Slow processing;Decreased initiation;Difficulty sequencing;Requires verbal cues;Requires tactile cues General Comments: Had difficulty manuvering RW (now he is not use to using one), but even with cues he had a hard time. One the way out of the bathroom he stood up fine and took 3 steps forward to the corner of the sink and then just stopped--I had to offer him my hand to get him to keep moving. We got to the end of the sink counter and he stopped again, I had to VC him that we were headed to the bed and he "took off" towards bed. Pt was incontinent  of urine and feces upon arrival and he was unaware.              Home Living Family/patient expects to be discharged to:: Inpatient rehab Living Arrangements: Spouse/significant other Available Help at Discharge: Family;Available 24 hours/day (wife working with family v. hired sitters for 24/7) Type of  Home: House Home Access: Stairs to enter Technical brewer of Steps: 3 Entrance Stairs-Rails: [place]: One level               [place]: None   Additional Comments: Wife works and patient had been home alone prior to this admission.      Prior Functioning/Environment Level of Independence: Independent;Needs assistance  Gait / Transfers Assistance Needed: Ambulates independently.  Is in bed most of the time due to back pain per wife. ADL's / Homemaking Assistance Needed: Wife reports she assists patient with bath due to back pain--thus usually did sponge baths        OT Diagnosis: Generalized weakness;Cognitive deficits   OT Problem List: Decreased strength;Decreased activity tolerance;Impaired balance (sitting and/or standing);Decreased safety awareness;Decreased cognition;Decreased knowledge of use of DME or AE   OT Treatment/Interventions: Self-care/ADL training;Patient/family education;Balance training;DME and/or AE instruction;Therapeutic activities    OT Goals(Current goals can be found in the care plan section) Acute Rehab OT Goals Patient Stated Goal: wife--to get him better than he is, since she cannot manage him at home like he currently is OT Goal Formulation: With family Time For Goal Achievement: 08/13/14 Potential to Achieve Goals: Good  OT Frequency: Min 2X/week              End of Session Equipment Utilized During Treatment: Rolling walker Nurse Communication:  (Made sure it was OK to see pt before he went for CT)  Activity Tolerance: Patient limited by lethargy (did fine while I had him up, but as soon as he laid down he shut his eyes again) Patient left: in bed (being pused down for head CT)   Time: 1341-1422 OT Time Calculation (min): 41 min Charges:  OT General Charges $OT Visit: 1 Procedure OT Evaluation $Initial OT Evaluation Tier I: 1 Procedure OT Treatments $Self Care/Home Management : 23-37 mins  Almon Register [phone] 08/06/2014, 2:45 PM

## 2014-08-06 NOTE — Progress Notes (Signed)
Patient ID: Tyler Mayo, male   DOB: [birth year], 74 y.o.   MRN: [id number] 2 Days Post-Op  Subjective: Pt feels well today.  Upset with wife for feeding him so he spit his food out on the floor.  No pain, no nausea.  Wants some solid food  Objective: Vital signs in last 24 hours: Temp:  [97.7 F (36.5 C)-98.6 F (37 C)] 97.7 F (36.5 C) (04/29 0930) Pulse Rate:  [94-110] 110 (04/29 0930) Resp:  [16-18] 18 (04/29 0930) BP: (152-186)/(76-98) 155/76 mmHg (04/29 0930) SpO2:  [95 %-96 %] 96 % (04/29 0930) Weight:  [97.5 kg (214 lb 15.2 oz)] 97.5 kg (214 lb 15.2 oz) (04/28 2137) Last BM Date: 08/04/14  Intake/Output from previous day: 04/28 0701 - 04/29 0700 In: 973.3 [P.O.:600; I.V.:323.3; IV Piggyback:50] Out: 2600 [Urine:2600] Intake/Output this shift:    PE: Abd: soft, essentially NT, +BS, incisions c/d/i with gauze and tegaderm in place  Lab Results:   Recent Labs  08/05/14 0535 08/06/14 0712  WBC 10.3 12.2*  HGB 14.0 14.1  HCT 42.1 41.8  PLT 239 223   BMET  Recent Labs  08/05/14 0535 08/06/14 0712  NA 138 136  K 3.0* 3.1*  CL 100 101  CO2 28 26  GLUCOSE 93 105*  BUN <5* 5*  CREATININE 0.77 0.83  CALCIUM 8.1* 7.9*   PT/INR No results for input(s): LABPROT, INR in the last 72 hours. CMP     Component Value Date/Time   NA 136 08/06/2014 0712   NA 137 02/19/2014 1116   K 3.1* 08/06/2014 0712   K 4.2 02/19/2014 1116   CL 101 08/06/2014 0712   CL 106 08/28/2012 1138   CO2 26 08/06/2014 0712   CO2 27 02/19/2014 1116   GLUCOSE 105* 08/06/2014 0712   GLUCOSE 104 02/19/2014 1116   GLUCOSE 130* 08/28/2012 1138   BUN 5* 08/06/2014 0712   BUN 12.7 02/19/2014 1116   CREATININE 0.83 08/06/2014 0712   CREATININE 1.1 02/19/2014 1116   CREATININE 1.30 07/29/2012 0219   CALCIUM 7.9* 08/06/2014 0712   CALCIUM 9.7 02/19/2014 1116   PROT 6.9 08/06/2014 0712   PROT 9.1* 02/19/2014 1116   ALBUMIN 2.2* 08/06/2014 0712   ALBUMIN 3.8 02/19/2014 1116   AST 101*  08/06/2014 0712   AST 35* 02/19/2014 1116   ALT 70* 08/06/2014 0712   ALT 25 02/19/2014 1116   ALKPHOS 119* 08/06/2014 0712   ALKPHOS 76 02/19/2014 1116   BILITOT 1.4* 08/06/2014 0712   BILITOT 0.98 02/19/2014 1116   GFRNONAA 86* 08/06/2014 0712   GFRAA >90 08/06/2014 0712   Lipase     Component Value Date/Time   LIPASE 41 08/03/2014 0730       Studies/Results: Dg Cholangiogram Operative  08/04/2014   CLINICAL DATA:  Laparoscopic cholecystectomy  EXAM: INTRAOPERATIVE CHOLANGIOGRAM  FLUOROSCOPY TIME:  8 seconds  COMPARISON:  MRCP - 08/01/2014; abdominal ultrasound - 08/01/2014  FINDINGS: Intraoperative cholangiographic images of the right upper abdominal quadrant during laparoscopic cholecystectomy are provided for review.  Surgical clips overlie the expected location of the gallbladder fossa.  Contrast injection demonstrates selective cannulation of the central aspect of the cystic duct.  There is passage of contrast through the central aspect of the cystic duct with filling of a non dilated common bile duct. There is passage of contrast though the CBD and into the descending portion of the duodenum.  There is minimal reflux of injected contrast into the common hepatic duct and central aspect  of the non dilated intrahepatic biliary system. There is minimal opacification of central aspect of the pancreatic duct which appears nondilated.  Several bubbles are noted immediately cranial to the insertion of the cystic duct. There are no discrete persistent filling defects within the opacified portions of the biliary system to suggest the presence of choledocholithiasis.  IMPRESSION: No evidence of choledocholithiasis.   Electronically Signed   By: Sandi Mariscal M.D.   On: 08/04/2014 16:44    Anti-infectives: Anti-infectives    Start     Dose/Rate Route Frequency Ordered Stop   08/01/14 1200  Ampicillin-Sulbactam (UNASYN) 3 g in sodium chloride 0.9 % 100 mL IVPB  Status:  Discontinued     3  g 100 mL/hr over 60 Minutes Intravenous Every 6 hours 08/01/14 0511 08/01/14 1051   08/01/14 1200  cefTRIAXone (ROCEPHIN) 2 g in dextrose 5 % 50 mL IVPB - Premix     2 g 100 mL/hr over 30 Minutes Intravenous Every 24 hours 08/01/14 1051     08/01/14 0530  Ampicillin-Sulbactam (UNASYN) 3 g in sodium chloride 0.9 % 100 mL IVPB     3 g 100 mL/hr over 60 Minutes Intravenous  Once 08/01/14 0502 08/01/14 0641       Assessment/Plan   POD 2, s/p lap chole with IOC (negative for stone)/acute cholecystitis -regular diet -pulm toilet and mobilize -patient is surgically stable for dc home from our standpoint.  His follow up and discharge instruction has been written down and discussed with wife.  He will be a prn for [place] over the weekend if he remains inpatient. Dementia -per med DVT prophylaxis -heparin/SCDs  LOS: 5 days    Casimiro Lienhard E 08/06/2014, 10:09 AM Pager: [phone]

## 2014-08-07 LAB — BASIC METABOLIC PANEL
Anion gap: 11 (ref 5–15)
BUN: 5 mg/dL — AB (ref 6–23)
CALCIUM: 7.9 mg/dL — AB (ref 8.4–10.5)
CO2: 25 mmol/L (ref 19–32)
CREATININE: 0.82 mg/dL (ref 0.50–1.35)
Chloride: 102 mmol/L (ref 96–112)
GFR calc Af Amer: >90 mL/min (ref >90)
GFR, EST NON AFRICAN AMERICAN: 87 mL/min — AB (ref >90)
GLUCOSE: 103 mg/dL — AB (ref 70–99)
Potassium: 3 mmol/L — ABNORMAL LOW (ref 3.5–5.1)
Sodium: 138 mmol/L (ref 135–145)

## 2014-08-07 LAB — CBC
HEMATOCRIT: 37.3 % — AB (ref 39.0–52.0)
HEMOGLOBIN: 12.8 g/dL — AB (ref 13.0–17.0)
MCH: 31.8 pg (ref 26.0–34.0)
MCHC: 34.3 g/dL (ref 30.0–36.0)
MCV: 92.6 fL (ref 78.0–100.0)
Platelets: 231 10*3/uL (ref 150–400)
RBC: 4.03 MIL/uL — ABNORMAL LOW (ref 4.22–5.81)
RDW: 13.6 % (ref 11.5–15.5)
WBC: 12.3 10*3/uL — ABNORMAL HIGH (ref 4.0–10.5)

## 2014-08-07 MED ORDER — POTASSIUM CHLORIDE CRYS ER 20 MEQ PO TBCR: 40.0000 meq | EXTENDED_RELEASE_TABLET | Freq: Two times a day (BID) | ORAL | Status: AC

## 2014-08-07 MED ORDER — AMOXICILLIN-POT CLAVULANATE 875-125 MG PO TABS: 1.0000 | ORAL_TABLET | Freq: Two times a day (BID) | ORAL | Status: AC

## 2014-08-07 MED ORDER — VILAZODONE HCL 40 MG PO TABS: 20.0000 mg | ORAL_TABLET | Freq: Every day | ORAL | Status: AC

## 2014-08-07 MED ORDER — HYDROCODONE-ACETAMINOPHEN 5-325 MG PO TABS: 1.0000 | ORAL_TABLET | Freq: Three times a day (TID) | ORAL | Status: AC | PRN

## 2014-08-07 MED ORDER — HYDROCODONE-ACETAMINOPHEN 5-325 MG PO TABS: 1.0000 | ORAL_TABLET | Freq: Four times a day (QID) | ORAL | Status: DC | PRN

## 2014-08-07 NOTE — Clinical Social Work Placement (Signed)
   CLINICAL SOCIAL WORK PLACEMENT  NOTE  Date:  08/07/2014  Patient Details  Name: Tyler Mayo MRN: [id number] Date of Birth: 11-15-1940  Clinical Social Work is seeking post-discharge placement for this patient at the [place] level of care (*CSW will initial, date and re-position this form in  chart as items are completed):  Yes   Patient/family provided with [facility] Work Department's list of facilities offering this level of care within the geographic area requested by the patient (or if unable, by the patient's family).  Yes   Patient/family informed of their freedom to choose among providers that offer the needed level of care, that participate in Medicare, Medicaid or managed care program needed by the patient, have an available bed and are willing to accept the patient.  Yes   Patient/family informed of [facility]'s ownership interest in [facility] and [facility], as well as of the fact that they are under no obligation to receive care at these facilities.  PASRR submitted to EDS on 08/07/14     PASRR number received on 08/07/14     Existing PASRR number confirmed on       FL2 transmitted to all facilities in geographic area requested by pt/family on 08/07/14     FL2 transmitted to all facilities within larger geographic area on       Patient informed that his/her managed care company has contracts with or will negotiate with certain facilities, including the following:            Patient/family informed of bed offers received.  Patient chooses bed at       Physician recommends and patient chooses bed at      Patient to be transferred to   on  .  Patient to be transferred to facility by       Patient family notified on   of transfer.  Name of family member notified:        PHYSICIAN Please sign FL2, Please prepare priority discharge summary, including medications, Please prepare prescriptions     Additional  Comment:    [employer]air Laundry Weekend CSW [phone]

## 2014-08-07 NOTE — Clinical Social Work Note (Signed)
Clinical Social Work Assessment  Patient Details  Name: Tyler Mayo MRN: [id number] Date of Birth: 27-Aug-1940  Date of referral:  08/07/14               Reason for consult:  Facility Placement                Permission sought to share information with:  Facility Sport and exercise psychologist, Family Supports Permission granted to share information::  Yes, Verbal Permission Granted  Name::     Brevon Dewald   Relationship::  wife  Contact Information:  [phone]  Housing/Transportation Living arrangements for the past 2 months:  [place] of Information:  Patient, Spouse Patient Interpreter Needed:  None Criminal Activity/Legal Involvement Pertinent to Current Situation/Hospitalization:  No - Comment as needed Significant Relationships:  Spouse Lives with:  Spouse Do you feel safe going back to the place where you live?  No Need for family participation in patient care:  Yes (Comment)  Care giving concerns:  Pt with decreased mobility; concern for pt wife to manage pt at home   Social Worker assessment / plan:  CSW visited pt room and spoke with pt about SNF at bedside. Pt was able to follow conversation and agree to SNF however pt had to be woken up multiple times to continue conversation. CSW asked if pt wife could be contacted and pt agreed. Pt wife stated that they are aware of SNF recommendation. Pt wife confused as she thought she was told RNCM would be doing this on Friday and that placement cannot be found over the weekend. CSW explained social work role and that facilities are able to accept pt and provide bed offers over the weekend. Pt wife understanding and agreeable for pt to dc tomorrow if bed is available and MD feels pt is medically stable.    Employment status:  Retired Nurse, adult PT Recommendations:  [address] [place] / Referral to community resources:  [place]  Patient/Family's Response  to care: Pt and pt wife both in agreement that SNF for ST rehab will be safest dc plan  Patient/Family's Understanding of and Emotional Response to Diagnosis, Current Treatment, and Prognosis:  Unable to assess pt response due to pt cognition during assessment. Pt wife demonstrated good understanding of pt condition. Pt wife calm through out assessment but difficult to assess emotional response over the phone.  Emotional Assessment Appearance:  Well-Groomed, Appears stated age Attitude/Demeanor/Rapport:  Lethargic Affect (typically observed):  Appropriate, Calm, Quiet Orientation:  Oriented to Self Alcohol / Substance use:  Not Applicable Psych involvement (Current and /or in the community):  No (Comment)  Discharge Needs  Concerns to be addressed:  Discharge Planning Concerns, Home Safety Concerns Readmission within the last 30 days:  No Current discharge risk:  Dependent with Mobility Barriers to Discharge:  Continued Medical Work up   [employer], Latanya Presser Weekend CSW [phone]

## 2014-08-07 NOTE — Progress Notes (Signed)
Went to patient's room to assist him with toileting,when RN asked if he was okay to stand with RN assisting him,patient got upset and stated "you have been rude to me",when RN asked how she was rude,patient got more upset. At this time charge nurse was called to talk to patient. Merlina Marchena, Wonda Cheng, Therapist, sports

## 2014-08-07 NOTE — Progress Notes (Signed)
[facility] PROGRESS NOTE  Tyler Mayo EUM:[id number] DOB: [birth year] DOA: 08/01/2014 PCP: Anthoney Harada, MD  Assessment/Plan: 1-Gallstone pancreatitis ; Cholecystitis.  - GI and CCS following.  -on empiric rocephin and S/P cholecystectomy 4-27 and IOC.  -continue supportive care with antiemetics and as needed analgesics   2-Encephalopathy. Patient was  Lethargic 4-28. Suspect related to opioid. Patient more alert today. Per wife he is still sleeping a lot.  I will decrease Vibryd dose to home dose, 20 mg daily.  Will discontinue morphine. He has not received any more doses.  Klonopin as needed. Resume home dose Vicodin.  CT head negative  3-HTN: will continue PRN hydralazine and metoprolol.   4-hx of dementia with behavioral issues: continue PRN klonopin and daily vilazodone  5-hx of CAD: with stent in 2013 -patient effient on hold (for presumably intervention in am) -no CP or SOB -EKG w/o acute ischemic changes on admission -no abnormalities on telemetry -continue B-blocker. One time dose IV , patient unable to take orals.  -ok to resume effient per surgery.    6-Acute on chronic renal failure: stage 3 at baseline -due to pre-renal azotemia and decrease perfusion with spacing most likely -will continue IVF's, -Cr is back to normal (0.88 on 4/26)  7-hypokalemia: will replete with oral supplement.  8-Tachycardia; check EKG> continue with metoprolol.       Code Status: Full Family Communication: wife at bedside Disposition Plan: patient requiring more assistance on ambulation. Will cosnult PT>    Consultants:  GI  CCS  Procedures:  See below for x-ray reports   Laparoscopic cholecystectomy 4/27  Antibiotics:  Rocephin   HPI/Subjective: He is more alert today.  Wife think he is back to baseline.  Will see how patient does with mobility   Objective: Filed Vitals:   08/07/14 0834  BP: 160/85  Pulse: 100  Temp: 98.7 F (37.1 C)   Resp: 17    Intake/Output Summary (Last 24 hours) at 08/07/14 1322 Last data filed at 08/07/14 0925  Gross per 24 hour  Intake   2220 ml  Output    700 ml  Net   1520 ml   Filed Weights   08/04/14 2143 08/05/14 2137 08/06/14 2007  Weight: 98.5 kg (217 lb 2.5 oz) 97.5 kg (214 lb 15.2 oz) 95.1 kg (209 lb 10.5 oz)    Exam:   General: NAD  Cardiovascular: RRR  Respiratory: no wheezing, no crackles  Abdomen: tender to palpation on RUQ, soft, no guarding, positive BS  Musculoskeletal: no edema, no cyanosis  Neuro. Non focal.   Data Reviewed: Basic Metabolic Panel:  Recent Labs Lab 08/03/14 0730 08/04/14 1151 08/05/14 0535 08/06/14 0712 08/07/14 0720  NA 138 137 138 136 138  K 3.2* 3.1* 3.0* 3.1* 3.0*  CL 103 105 100 101 102  CO2 '25 24 28 26 25  '$ GLUCOSE 101* 98 93 105* 103*  BUN 9 8 <5* 5* 5*  CREATININE 0.88 0.72 0.77 0.83 0.82  CALCIUM 8.2* 7.8* 8.1* 7.9* 7.9*   Liver Function Tests:  Recent Labs Lab 08/02/14 0612 08/03/14 0730 08/04/14 1151 08/05/14 0535 08/06/14 0712  AST 193* 72* 43* 70* 101*  ALT 218* 118* 69* 64* 70*  ALKPHOS 139* 122* 98 108 119*  BILITOT 1.5* 1.4* 1.5* 1.3* 1.4*  PROT 8.3 7.9 6.7 7.2 6.9  ALBUMIN 3.0* 2.7* 2.3* 2.3* 2.2*    Recent Labs Lab 08/01/14 0050 08/01/14 1210 08/02/14 0612 08/03/14 0730  LIPASE >3000* 2747* 370* 41  CBC:  Recent Labs Lab 08/01/14 0050 08/02/14 0612 08/03/14 0730 08/05/14 0535 08/06/14 0712  WBC 7.0 11.1* 8.1 10.3 12.2*  NEUTROABS 5.6  --   --   --   --   HGB 14.2 14.7 13.8 14.0 14.1  HCT 42.3 43.9 41.6 42.1 41.8  MCV 95.5 96.3 95.6 94.6 94.1  PLT 394 342 279 239 223    Recent Results (from the past 240 hour(s))  Urine culture     Status: None   Collection Time: 07/28/14  9:15 PM  Result Value Ref Range Status   Specimen Description URINE, CLEAN CATCH  Final   Special Requests NONE  Final   Colony Count   Final    30,000 COLONIES/ML Performed at [facility]     Culture   Final    STAPHYLOCOCCUS SPECIES (COAGULASE NEGATIVE) Note: RIFAMPIN AND GENTAMICIN SHOULD NOT BE USED AS SINGLE DRUGS FOR TREATMENT OF STAPH INFECTIONS. Performed at [facility]    Report Status 07/31/2014 FINAL  Final   Organism ID, Bacteria STAPHYLOCOCCUS SPECIES (COAGULASE NEGATIVE)  Final      Susceptibility   Staphylococcus species (coagulase negative) - MIC*    GENTAMICIN >=16 RESISTANT Resistant     LEVOFLOXACIN >=8 RESISTANT Resistant     NITROFURANTOIN <=16 SENSITIVE Sensitive     OXACILLIN >=4 RESISTANT Resistant     PENICILLIN >=0.5 RESISTANT Resistant     RIFAMPIN <=0.5 SENSITIVE Sensitive     TRIMETH/SULFA 80 RESISTANT Resistant     VANCOMYCIN 1 SENSITIVE Sensitive     TETRACYCLINE 2 SENSITIVE Sensitive     * STAPHYLOCOCCUS SPECIES (COAGULASE NEGATIVE)  Surgical pcr screen     Status: None   Collection Time: 08/04/14  2:45 AM  Result Value Ref Range Status   MRSA, PCR NEGATIVE NEGATIVE Final   Staphylococcus aureus NEGATIVE NEGATIVE Final    Comment:        The Xpert SA Assay (FDA approved for NASAL specimens in patients over 74 years of age), is one component of a comprehensive surveillance program.  Test performance has been validated by [facility] [place] for patients greater than or equal to 31 year old. It is not intended to diagnose infection nor to guide or monitor treatment.      Studies: Ct Head Wo Contrast  08/06/2014   CLINICAL DATA:  Lethargic.  EXAM: CT HEAD WITHOUT CONTRAST  TECHNIQUE: Contiguous axial images were obtained from the base of the skull through the vertex without intravenous contrast.  COMPARISON:  Brain MRI 10/17/2013 and CT 10/16/2013  FINDINGS: Moderate cerebral atrophy is unchanged. There is no evidence of acute cortical infarct, intracranial hemorrhage, mass, midline shift, or extra-axial fluid collection. Periventricular white-matter hypodensities are unchanged and nonspecific but compatible with mild chronic small  vessel ischemic disease. Chronic left caudate lacunar infarct is noted.  Prior bilateral cataract extraction is noted. Mastoid air cells and visualized paranasal sinuses are clear. Mild carotid siphon calcification is noted.  IMPRESSION: No evidence of acute intracranial abnormality.   Electronically Signed   By: Logan Bores   On: 08/06/2014 14:34    Scheduled Meds: . aspirin EC  81 mg Oral Daily  . cefTRIAXone (ROCEPHIN)  IV  2 g Intravenous Q24H  . heparin  5,000 Units Subcutaneous 3 times per day  . metoprolol tartrate  25 mg Oral BID  . potassium chloride  40 mEq Oral BID  . prasugrel  10 mg Oral Daily  . Vilazodone HCl  20 mg Oral  Daily   Continuous Infusions: . dextrose 5 % and 0.9% NaCl 75 mL/hr at 08/07/14 0801    Principal Problem:   Gallstone pancreatitis Active Problems:   Choledocholithiasis with acute cholecystitis   HTN (hypertension)   Cholecystitis   Dementia with behavioral disturbance   Hypokalemia   AKI (acute kidney injury)    Time spent: 30 minutes    Jaquesha Boroff, [facility] Pager [phone]. If 7PM-7AM, please contact night-coverage at [email or web address], password [facility] 08/07/2014, 1:22 PM  LOS: 6 days

## 2014-08-08 LAB — BASIC METABOLIC PANEL
Anion gap: 8 (ref 5–15)
BUN: <5 mg/dL — ABNORMAL LOW (ref 6–20)
CO2: 23 mmol/L (ref 22–32)
Calcium: 8 mg/dL — ABNORMAL LOW (ref 8.9–10.3)
Chloride: 105 mmol/L (ref 101–111)
Creatinine, Ser: 0.68 mg/dL (ref 0.61–1.24)
GFR calc Af Amer: >60 mL/min (ref >60)
Glucose, Bld: 95 mg/dL (ref 70–99)
Potassium: 3.4 mmol/L — ABNORMAL LOW (ref 3.5–5.1)
Sodium: 136 mmol/L (ref 135–145)

## 2014-08-08 LAB — CBC
HCT: 38.6 % — ABNORMAL LOW (ref 39.0–52.0)
HEMOGLOBIN: 12.9 g/dL — AB (ref 13.0–17.0)
MCH: 31.2 pg (ref 26.0–34.0)
MCHC: 33.4 g/dL (ref 30.0–36.0)
MCV: 93.5 fL (ref 78.0–100.0)
PLATELETS: 233 10*3/uL (ref 150–400)
RBC: 4.13 MIL/uL — AB (ref 4.22–5.81)
RDW: 13.8 % (ref 11.5–15.5)
WBC: 13.1 10*3/uL — ABNORMAL HIGH (ref 4.0–10.5)

## 2014-08-08 NOTE — Clinical Social Work Note (Addendum)
Clinical Social Worker provided patient wife with single bed offer from [facility] - patient wife accepting and going to [place] to complete paperwork between 1:30-2:00pm today.  Patient wife understanding that patient will discharge today.  CSW spoke with facility who is agreeable with patient discharge at 3:00pm.  CSW to communicate with RN regarding patient transport.  CSW remains available for support and to facilitate patient discharge needs.  Barbette Or, [place]

## 2014-08-08 NOTE — Progress Notes (Signed)
Patient ID: Tyler Mayo, male   DOB: Feb 16, 1941, 74 y.o.   MRN: [id number]   LOS: 7 days   POD #4  Subjective: Feeling better.   Objective: Vital signs in last 24 hours: Temp:  [98.3 F (36.8 C)-99.6 F (37.6 C)] 99.6 F (37.6 C) (05/01 0808) Pulse Rate:  [99-107] 104 (05/01 0808) Resp:  [17-20] 18 (05/01 0808) BP: (160-169)/(77-104) 160/89 mmHg (05/01 0808) SpO2:  [96 %-99 %] 99 % (05/01 0808) Weight:  [93.4 kg (205 lb 14.6 oz)] 93.4 kg (205 lb 14.6 oz) (04/30 2201) Last BM Date: 08/07/14   Laboratory  CBC  Recent Labs  08/07/14 1633 08/08/14 0740  WBC 12.3* 13.1*  HGB 12.8* 12.9*  HCT 37.3* 38.6*  PLT 231 233   BMET  Recent Labs  08/07/14 0720 08/08/14 0740  NA 138 136  K 3.0* 3.4*  CL 102 105  CO2 25 23  GLUCOSE 103* 95  BUN 5* <5*  CREATININE 0.82 0.68  CALCIUM 7.9* 8.0*    Physical Exam General appearance: alert and no distress Resp: clear to auscultation bilaterally Cardio: regular rate and rhythm GI: Soft, +BS, port sites WNL, minimal if any TTP   Assessment/Plan: POD 4, s/p lap chole with IOC (negative for stone)/acute cholecystitis -regular diet -pulm toilet and mobilize -patient is surgically stable for dc home from our standpoint. His follow up and discharge instruction has been written down and discussed with wife. I see no surgical reason for his mild leukocytosis. Dementia -per med DVT prophylaxis -heparin/SCDs    Lisette Abu, PA-C Pager: [phone] 08/08/2014

## 2014-08-08 NOTE — Discharge Summary (Signed)
Physician Discharge Summary  Tyler Mayo STM:[id number] DOB: [birth year] DOA: 08/01/2014  PCP: Anthoney Harada, MD  Admit date: 08/01/2014 Discharge date: 08/08/2014  Time spent: 35 minutes  Recommendations for Outpatient Follow-up:  Needs to follow up with surgery 5-13 Needs cbc to follow up wbc.  b-met to follow [facility].   Discharge Diagnoses:    Gallstone pancreatitis   Choledocholithiasis with acute cholecystitis   HTN (hypertension)   Cholecystitis   Dementia with behavioral disturbance   Hypokalemia   AKI (acute kidney injury)   Discharge Condition: stable.   Diet recommendation: heart healthy  Filed Weights   08/05/14 2137 08/06/14 2007 08/07/14 2201  Weight: 97.5 kg (214 lb 15.2 oz) 95.1 kg (209 lb 10.5 oz) 93.4 kg (205 lb 14.6 oz)    History of present illness:  Tyler Mayo is a 74 y.o. male h/o dementia, he presents to the ED with c/o severe, sudden onset abdominal pain. Symptoms onset earlier tonight around 7pm, also had N/V with this. Pain is epigastric. Patient had numerous episodes of vomiting at home tonight. Patient recently diagnosed with gallstones on 4/11 and had appointment with surgeon on Tuesday of this week.  Hospital Course:  1-Gallstone pancreatitis ; Cholecystitis.  - GI and CCS following.  -on empiric rocephin and S/P cholecystectomy 4-27 and IOC.  -continue supportive care with antiemetics and as needed analgesics  -patient will be discharge on Augmentin.  -mild leukocytosis; no cough, no diarrhea, no worsening abdominal pain. Clear by surgery for discharge.   2-Encephalopathy. Patient was Lethargic 4-28. Suspect related to opioid. Patient more alert today. Per wife he is still sleeping a lot.  I will decrease Vibryd dose to home dose, 20 mg daily.  Will discontinue morphine. He has not received any more doses.  Klonopin as needed. Resume home dose Vicodin.  CT head negative Back to baseline.   3-HTN: will continue  PRN hydralazine and metoprolol.   4-hx of dementia with behavioral issues: continue PRN klonopin and daily vilazodone  5-hx of CAD: with stent in 2013 -patient effient on hold (for presumably intervention in am) -no CP or SOB -EKG w/o acute ischemic changes on admission -no abnormalities on telemetry -continue B-blocker. One time dose IV , patient unable to take orals.  -ok to resume effient per surgery.   6-Acute on chronic renal failure: stage 3 at baseline -due to pre-renal azotemia and decrease perfusion with spacing most likely -will continue IVF's, -Cr is back to normal (0.88 on 4/26)  7-hypokalemia: will replete with oral supplement.  8-Tachycardia;continue with metoprolol.   Procedures:  cholecystectomy  Consultations:  Surgery  GI  Discharge Exam: Filed Vitals:   08/08/14 0808  BP: 160/89  Pulse: 104  Temp: 99.6 F (37.6 C)  Resp: 18    General: Alert in no distress.  Cardiovascular: S 1, S 2 RRR Respiratory: CTA Abdomen; soft mild tenderness, incision site clean and healing.   Discharge Instructions   Discharge Instructions    Diet - low sodium heart healthy    Complete by:  As directed      Diet - low sodium heart healthy    Complete by:  As directed      Increase activity slowly    Complete by:  As directed      Increase activity slowly    Complete by:  As directed           Current Discharge Medication List    START taking these medications  Details  amoxicillin-clavulanate (AUGMENTIN) 875-125 MG per tablet Take 1 tablet by mouth 2 (two) times daily. Qty: 6 tablet, Refills: 0    potassium chloride SA (K-DUR,KLOR-CON) 20 MEQ tablet Take 2 tablets (40 mEq total) by mouth 2 (two) times daily. Qty: 10 tablet, Refills: 0      CONTINUE these medications which have CHANGED   Details  HYDROcodone-acetaminophen (NORCO/VICODIN) 5-325 MG per tablet Take 1 tablet by mouth every 8 (eight) hours as needed. Qty: 40 tablet, Refills: 0    Associated Diagnoses: Post-op pain    Vilazodone HCl (VIIBRYD) 40 MG TABS Take 0.5 tablets (20 mg total) by mouth daily. Qty: 30 tablet, Refills: 0   Associated Diagnoses: Lymphoma, low grade      CONTINUE these medications which have NOT CHANGED   Details  aspirin EC 81 MG tablet Take 81 mg by mouth daily.    clonazePAM (KLONOPIN) 0.5 MG tablet Take 0.5 mg by mouth 2 (two) times daily as needed for anxiety.     diphenhydrAMINE (BENADRYL) 25 MG tablet Take 25 mg by mouth every 6 (six) hours as needed for itching or allergies.     EFFIENT 10 MG TABS tablet TAKE 1 TABLET (10 MG TOTAL) BY MOUTH DAILY. Qty: 30 tablet, Refills: 5    metoprolol tartrate (LOPRESSOR) 25 MG tablet TAKE 1/2 TABLET (12.5 MG TOTAL) BY MOUTH 2 (TWO) TIMES DAILY. Qty: 30 tablet, Refills: 6    ondansetron (ZOFRAN) 4 MG tablet Take 1 tablet (4 mg total) by mouth every 8 (eight) hours as needed for nausea or vomiting. Qty: 12 tablet, Refills: 0    nitroGLYCERIN (NITROSTAT) 0.4 MG SL tablet Place 1 tablet (0.4 mg total) under the tongue every 5 (five) minutes as needed for chest pain. Qty: 25 tablet, Refills: 4       Allergies  Allergen Reactions  . Ambien [Zolpidem Tartrate] Other (See Comments)    Causes severe confusion   . Aricept [Donepezil Hcl]     Nausea  . Lipitor [Atorvastatin] Other (See Comments)    Joint and muscle pain  . Crestor [Rosuvastatin] Other (See Comments)    Joint pain   Follow-up Information    Follow up with Maia Petties., MD On 08/20/2014.   Specialty:  General Surgery   Why:  11:50am, arrive no later than 11:20am paperwork   Contact information:   [place] [address] [phone]       Follow up with Anthoney Harada, MD.   Specialty:  Family Medicine   Contact information:   [place] [address] [phone]        The results of significant diagnostics from this hospitalization (including imaging, microbiology,  ancillary and laboratory) are listed below for reference.    Significant Diagnostic Studies: Dg Cholangiogram Operative  08/04/2014   CLINICAL DATA:  Laparoscopic cholecystectomy  EXAM: INTRAOPERATIVE CHOLANGIOGRAM  FLUOROSCOPY TIME:  8 seconds  COMPARISON:  MRCP - 08/01/2014; abdominal ultrasound - 08/01/2014  FINDINGS: Intraoperative cholangiographic images of the right upper abdominal quadrant during laparoscopic cholecystectomy are provided for review.  Surgical clips overlie the expected location of the gallbladder fossa.  Contrast injection demonstrates selective cannulation of the central aspect of the cystic duct.  There is passage of contrast through the central aspect of the cystic duct with filling of a non dilated common bile duct. There is passage of contrast though the CBD and into the descending portion of the duodenum.  There is minimal reflux  of injected contrast into the common hepatic duct and central aspect of the non dilated intrahepatic biliary system. There is minimal opacification of central aspect of the pancreatic duct which appears nondilated.  Several bubbles are noted immediately cranial to the insertion of the cystic duct. There are no discrete persistent filling defects within the opacified portions of the biliary system to suggest the presence of choledocholithiasis.  IMPRESSION: No evidence of choledocholithiasis.   Electronically Signed   By: Sandi Mariscal M.D.   On: 08/04/2014 16:44   Ct Head Wo Contrast  08/06/2014   CLINICAL DATA:  Lethargic.  EXAM: CT HEAD WITHOUT CONTRAST  TECHNIQUE: Contiguous axial images were obtained from the base of the skull through the vertex without intravenous contrast.  COMPARISON:  Brain MRI 10/17/2013 and CT 10/16/2013  FINDINGS: Moderate cerebral atrophy is unchanged. There is no evidence of acute cortical infarct, intracranial hemorrhage, mass, midline shift, or extra-axial fluid collection. Periventricular white-matter hypodensities are  unchanged and nonspecific but compatible with mild chronic small vessel ischemic disease. Chronic left caudate lacunar infarct is noted.  Prior bilateral cataract extraction is noted. Mastoid air cells and visualized paranasal sinuses are clear. Mild carotid siphon calcification is noted.  IMPRESSION: No evidence of acute intracranial abnormality.   Electronically Signed   By: Logan Bores   On: 08/06/2014 14:34   Ct Abdomen Pelvis W Contrast  07/15/2014   CLINICAL DATA:  Epigastric and back pain for approximately 1 year. Nausea and vomiting for 2 weeks. Weight loss. Intermittent constipation and diarrhea. History of lung carcinoma  EXAM: CT ABDOMEN AND PELVIS WITH CONTRAST  TECHNIQUE: Multidetector CT imaging of the abdomen and pelvis was performed using the standard protocol following bolus administration of intravenous contrast. Oral contrast was also administered.  CONTRAST:  125 mL Isovue 300 nonionic  COMPARISON:  CT pelvis May 02, 2010  FINDINGS: There is mild atelectasis in the right lung base region. There are foci of coronary artery calcification.  There is diffuse hepatic steatosis. No focal liver lesions are identified. The gallbladder wall appears slightly thickened and subtly edematous. No gallstones are appreciated. There is no biliary duct dilatation.  Spleen, pancreas, and adrenals appear normal.  Kidneys bilaterally show no mass or hydronephrosis on either side. There is no renal or ureteral calculus on either side.  In the pelvis, the urinary bladder is midline. The wall of the urinary bladder is borderline thickened. There are prostatic calculi. There is no pelvic mass or pelvic fluid collection. There is sigmoid diverticulosis without diverticulitis. There is fat in the left inguinal ring. Appendix appears within normal limits. Terminal ileum appears within normal limits.  There is no appreciable bowel obstruction. No free air or portal venous air. There is a small ventral hernia  containing only fat. There is no appreciable ascites, adenopathy, or abscess in the abdomen or pelvis. There is atherosclerotic change in aorta with some mild peripheral thrombus but no aneurysm. The patient is status post kyphoplasty procedure at L1. There is moderate compression of the L1 vertebral body. There is degenerative change at multiple sites in the lumbar spine, most notably at L5-S1. No blastic or lytic bone lesions are identified.  IMPRESSION: Gallbladder wall appears mildly thickened and subtly edematous. Advise correlation with ultrasound to assess for potential acalculus cholecystitis.  Question mild cystitis given slight apparent thickening of the urinary bladder wall.  Sigmoid diverticulosis with mild muscular hypertrophy from the diverticulosis. No frank diverticulitis.  Diffuse hepatic steatosis.  No renal or ureteral  calculus.  No hydronephrosis.  Status post kyphoplasty at L1.  Small ventral hernia containing only fat. There is fat in the left inguinal ring.  There are foci of coronary artery calcification.  No bowel obstruction.  No abscess.  Appendix region appears normal.  These results will be called to the ordering clinician or representative by the Radiologist Assistant, and communication documented in the PACS or zVision Dashboard.   Electronically Signed   By: Lowella Grip III M.D.   On: 07/15/2014 12:28   Mr Abdomen Mrcp Wo Cm  08/01/2014   CLINICAL DATA:  Abdominal pain, nausea and vomiting. Gallstones and borderline gallbladder wall thickening on prior exam.  EXAM: MRI ABDOMEN WITHOUT CONTRAST  (INCLUDING MRCP)  TECHNIQUE: Multiplanar multisequence MR imaging of the abdomen was performed. Heavily T2-weighted images of the biliary and pancreatic ducts were obtained, and three-dimensional MRCP images were rendered by post processing.  COMPARISON:  Abdominal ultrasound 08/01/2014, CT abdomen/pelvis 07/15/2014  FINDINGS: Lower chest:  Lung bases are grossly clear.  Hepatobiliary:  Liver is unremarkable. Dependent gallstones are identified with mild gallbladder wall thickening predominantly at the fundus measuring 7 mm and trace pericholecystic fluid. There is an apparent 2 mm filling defect within the mid common duct image 29 series 9, although this is not reproduced on subsequent thick slab MRCP images for example image 37 series 5, and most likely represents flow artifact. MRCP is suboptimal for characterization of possible stones of this small size, however. Common duct dilatation is identified, with tapering to the level of the ampulla.  Pancreas: The pancreas is ill-defined but no focal mass or pancreatic ductal dilatation is identified allowing for lack of contrast. There is peripancreatic edema/fluid identified, tracking posteriorly, image 28 series 9.  Spleen: Normal  Adrenals/Urinary Tract: The adrenal glands are normal. Kidneys are grossly unremarkable but not fully evaluated on all sequences.  Stomach/Bowel: Bowel is unremarkable.  Vascular/Lymphatic: No lymphadenopathy.  Other: Small amount of ascites.  Musculoskeletal: Normal  IMPRESSION: Gallstones with mild gallbladder wall thickening and trace pericholecystic fluid, highly suspicious for acute cholecystitis. Probable artifactual 2 mm filling defect within the mid common duct, not reproduced on dedicated MRCP images as described above, although the sensitivity and specificity of MRCP is limited for stones of this inherently small potential size. No common duct dilatation is identified.  Edema around the pancreatic tail with indistinctness of the pancreatic body/tail margin. Correlate with amylase and lipase for signs of early pancreatitis.   Electronically Signed   By: Conchita Paris M.D.   On: 08/01/2014 11:15   US Abdomen Limited  08/01/2014   CLINICAL DATA:  Subacute onset of abdominal pain, nausea and vomiting. Initial encounter.  EXAM: US ABDOMEN LIMITED - RIGHT UPPER QUADRANT  COMPARISON:  CT of the abdomen and pelvis  from 07/15/2014, and right upper quadrant ultrasound performed 07/19/2014  FINDINGS: Gallbladder:  Stones and sludge are noted partially filling the gallbladder. The gallbladder wall appears somewhat thickened, but no pericholecystic fluid is seen. No ultrasonographic Murphy's sign is elicited.  Common bile duct:  Diameter: 0.7 cm, within normal limits for the patient's age.  Liver:  No focal lesion identified. Diffusely increased parenchymal echogenicity and coarsened echotexture, likely reflecting fatty infiltration. There is question of trace fluid adjacent to the liver.  IMPRESSION: 1. Cholelithiasis and sludge noted within the gallbladder. Gallbladder wall appears somewhat thickened, without definite evidence for obstruction or cholecystitis. Common bile duct remains normal in caliber. 2. Diffuse fatty infiltration within the liver. 3. Suggestion of  trace ascites adjacent to the liver.   Electronically Signed   By: Garald Balding M.D.   On: 08/01/2014 05:07   US Abdomen Limited Ruq  07/19/2014   CLINICAL DATA:  Right upper quadrant abdominal pain, nausea, vomiting, diarrhea for 2 weeks. Gallbladder wall thickening on prior exam 07/15/2014.  EXAM: US ABDOMEN LIMITED - RIGHT UPPER QUADRANT  COMPARISON:  CT 07/15/2014  FINDINGS: Gallbladder:  Gallbladder wall thickening is identified measuring 6 mm maximally. No pericholecystic fluid. Adherent sludge balls or stones are identified measuring 5 mm maximally. The sonographer reports a sonographic Percell Miller sign is present.  Common bile duct:  Diameter: 3 mm  Liver:  Inhomogeneously echogenic without focal abnormality.  IMPRESSION: Gallbladder wall thickening with adherent stones or sludge balls and technologist reported sonographic Murphy sign. Findings are again highly suspicious for acute cholecystitis.  Inhomogeneously echogenic liver echotexture which may indicate steatosis but other infiltrative processes could appear similar.  These results will be called to  the ordering clinician or representative by the Radiologist Assistant, and communication documented in the PACS or zVision Dashboard.   Electronically Signed   By: Conchita Paris M.D.   On: 07/19/2014 16:00    Microbiology: Recent Results (from the past 240 hour(s))  Surgical pcr screen     Status: None   Collection Time: 08/04/14  2:45 AM  Result Value Ref Range Status   MRSA, PCR NEGATIVE NEGATIVE Final   Staphylococcus aureus NEGATIVE NEGATIVE Final    Comment:        The Xpert SA Assay (FDA approved for NASAL specimens in patients over 11 years of age), is one component of a comprehensive surveillance program.  Test performance has been validated by [facility] for patients greater than or equal to 83 year old. It is not intended to diagnose infection nor to guide or monitor treatment.      Labs: Basic Metabolic Panel:  Recent Labs Lab 08/04/14 1151 08/05/14 0535 08/06/14 0712 08/07/14 0720 08/08/14 0740  NA 137 138 136 138 136  K 3.1* 3.0* 3.1* 3.0* 3.4*  CL 105 100 101 102 105  CO2 24 28 26 25 23   GLUCOSE 98 93 105* 103* 95  BUN 8 <5* 5* 5* <5*  CREATININE 0.72 0.77 0.83 0.82 0.68  CALCIUM 7.8* 8.1* 7.9* 7.9* 8.0*   Liver Function Tests:  Recent Labs Lab 08/02/14 0612 08/03/14 0730 08/04/14 1151 08/05/14 0535 08/06/14 0712  AST 193* 72* 43* 70* 101*  ALT 218* 118* 69* 64* 70*  ALKPHOS 139* 122* 98 108 119*  BILITOT 1.5* 1.4* 1.5* 1.3* 1.4*  PROT 8.3 7.9 6.7 7.2 6.9  ALBUMIN 3.0* 2.7* 2.3* 2.3* 2.2*    Recent Labs Lab 08/01/14 1210 08/02/14 0612 08/03/14 0730  LIPASE 2747* 370* 41   No results for input(s): AMMONIA in the last 168 hours. CBC:  Recent Labs Lab 08/03/14 0730 08/05/14 0535 08/06/14 0712 08/07/14 1633 08/08/14 0740  WBC 8.1 10.3 12.2* 12.3* 13.1*  HGB 13.8 14.0 14.1 12.8* 12.9*  HCT 41.6 42.1 41.8 37.3* 38.6*  MCV 95.6 94.6 94.1 92.6 93.5  PLT 279 239 223 231 233   Cardiac Enzymes: No results for input(s):  CKTOTAL, CKMB, CKMBINDEX, TROPONINI in the last 168 hours. BNP: BNP (last 3 results) No results for input(s): BNP in the last 8760 hours.  ProBNP (last 3 results) No results for input(s): PROBNP in the last 8760 hours.  CBG:  Recent Labs Lab 08/03/14 2056 08/05/14 1401  GLUCAP 92 98  SignedNiel Hummer A  [facility] 08/08/2014, 11:44 AM

## 2014-08-08 NOTE — Clinical Social Work Placement (Signed)
   CLINICAL SOCIAL WORK PLACEMENT  NOTE  Date:  08/08/2014  Patient Details  Name: Tyler Mayo MRN: [id number] Date of Birth: 24-Jun-1940  Clinical Social Work is seeking post-discharge placement for this patient at the [place] level of care (*CSW will initial, date and re-position this form in  chart as items are completed):  Yes   Patient/family provided with [facility] Work Department's list of facilities offering this level of care within the geographic area requested by the patient (or if unable, by the patient's family).  Yes   Patient/family informed of their freedom to choose among providers that offer the needed level of care, that participate in Medicare, Medicaid or managed care program needed by the patient, have an available bed and are willing to accept the patient.  Yes   Patient/family informed of [facility]'s ownership interest in [facility] and [facility], as well as of the fact that they are under no obligation to receive care at these facilities.  PASRR submitted to EDS on 08/07/14     PASRR number received on 08/07/14     Existing PASRR number confirmed on       FL2 transmitted to all facilities in geographic area requested by pt/family on 08/07/14     FL2 transmitted to all facilities within larger geographic area on       Patient informed that his/her managed care company has contracts with or will negotiate with certain facilities, including the following:        Yes   Patient/family informed of bed offers received.  Patient chooses bed at [facility]     Physician recommends and patient chooses bed at      Patient to be transferred to [facility] on 08/08/14.  Patient to be transferred to facility by Ambulance     Patient family notified on 08/08/14 of transfer.  Name of family member notified:  Tyler Mayo (Wife) [phone]     PHYSICIAN Please sign FL2, Please  prepare priority discharge summary, including medications, Please prepare prescriptions     Additional Comment:    _______________________________________________ Malon Kindle, LCSW 08/08/2014, 2:03 PM

## 2014-08-08 NOTE — Clinical Social Work Note (Signed)
Clinical Social Worker facilitated patient discharge including contacting patient family and facility to confirm patient discharge plans.  Clinical information faxed to facility and family agreeable with plan.  CSW arranged ambulance transport via [facility] to [facility] .  RN to call report prior to discharge.  Clinical Social Worker will sign off for now as social work intervention is no longer needed. Please consult us again if new need arises.  Jesse Amun Stemm, LCSW [phone] 

## 2014-08-09 ENCOUNTER — Other Ambulatory Visit: Payer: Self-pay | Admitting: Cardiovascular Disease

## 2014-09-03 ENCOUNTER — Ambulatory Visit: Payer: Self-pay | Admitting: Nurse Practitioner

## 2014-09-03 ENCOUNTER — Other Ambulatory Visit: Payer: Self-pay | Admitting: Family Medicine

## 2014-09-03 ENCOUNTER — Ambulatory Visit
Admission: RE | Admit: 2014-09-03 | Discharge: 2014-09-03 | Disposition: A | Discharge status: Home / Self Care | Payer: Medicare Other | Source: Ambulatory Visit | Attending: Family Medicine | Admitting: Family Medicine

## 2014-09-03 DIAGNOSIS — W19XXXA Unspecified fall, initial encounter: Secondary | ICD-10-CM

## 2014-10-01 ENCOUNTER — Ambulatory Visit: Payer: Self-pay | Admitting: Nurse Practitioner

## 2014-10-04 ENCOUNTER — Other Ambulatory Visit: Payer: Self-pay

## 2014-10-05 ENCOUNTER — Encounter: Payer: Self-pay | Admitting: Internal Medicine

## 2014-10-18 ENCOUNTER — Ambulatory Visit: Payer: Self-pay | Admitting: Nurse Practitioner

## 2014-10-25 ENCOUNTER — Ambulatory Visit: Payer: Medicare Other | Admitting: Thoracic Surgery (Cardiothoracic Vascular Surgery)

## 2015-01-08 DEATH — deceased

## 2015-02-18 ENCOUNTER — Other Ambulatory Visit: Payer: Medicare Other

## 2015-03-07 ENCOUNTER — Ambulatory Visit: Payer: Medicare Other | Admitting: Oncology
